# Patient Record
Sex: Female | Born: 1935 | Race: White | Hispanic: No | Smoking: Never smoker
Health system: Southern US, Community
[De-identification: ages and names within clinical notes are randomized; demographics above are authoritative.]

## PROBLEM LIST (undated history)

## (undated) DIAGNOSIS — M810 Age-related osteoporosis without current pathological fracture: Secondary | ICD-10-CM

## (undated) DIAGNOSIS — I639 Cerebral infarction, unspecified: Secondary | ICD-10-CM

## (undated) DIAGNOSIS — Z9289 Personal history of other medical treatment: Secondary | ICD-10-CM

## (undated) DIAGNOSIS — F419 Anxiety disorder, unspecified: Secondary | ICD-10-CM

## (undated) DIAGNOSIS — M1990 Unspecified osteoarthritis, unspecified site: Secondary | ICD-10-CM

## (undated) DIAGNOSIS — C801 Malignant (primary) neoplasm, unspecified: Secondary | ICD-10-CM

## (undated) DIAGNOSIS — M459 Ankylosing spondylitis of unspecified sites in spine: Secondary | ICD-10-CM

## (undated) HISTORY — PX: ABDOMINAL HYSTERECTOMY: SHX81

## (undated) HISTORY — DX: Cerebral infarction, unspecified: I63.9

---

## 2009-04-25 ENCOUNTER — Emergency Department (HOSPITAL_COMMUNITY): Admission: EM | Admit: 2009-04-25 | Discharge: 2009-04-26 | Payer: Self-pay | Admitting: Emergency Medicine

## 2013-10-05 ENCOUNTER — Emergency Department (HOSPITAL_COMMUNITY): Payer: No Typology Code available for payment source

## 2013-10-05 ENCOUNTER — Encounter (HOSPITAL_COMMUNITY): Payer: Self-pay | Admitting: Emergency Medicine

## 2013-10-05 ENCOUNTER — Inpatient Hospital Stay (HOSPITAL_COMMUNITY)
Admission: EM | Admit: 2013-10-05 | Discharge: 2013-10-12 | DRG: 459 | Disposition: A | Discharge status: Rehabilitation Facility | Payer: No Typology Code available for payment source | Attending: General Surgery | Admitting: General Surgery

## 2013-10-05 DIAGNOSIS — M545 Low back pain, unspecified: Secondary | ICD-10-CM | POA: Diagnosis not present

## 2013-10-05 DIAGNOSIS — Z79899 Other long term (current) drug therapy: Secondary | ICD-10-CM

## 2013-10-05 DIAGNOSIS — S32009A Unspecified fracture of unspecified lumbar vertebra, initial encounter for closed fracture: Secondary | ICD-10-CM

## 2013-10-05 DIAGNOSIS — S065X9A Traumatic subdural hemorrhage with loss of consciousness of unspecified duration, initial encounter: Secondary | ICD-10-CM

## 2013-10-05 DIAGNOSIS — K9189 Other postprocedural complications and disorders of digestive system: Secondary | ICD-10-CM

## 2013-10-05 DIAGNOSIS — Y92410 Unspecified street and highway as the place of occurrence of the external cause: Secondary | ICD-10-CM

## 2013-10-05 DIAGNOSIS — K567 Ileus, unspecified: Secondary | ICD-10-CM | POA: Diagnosis not present

## 2013-10-05 DIAGNOSIS — R000 Tachycardia, unspecified: Secondary | ICD-10-CM | POA: Diagnosis not present

## 2013-10-05 DIAGNOSIS — S065XAA Traumatic subdural hemorrhage with loss of consciousness status unknown, initial encounter: Secondary | ICD-10-CM

## 2013-10-05 DIAGNOSIS — M459 Ankylosing spondylitis of unspecified sites in spine: Secondary | ICD-10-CM | POA: Diagnosis present

## 2013-10-05 DIAGNOSIS — K560 Paralytic ileus: Secondary | ICD-10-CM | POA: Diagnosis not present

## 2013-10-05 DIAGNOSIS — S129XXA Fracture of neck, unspecified, initial encounter: Secondary | ICD-10-CM

## 2013-10-05 DIAGNOSIS — R339 Retention of urine, unspecified: Secondary | ICD-10-CM | POA: Diagnosis not present

## 2013-10-05 DIAGNOSIS — S065X0A Traumatic subdural hemorrhage without loss of consciousness, initial encounter: Secondary | ICD-10-CM | POA: Diagnosis present

## 2013-10-05 DIAGNOSIS — S32029A Unspecified fracture of second lumbar vertebra, initial encounter for closed fracture: Secondary | ICD-10-CM | POA: Diagnosis present

## 2013-10-05 DIAGNOSIS — D62 Acute posthemorrhagic anemia: Secondary | ICD-10-CM | POA: Diagnosis not present

## 2013-10-05 DIAGNOSIS — S32019A Unspecified fracture of first lumbar vertebra, initial encounter for closed fracture: Secondary | ICD-10-CM | POA: Diagnosis present

## 2013-10-05 DIAGNOSIS — J9600 Acute respiratory failure, unspecified whether with hypoxia or hypercapnia: Secondary | ICD-10-CM | POA: Diagnosis present

## 2013-10-05 HISTORY — DX: Ankylosing spondylitis of unspecified sites in spine: M45.9

## 2013-10-05 MED ORDER — MORPHINE SULFATE 4 MG/ML IJ SOLN
4.0000 mg | INTRAMUSCULAR | Status: AC | PRN
  Administered 2013-10-06 (×2): 4 mg via INTRAVENOUS
  Filled 2013-10-05 (×2): qty 1

## 2013-10-05 MED ORDER — IOHEXOL 300 MG/ML  SOLN: 100.0000 mL | Freq: Once | INTRAMUSCULAR | Status: AC | PRN

## 2013-10-05 NOTE — ED Notes (Signed)
Dr.Knapp at bedside  
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
## 2013-10-06 ENCOUNTER — Emergency Department (HOSPITAL_COMMUNITY): Payer: No Typology Code available for payment source

## 2013-10-06 ENCOUNTER — Inpatient Hospital Stay (HOSPITAL_COMMUNITY): Payer: No Typology Code available for payment source | Admitting: Anesthesiology

## 2013-10-06 ENCOUNTER — Inpatient Hospital Stay (HOSPITAL_COMMUNITY): Payer: No Typology Code available for payment source

## 2013-10-06 ENCOUNTER — Encounter (HOSPITAL_COMMUNITY): Payer: No Typology Code available for payment source | Admitting: Anesthesiology

## 2013-10-06 ENCOUNTER — Encounter (HOSPITAL_COMMUNITY): Payer: Self-pay

## 2013-10-06 ENCOUNTER — Encounter (HOSPITAL_COMMUNITY): Admission: EM | Disposition: A | Discharge status: Rehabilitation Facility | Payer: Self-pay | Source: Home / Self Care

## 2013-10-06 DIAGNOSIS — I10 Essential (primary) hypertension: Secondary | ICD-10-CM | POA: Diagnosis not present

## 2013-10-06 DIAGNOSIS — M459 Ankylosing spondylitis of unspecified sites in spine: Secondary | ICD-10-CM | POA: Diagnosis not present

## 2013-10-06 DIAGNOSIS — M545 Low back pain, unspecified: Secondary | ICD-10-CM | POA: Diagnosis present

## 2013-10-06 DIAGNOSIS — D62 Acute posthemorrhagic anemia: Secondary | ICD-10-CM | POA: Diagnosis not present

## 2013-10-06 DIAGNOSIS — S065X9A Traumatic subdural hemorrhage with loss of consciousness of unspecified duration, initial encounter: Secondary | ICD-10-CM

## 2013-10-06 DIAGNOSIS — S129XXA Fracture of neck, unspecified, initial encounter: Secondary | ICD-10-CM

## 2013-10-06 DIAGNOSIS — Y92410 Unspecified street and highway as the place of occurrence of the external cause: Secondary | ICD-10-CM | POA: Diagnosis not present

## 2013-10-06 DIAGNOSIS — S32009A Unspecified fracture of unspecified lumbar vertebra, initial encounter for closed fracture: Secondary | ICD-10-CM | POA: Diagnosis present

## 2013-10-06 DIAGNOSIS — Z79899 Other long term (current) drug therapy: Secondary | ICD-10-CM | POA: Diagnosis not present

## 2013-10-06 DIAGNOSIS — R000 Tachycardia, unspecified: Secondary | ICD-10-CM | POA: Diagnosis not present

## 2013-10-06 DIAGNOSIS — F411 Generalized anxiety disorder: Secondary | ICD-10-CM | POA: Diagnosis not present

## 2013-10-06 DIAGNOSIS — D72829 Elevated white blood cell count, unspecified: Secondary | ICD-10-CM | POA: Diagnosis not present

## 2013-10-06 DIAGNOSIS — S065X0A Traumatic subdural hemorrhage without loss of consciousness, initial encounter: Secondary | ICD-10-CM | POA: Diagnosis present

## 2013-10-06 DIAGNOSIS — Z5189 Encounter for other specified aftercare: Secondary | ICD-10-CM | POA: Diagnosis present

## 2013-10-06 DIAGNOSIS — K560 Paralytic ileus: Secondary | ICD-10-CM | POA: Diagnosis not present

## 2013-10-06 DIAGNOSIS — S065XAA Traumatic subdural hemorrhage with loss of consciousness status unknown, initial encounter: Secondary | ICD-10-CM | POA: Diagnosis not present

## 2013-10-06 DIAGNOSIS — R339 Retention of urine, unspecified: Secondary | ICD-10-CM | POA: Diagnosis not present

## 2013-10-06 DIAGNOSIS — M62838 Other muscle spasm: Secondary | ICD-10-CM | POA: Diagnosis not present

## 2013-10-06 DIAGNOSIS — G5790 Unspecified mononeuropathy of unspecified lower limb: Secondary | ICD-10-CM | POA: Diagnosis not present

## 2013-10-06 HISTORY — PX: POSTERIOR LUMBAR FUSION: SHX6036

## 2013-10-06 HISTORY — PX: CRANIOTOMY: SHX93

## 2013-10-06 LAB — CBC
HCT: 36.4 % (ref 36.0–46.0)
HEMOGLOBIN: 12.1 g/dL (ref 12.0–15.0)
MCH: 29.4 pg (ref 26.0–34.0)
MCHC: 33.2 g/dL (ref 30.0–36.0)
MCV: 88.3 fL (ref 78.0–100.0)
Platelets: 225 10*3/uL (ref 150–400)
RBC: 4.12 MIL/uL (ref 3.87–5.11)
RDW: 14.3 % (ref 11.5–15.5)
WBC: 14.6 10*3/uL — AB (ref 4.0–10.5)

## 2013-10-06 LAB — URINALYSIS, ROUTINE W REFLEX MICROSCOPIC
Bilirubin Urine: NEGATIVE
Glucose, UA: NEGATIVE mg/dL
Hgb urine dipstick: NEGATIVE
Ketones, ur: NEGATIVE mg/dL
LEUKOCYTES UA: NEGATIVE
NITRITE: NEGATIVE
PH: 6 (ref 5.0–8.0)
Protein, ur: NEGATIVE mg/dL
Specific Gravity, Urine: 1.024 (ref 1.005–1.030)
Urobilinogen, UA: 0.2 mg/dL (ref 0.0–1.0)

## 2013-10-06 LAB — CBC WITH DIFFERENTIAL/PLATELET
Basophils Absolute: 0.1 10*3/uL (ref 0.0–0.1)
Basophils Relative: 1 % (ref 0–1)
EOS PCT: 1 % (ref 0–5)
Eosinophils Absolute: 0.1 10*3/uL (ref 0.0–0.7)
HCT: 42.1 % (ref 36.0–46.0)
Hemoglobin: 14.1 g/dL (ref 12.0–15.0)
LYMPHS PCT: 33 % (ref 12–46)
Lymphs Abs: 4.1 10*3/uL — ABNORMAL HIGH (ref 0.7–4.0)
MCH: 29.1 pg (ref 26.0–34.0)
MCHC: 33.5 g/dL (ref 30.0–36.0)
MCV: 86.8 fL (ref 78.0–100.0)
Monocytes Absolute: 0.9 10*3/uL (ref 0.1–1.0)
Monocytes Relative: 7 % (ref 3–12)
NEUTROS PCT: 58 % (ref 43–77)
Neutro Abs: 7.2 10*3/uL (ref 1.7–7.7)
PLATELETS: 269 10*3/uL (ref 150–400)
RBC: 4.85 MIL/uL (ref 3.87–5.11)
RDW: 14.2 % (ref 11.5–15.5)
WBC: 12.3 10*3/uL — AB (ref 4.0–10.5)

## 2013-10-06 LAB — POCT I-STAT 3, ART BLOOD GAS (G3+)
ACID-BASE DEFICIT: 4 mmol/L — AB (ref 0.0–2.0)
BICARBONATE: 23 meq/L (ref 20.0–24.0)
O2 Saturation: 99 %
PH ART: 7.264 — AB (ref 7.350–7.450)
TCO2: 25 mmol/L (ref 0–100)
pCO2 arterial: 50.8 mmHg — ABNORMAL HIGH (ref 35.0–45.0)
pO2, Arterial: 153 mmHg — ABNORMAL HIGH (ref 80.0–100.0)

## 2013-10-06 LAB — I-STAT CHEM 8, ED
BUN: 26 mg/dL — ABNORMAL HIGH (ref 6–23)
Calcium, Ion: 1.08 mmol/L — ABNORMAL LOW (ref 1.13–1.30)
Chloride: 102 mEq/L (ref 96–112)
Creatinine, Ser: 0.8 mg/dL (ref 0.50–1.10)
Glucose, Bld: 202 mg/dL — ABNORMAL HIGH (ref 70–99)
HCT: 45 % (ref 36.0–46.0)
Hemoglobin: 15.3 g/dL — ABNORMAL HIGH (ref 12.0–15.0)
POTASSIUM: 3.4 meq/L — AB (ref 3.7–5.3)
SODIUM: 139 meq/L (ref 137–147)
TCO2: 25 mmol/L (ref 0–100)

## 2013-10-06 LAB — BASIC METABOLIC PANEL
ANION GAP: 12 (ref 5–15)
BUN: 19 mg/dL (ref 6–23)
CALCIUM: 7 mg/dL — AB (ref 8.4–10.5)
CHLORIDE: 108 meq/L (ref 96–112)
CO2: 22 mEq/L (ref 19–32)
Creatinine, Ser: 0.49 mg/dL — ABNORMAL LOW (ref 0.50–1.10)
GFR calc Af Amer: >90 mL/min (ref >90)
GFR calc non Af Amer: >90 mL/min (ref >90)
GLUCOSE: 198 mg/dL — AB (ref 70–99)
Potassium: 4.5 mEq/L (ref 3.7–5.3)
SODIUM: 142 meq/L (ref 137–147)

## 2013-10-06 LAB — TYPE AND SCREEN
ABO/RH(D): AB NEG
Antibody Screen: NEGATIVE

## 2013-10-06 LAB — MRSA PCR SCREENING
MRSA BY PCR: NEGATIVE
MRSA by PCR: NEGATIVE

## 2013-10-06 LAB — APTT: aPTT: 22 seconds — ABNORMAL LOW (ref 24–37)

## 2013-10-06 LAB — PROTIME-INR
INR: 0.89 (ref 0.00–1.49)
Prothrombin Time: 12.1 seconds (ref 11.6–15.2)

## 2013-10-06 SURGERY — CRANIOTOMY HEMATOMA EVACUATION SUBDURAL
Anesthesia: General | Site: Spine Lumbar | Laterality: Right

## 2013-10-06 MED ORDER — PANTOPRAZOLE SODIUM 40 MG PO TBEC
40.0000 mg | DELAYED_RELEASE_TABLET | Freq: Every day | ORAL | Status: DC
  Administered 2013-10-10 – 2013-10-11 (×2): 40 mg via ORAL
  Filled 2013-10-06 (×2): qty 1

## 2013-10-06 MED ORDER — ONDANSETRON HCL 4 MG/2ML IJ SOLN: 4.0000 mg | Freq: Four times a day (QID) | INTRAMUSCULAR | Status: DC | PRN

## 2013-10-06 MED ORDER — SUCCINYLCHOLINE CHLORIDE 20 MG/ML IJ SOLN
INTRAMUSCULAR | Status: DC | PRN
  Administered 2013-10-06: 140 mg via INTRAVENOUS

## 2013-10-06 MED ORDER — ALBUMIN HUMAN 5 % IV SOLN
INTRAVENOUS | Status: DC | PRN
  Administered 2013-10-06: 07:00:00 via INTRAVENOUS

## 2013-10-06 MED ORDER — SODIUM CHLORIDE 0.9 % IJ SOLN: 3.0000 mL | INTRAMUSCULAR | Status: DC | PRN

## 2013-10-06 MED ORDER — MORPHINE SULFATE 2 MG/ML IJ SOLN
1.0000 mg | INTRAMUSCULAR | Status: DC | PRN
  Administered 2013-10-06: 1 mg via INTRAVENOUS
  Administered 2013-10-07: 2 mg via INTRAVENOUS
  Administered 2013-10-07: 1 mg via INTRAVENOUS
  Filled 2013-10-06 (×3): qty 1

## 2013-10-06 MED ORDER — PROPOFOL 10 MG/ML IV BOLUS
INTRAVENOUS | Status: DC | PRN
  Administered 2013-10-06 (×2): 50 mg via INTRAVENOUS

## 2013-10-06 MED ORDER — 0.9 % SODIUM CHLORIDE (POUR BTL) OPTIME
TOPICAL | Status: DC | PRN
  Administered 2013-10-06 (×3): 1000 mL

## 2013-10-06 MED ORDER — NEOSTIGMINE METHYLSULFATE 10 MG/10ML IV SOLN
INTRAVENOUS | Status: DC | PRN
  Administered 2013-10-06: 4 mg via INTRAVENOUS

## 2013-10-06 MED ORDER — PHENYLEPHRINE HCL 10 MG/ML IJ SOLN
10.0000 mg | INTRAVENOUS | Status: DC | PRN
  Administered 2013-10-06: 25 ug/min via INTRAVENOUS

## 2013-10-06 MED ORDER — THROMBIN 20000 UNITS EX SOLR
CUTANEOUS | Status: DC | PRN
  Administered 2013-10-06 (×2): via TOPICAL

## 2013-10-06 MED ORDER — DEXAMETHASONE SODIUM PHOSPHATE 4 MG/ML IJ SOLN
INTRAMUSCULAR | Status: DC | PRN
  Administered 2013-10-06: 8 mg via INTRAVENOUS

## 2013-10-06 MED ORDER — ETOMIDATE 2 MG/ML IV SOLN
INTRAVENOUS | Status: DC | PRN
  Administered 2013-10-06: 20 mg via INTRAVENOUS

## 2013-10-06 MED ORDER — ONDANSETRON HCL 4 MG/2ML IJ SOLN: 4.0000 mg | INTRAMUSCULAR | Status: DC | PRN

## 2013-10-06 MED ORDER — FENTANYL CITRATE 0.05 MG/ML IJ SOLN
INTRAMUSCULAR | Status: DC | PRN
  Administered 2013-10-06 (×2): 100 ug via INTRAVENOUS
  Administered 2013-10-06: 50 ug via INTRAVENOUS
  Administered 2013-10-06: 100 ug via INTRAVENOUS
  Administered 2013-10-06: 150 ug via INTRAVENOUS

## 2013-10-06 MED ORDER — POTASSIUM CHLORIDE IN NACL 20-0.9 MEQ/L-% IV SOLN
INTRAVENOUS | Status: DC
  Administered 2013-10-06 – 2013-10-10 (×6): via INTRAVENOUS
  Filled 2013-10-06 (×9): qty 1000

## 2013-10-06 MED ORDER — IOHEXOL 300 MG/ML  SOLN
100.0000 mL | Freq: Once | INTRAMUSCULAR | Status: AC | PRN
  Administered 2013-10-06: 100 mL via INTRAVENOUS

## 2013-10-06 MED ORDER — BACITRACIN 50000 UNITS IM SOLR
INTRAMUSCULAR | Status: DC | PRN
  Administered 2013-10-06 (×2)

## 2013-10-06 MED ORDER — CEFAZOLIN SODIUM-DEXTROSE 2-3 GM-% IV SOLR
INTRAVENOUS | Status: DC | PRN
  Administered 2013-10-06: 2 g via INTRAVENOUS

## 2013-10-06 MED ORDER — MICROFIBRILLAR COLL HEMOSTAT EX PADS
MEDICATED_PAD | CUTANEOUS | Status: DC | PRN
  Administered 2013-10-06: 1 via TOPICAL

## 2013-10-06 MED ORDER — SODIUM CHLORIDE 0.9 % IV SOLN: 250.0000 mL | INTRAVENOUS | Status: DC

## 2013-10-06 MED ORDER — VECURONIUM BROMIDE 10 MG IV SOLR
INTRAVENOUS | Status: DC | PRN
  Administered 2013-10-06 (×3): 5 mg via INTRAVENOUS

## 2013-10-06 MED ORDER — GLYCOPYRROLATE 0.2 MG/ML IJ SOLN
INTRAMUSCULAR | Status: DC | PRN
  Administered 2013-10-06: .8 mg via INTRAVENOUS

## 2013-10-06 MED ORDER — LIDOCAINE HCL (CARDIAC) 20 MG/ML IV SOLN
INTRAVENOUS | Status: DC | PRN
  Administered 2013-10-06: 80 mg via INTRAVENOUS

## 2013-10-06 MED ORDER — CHLORHEXIDINE GLUCONATE 0.12 % MT SOLN
15.0000 mL | Freq: Two times a day (BID) | OROMUCOSAL | Status: DC
  Administered 2013-10-06: 15 mL via OROMUCOSAL

## 2013-10-06 MED ORDER — DEXMEDETOMIDINE HCL IN NACL 200 MCG/50ML IV SOLN
INTRAVENOUS | Status: DC | PRN
  Administered 2013-10-06: 0.5 ug/kg/h via INTRAVENOUS

## 2013-10-06 MED ORDER — DEXTROSE 5 % IV SOLN
INTRAVENOUS | Status: DC | PRN
  Administered 2013-10-06: 05:00:00 via INTRAVENOUS

## 2013-10-06 MED ORDER — ONDANSETRON HCL 4 MG PO TABS
4.0000 mg | ORAL_TABLET | Freq: Four times a day (QID) | ORAL | Status: DC | PRN
Start: 2013-10-06 — End: 2013-10-07

## 2013-10-06 MED ORDER — SODIUM CHLORIDE 0.9 % IV SOLN
INTRAVENOUS | Status: DC | PRN
  Administered 2013-10-06: 04:00:00 via INTRAVENOUS

## 2013-10-06 MED ORDER — ALUM & MAG HYDROXIDE-SIMETH 200-200-20 MG/5ML PO SUSP: 30.0000 mL | Freq: Four times a day (QID) | ORAL | Status: DC | PRN

## 2013-10-06 MED ORDER — CHLORHEXIDINE GLUCONATE 0.12 % MT SOLN
OROMUCOSAL | Status: AC
  Filled 2013-10-06: qty 15

## 2013-10-06 MED ORDER — ACETAMINOPHEN 650 MG RE SUPP
650.0000 mg | RECTAL | Status: DC | PRN
  Administered 2013-10-06: 650 mg via RECTAL
  Filled 2013-10-06: qty 1

## 2013-10-06 MED ORDER — BUPIVACAINE HCL (PF) 0.5 % IJ SOLN
INTRAMUSCULAR | Status: DC | PRN
  Administered 2013-10-06 (×2): 5 mL

## 2013-10-06 MED ORDER — LIDOCAINE-EPINEPHRINE 1 %-1:100000 IJ SOLN
INTRAMUSCULAR | Status: DC | PRN
  Administered 2013-10-06 (×2): 5 mL via INTRADERMAL

## 2013-10-06 MED ORDER — MENTHOL 3 MG MT LOZG: 1.0000 | LOZENGE | OROMUCOSAL | Status: DC | PRN

## 2013-10-06 MED ORDER — LEVETIRACETAM IN NACL 500 MG/100ML IV SOLN
500.0000 mg | Freq: Once | INTRAVENOUS | Status: AC
  Administered 2013-10-06: 500 mg via INTRAVENOUS
  Filled 2013-10-06: qty 100

## 2013-10-06 MED ORDER — PANTOPRAZOLE SODIUM 40 MG IV SOLR
40.0000 mg | Freq: Every day | INTRAVENOUS | Status: DC
  Administered 2013-10-06 – 2013-10-09 (×4): 40 mg via INTRAVENOUS
  Filled 2013-10-06 (×5): qty 40

## 2013-10-06 MED ORDER — DEXMEDETOMIDINE HCL IN NACL 200 MCG/50ML IV SOLN
0.4000 ug/kg/h | INTRAVENOUS | Status: DC
  Filled 2013-10-06: qty 50

## 2013-10-06 MED ORDER — SODIUM CHLORIDE 0.9 % IV SOLN
INTRAVENOUS | Status: DC | PRN
  Administered 2013-10-06: 05:00:00 via INTRAVENOUS

## 2013-10-06 MED ORDER — HYDROMORPHONE HCL PF 1 MG/ML IJ SOLN
0.5000 mg | INTRAMUSCULAR | Status: DC | PRN
  Administered 2013-10-06 – 2013-10-07 (×5): 0.5 mg via INTRAVENOUS
  Filled 2013-10-06 (×6): qty 1

## 2013-10-06 MED ORDER — PHENOL 1.4 % MT LIQD: 1.0000 | OROMUCOSAL | Status: DC | PRN

## 2013-10-06 MED ORDER — ACETAMINOPHEN 325 MG PO TABS: 650.0000 mg | ORAL_TABLET | ORAL | Status: DC | PRN

## 2013-10-06 MED ORDER — CETYLPYRIDINIUM CHLORIDE 0.05 % MT LIQD: 7.0000 mL | Freq: Four times a day (QID) | OROMUCOSAL | Status: DC

## 2013-10-06 MED ORDER — OXYCODONE-ACETAMINOPHEN 5-325 MG PO TABS: 1.0000 | ORAL_TABLET | ORAL | Status: DC | PRN

## 2013-10-06 MED ORDER — CEFAZOLIN SODIUM 1-5 GM-% IV SOLN
1.0000 g | Freq: Three times a day (TID) | INTRAVENOUS | Status: AC
  Administered 2013-10-06 (×2): 1 g via INTRAVENOUS
  Filled 2013-10-06 (×2): qty 50

## 2013-10-06 MED ORDER — SODIUM CHLORIDE 0.9 % IJ SOLN
3.0000 mL | Freq: Two times a day (BID) | INTRAMUSCULAR | Status: DC
  Administered 2013-10-06 – 2013-10-09 (×5): 3 mL via INTRAVENOUS

## 2013-10-06 MED ORDER — ARTIFICIAL TEARS OP OINT
TOPICAL_OINTMENT | OPHTHALMIC | Status: DC | PRN
  Administered 2013-10-06: 1 via OPHTHALMIC

## 2013-10-06 MED ORDER — ONDANSETRON HCL 4 MG/2ML IJ SOLN
INTRAMUSCULAR | Status: DC | PRN
  Administered 2013-10-06: 4 mg via INTRAVENOUS

## 2013-10-06 SURGICAL SUPPLY — 96 items
ADH SKN CLS APL DERMABOND .7 (GAUZE/BANDAGES/DRESSINGS) ×2
ADH SKN CLS LQ APL DERMABOND (GAUZE/BANDAGES/DRESSINGS) ×2
BAG DECANTER FOR FLEXI CONT (MISCELLANEOUS) ×8 IMPLANT
BIT DRILL WIRE PASS 1.3MM (BIT) IMPLANT
BNDG GAUZE ELAST 4 BULKY (GAUZE/BANDAGES/DRESSINGS) ×2 IMPLANT
BRUSH SCRUB EZ PLAIN DRY (MISCELLANEOUS) ×2 IMPLANT
BUR ACORN 6.0 (BURR) ×3 IMPLANT
BUR ACORN 6.0MM (BURR) ×1
BUR ADDG 1.1 (BURR) IMPLANT
BUR ADDG 1.1MM (BURR)
BUR ROUTER D-58 CRANI (BURR) ×2 IMPLANT
CANISTER SUCT 3000ML (MISCELLANEOUS) ×4 IMPLANT
CAP REVERE LOCKING (Cap) ×8 IMPLANT
CLIP TI MEDIUM 6 (CLIP) IMPLANT
CONT SPEC 4OZ CLIKSEAL STRL BL (MISCELLANEOUS) ×4 IMPLANT
CONT SPEC STER OR (MISCELLANEOUS) ×6 IMPLANT
CORDS BIPOLAR (ELECTRODE) ×4 IMPLANT
COVER TABLE BACK 60X90 (DRAPES) ×2 IMPLANT
DECANTER SPIKE VIAL GLASS SM (MISCELLANEOUS) ×4 IMPLANT
DERMABOND ADHESIVE PROPEN (GAUZE/BANDAGES/DRESSINGS) ×2
DERMABOND ADVANCED (GAUZE/BANDAGES/DRESSINGS) ×2
DERMABOND ADVANCED .7 DNX12 (GAUZE/BANDAGES/DRESSINGS) IMPLANT
DERMABOND ADVANCED .7 DNX6 (GAUZE/BANDAGES/DRESSINGS) IMPLANT
DRAIN CHANNEL 10M FLAT 3/4 FLT (DRAIN) IMPLANT
DRAIN PENROSE 1/2X12 LTX STRL (WOUND CARE) IMPLANT
DRAPE C-ARM 42X72 X-RAY (DRAPES) ×3 IMPLANT
DRAPE C-ARMOR (DRAPES) ×2 IMPLANT
DRAPE LAPAROTOMY 100X72X124 (DRAPES) ×2 IMPLANT
DRAPE POUCH INSTRU U-SHP 10X18 (DRAPES) ×2 IMPLANT
DRAPE SURG IRRIG POUCH 19X23 (DRAPES) IMPLANT
DRAPE WARM FLUID 44X44 (DRAPE) ×4 IMPLANT
DRILL WIRE PASS 1.3MM (BIT)
DRSG ADAPTIC 3X8 NADH LF (GAUZE/BANDAGES/DRESSINGS) IMPLANT
DRSG OPSITE POSTOP 3X4 (GAUZE/BANDAGES/DRESSINGS) ×3 IMPLANT
DRSG OPSITE POSTOP 4X10 (GAUZE/BANDAGES/DRESSINGS) ×4 IMPLANT
DRSG OPSITE POSTOP 4X6 (GAUZE/BANDAGES/DRESSINGS) ×3 IMPLANT
DURAPREP 26ML APPLICATOR (WOUND CARE) ×6 IMPLANT
DURAPREP 6ML APPLICATOR 50/CS (WOUND CARE) ×4 IMPLANT
ELECT CAUTERY BLADE 6.4 (BLADE) ×4 IMPLANT
ELECT REM PT RETURN 9FT ADLT (ELECTROSURGICAL) ×4
ELECTRODE REM PT RTRN 9FT ADLT (ELECTROSURGICAL) ×2 IMPLANT
EVACUATOR SILICONE 100CC (DRAIN) IMPLANT
GAUZE SPONGE 4X4 12PLY STRL (GAUZE/BANDAGES/DRESSINGS) IMPLANT
GAUZE SPONGE 4X4 16PLY XRAY LF (GAUZE/BANDAGES/DRESSINGS) IMPLANT
GLOVE BIO SURGEON STRL SZ 6.5 (GLOVE) ×1 IMPLANT
GLOVE BIO SURGEON STRL SZ7 (GLOVE) ×6 IMPLANT
GLOVE BIO SURGEON STRL SZ7.5 (GLOVE) IMPLANT
GLOVE BIO SURGEONS STRL SZ 6.5 (GLOVE) ×1
GLOVE BIOGEL PI IND STRL 7.5 (GLOVE) ×4 IMPLANT
GLOVE BIOGEL PI IND STRL 8.5 (GLOVE) ×2 IMPLANT
GLOVE BIOGEL PI INDICATOR 7.5 (GLOVE) ×4
GLOVE BIOGEL PI INDICATOR 8.5 (GLOVE) ×4
GLOVE ECLIPSE 8.5 STRL (GLOVE) ×6 IMPLANT
GLOVE EXAM NITRILE LRG STRL (GLOVE) IMPLANT
GLOVE EXAM NITRILE MD LF STRL (GLOVE) IMPLANT
GLOVE EXAM NITRILE XL STR (GLOVE) IMPLANT
GLOVE EXAM NITRILE XS STR PU (GLOVE) IMPLANT
GOWN STRL REUS W/ TWL LRG LVL3 (GOWN DISPOSABLE) IMPLANT
GOWN STRL REUS W/ TWL XL LVL3 (GOWN DISPOSABLE) ×2 IMPLANT
GOWN STRL REUS W/TWL 2XL LVL3 (GOWN DISPOSABLE) ×4 IMPLANT
GOWN STRL REUS W/TWL LRG LVL3 (GOWN DISPOSABLE) ×12
GOWN STRL REUS W/TWL XL LVL3 (GOWN DISPOSABLE) ×8
HEMOSTAT SURGICEL 2X14 (HEMOSTASIS) IMPLANT
HOOK DURA (MISCELLANEOUS) ×4 IMPLANT
KIT BASIN OR (CUSTOM PROCEDURE TRAY) ×6 IMPLANT
KIT ROOM TURNOVER OR (KITS) ×4 IMPLANT
NEEDLE HYPO 22GX1.5 SAFETY (NEEDLE) ×8 IMPLANT
NS IRRIG 1000ML POUR BTL (IV SOLUTION) ×4 IMPLANT
PACK CRANIOTOMY (CUSTOM PROCEDURE TRAY) ×4 IMPLANT
PACK LAMINECTOMY NEURO (CUSTOM PROCEDURE TRAY) ×3 IMPLANT
PAD ABD 8X10 STRL (GAUZE/BANDAGES/DRESSINGS) IMPLANT
PAD ARMBOARD 7.5X6 YLW CONV (MISCELLANEOUS) ×18 IMPLANT
PATTIES SURGICAL .5 X.5 (GAUZE/BANDAGES/DRESSINGS) IMPLANT
PATTIES SURGICAL .5 X3 (DISPOSABLE) IMPLANT
PATTIES SURGICAL 1X1 (DISPOSABLE) IMPLANT
PIN MAYFIELD SKULL DISP (PIN) IMPLANT
ROD STRAIGHT 6.35X40MM (Rod) ×4 IMPLANT
SCREW REVERE 6.35 6.5X40MM (Screw) ×8 IMPLANT
SPECIMEN JAR SMALL (MISCELLANEOUS) IMPLANT
SPONGE NEURO XRAY DETECT 1X3 (DISPOSABLE) IMPLANT
SPONGE SURGIFOAM ABS GEL 100 (HEMOSTASIS) IMPLANT
STAPLER SKIN PROX WIDE 3.9 (STAPLE) ×6 IMPLANT
STRIP BIOACTIVE 10CC 25X50X8 (Miscellaneous) ×2 IMPLANT
SUT ETHILON 3 0 FSL (SUTURE) IMPLANT
SUT NURALON 4 0 TR CR/8 (SUTURE) ×8 IMPLANT
SUT VIC AB 2-0 CP2 18 (SUTURE) ×10 IMPLANT
SUT VIC AB 2-0 CT1 18 (SUTURE) ×2 IMPLANT
SUT VIC AB 3-0 SH 18 (SUTURE) ×4 IMPLANT
SYR 20ML ECCENTRIC (SYRINGE) ×4 IMPLANT
SYR CONTROL 10ML LL (SYRINGE) ×6 IMPLANT
TOWEL OR 17X24 6PK STRL BLUE (TOWEL DISPOSABLE) ×6 IMPLANT
TOWEL OR 17X26 10 PK STRL BLUE (TOWEL DISPOSABLE) ×7 IMPLANT
TRAP SPECIMEN MUCOUS 40CC (MISCELLANEOUS) IMPLANT
TRAY FOLEY CATH 14FRSI W/METER (CATHETERS) IMPLANT
UNDERPAD 30X30 INCONTINENT (UNDERPADS AND DIAPERS) IMPLANT
WATER STERILE IRR 1000ML POUR (IV SOLUTION) ×4 IMPLANT

## 2013-10-06 NOTE — Progress Notes (Signed)
10/06/2013- Resp care note- Pt suctioned and extubated at 1300 as per MD.  Dr Linna Caprice at bedside for procedure along with RN.  Pt able to follow commands.  Post extubation pt vocalizing.  AN19, rr17, sats 98% on 2lpm cannula.  Minimal to no secretions prior to extubation.  Ventilator and ambu bag at bedside. Pt tolerated procedure well. Will follow pt progress.

## 2013-10-06 NOTE — Plan of Care (Signed)
Problem: Consults Goal: Diagnosis - Craniotomy Outcome: Completed/Met Date Met:  10/06/13 Subdural hematoma

## 2013-10-06 NOTE — Transfer of Care (Signed)
Immediate Anesthesia Transfer of Care Note  Patient: Erica [name]  Procedure(s) Performed: Procedure(s): CRANIECTOMY HEMATOMA EVACUATION SUBDURAL, PLACEMENT OF SKULL FLAP IN ABDOMEN (Right) POSTERIOR LUMBAR FUSION  lumbar one/two (N/A)  Patient Location: ICU  Anesthesia Type:General  Level of Consciousness: Patient remains intubated per anesthesia plan  Airway & Oxygen Therapy: Patient remains intubated per anesthesia plan and Patient placed on Ventilator (see vital sign flow sheet for setting)  Post-op Assessment: Report given to PACU RN and Post -op Vital signs reviewed and stable  Post vital signs: Reviewed and stable  Complications: No apparent anesthesia complications
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
## 2013-10-06 NOTE — Anesthesia Preprocedure Evaluation (Signed)
Anesthesia Evaluation  Patient identified by MRN, date of birth, ID band Patient awake    Reviewed: Allergy & Precautions, H&P , NPO status , Patient's Chart, lab work & pertinent test results  History of Anesthesia Complications Negative for: history of anesthetic complications  Airway Mallampati: II TM Distance: >3 FB Neck ROM: Full    Dental  (+) Teeth Intact, Dental Advisory Given   Pulmonary neg pulmonary ROS,    Pulmonary exam normal       Cardiovascular negative cardio ROS      Neuro/Psych negative psych ROS   GI/Hepatic negative GI ROS, Neg liver ROS,   Endo/Other  negative endocrine ROS  Renal/GU negative Renal ROS     Musculoskeletal   Abdominal   Peds  Hematology negative hematology ROS (+)   Anesthesia Other Findings   Reproductive/Obstetrics                           Anesthesia Physical Anesthesia Plan  ASA: III and emergent  Anesthesia Plan: General   Post-op Pain Management:    Induction: Intravenous  Airway Management Planned: Oral ETT and Video Laryngoscope Planned  Additional Equipment: Arterial line  Intra-op Plan:   Post-operative Plan: Possible Post-op intubation/ventilation  Informed Consent: I have reviewed the patients History and Physical, chart, labs and discussed the procedure including the risks, benefits and alternatives for the proposed anesthesia with the patient or authorized representative who has indicated his/her understanding and acceptance.   Dental advisory given  Plan Discussed with: CRNA, Anesthesiologist and Surgeon  Anesthesia Plan Comments:         Anesthesia Quick Evaluation

## 2013-10-06 NOTE — Anesthesia Postprocedure Evaluation (Signed)
  Anesthesia Post-op Note  Patient: Erica [name]  Procedure(s) Performed: Procedure(s): CRANIECTOMY HEMATOMA EVACUATION SUBDURAL, PLACEMENT OF SKULL FLAP IN ABDOMEN (Right) POSTERIOR LUMBAR FUSION  lumbar one/two (N/A)  Patient Location: NICU  Anesthesia Type:General  Level of Consciousness: awake, sedated and Patient remains intubated per anesthesia plan  Airway and Oxygen Therapy: Patient remains intubated per anesthesia plan and Patient placed on Ventilator (see vital sign flow sheet for setting)  Post-op Pain: none  Post-op Assessment: Post-op Vital signs reviewed, Patient's Cardiovascular Status Stable, Respiratory Function Stable, Patent Airway and Pain level controlled  Post-op Vital Signs: stable  Last Vitals:  Filed Vitals:   10/06/13 0800  BP: 142/71  Pulse: 89  Temp:   Resp: 18    Complications: No apparent anesthesia complications
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
## 2013-10-06 NOTE — Progress Notes (Signed)
Patient has just returned to the ICU after craniectomy/craniotomy, SDH evacuation, and L1-2 stabilization by Dr. Ellene Route.  She has been very stable throughout the operation and should be extubated shortly.  Will check CXR and some labs.  This patient has been seen and I agree with the findings and treatment plan.  Kathryne Eriksson. Dahlia Bailiff, MD, [place] [phone] (pager) [phone] (direct pager) Trauma Surgeon

## 2013-10-06 NOTE — ED Notes (Signed)
[facility] called for transport. 
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
## 2013-10-06 NOTE — Anesthesia Procedure Notes (Signed)
Procedure Name: Intubation Date/Time: 10/06/2013 5:20 AM Performed by: Jacquiline Doe A Pre-anesthesia Checklist: Patient identified, Timeout performed, Emergency Drugs available, Suction available and Patient being monitored Patient Re-evaluated:Patient Re-evaluated prior to inductionOxygen Delivery Method: Circle system utilized Intubation Type: IV induction, Rapid sequence and Cricoid Pressure applied Laryngoscope Size: Mac Grade View: Grade I Tube type: Subglottic suction tube Tube size: 7.5 mm Number of attempts: 1 Airway Equipment and Method: Stylet and Video-laryngoscopy Placement Confirmation: ETT inserted through vocal cords under direct vision,  breath sounds checked- equal and bilateral and positive ETCO2 Secured at: 21 cm Tube secured with: Tape Dental Injury: Teeth and Oropharynx as per pre-operative assessment  Difficulty Due To: Difficulty was anticipated

## 2013-10-06 NOTE — ED Notes (Signed)
[facility] here to transport pt to [facility]. 

## 2013-10-06 NOTE — Progress Notes (Signed)
PT Cancellation Note  Patient Details Name: Erica [name] MRN: [id number] DOB: December 09, 1933   Cancelled Treatment:    Reason Eval/Treat Not Completed: Patient not medically ready, spoke with nsg will see in am.   Duncan Dull 10/06/2013, 2:44 PM Alben Deeds, [place] DPT  [phone]

## 2013-10-06 NOTE — ED Notes (Signed)
Family here to visit--- family was made aware of pt's current condition by Dr. Dorie Rank.

## 2013-10-07 DIAGNOSIS — M459 Ankylosing spondylitis of unspecified sites in spine: Secondary | ICD-10-CM | POA: Insufficient documentation

## 2013-10-07 DIAGNOSIS — S069X9A Unspecified intracranial injury with loss of consciousness of unspecified duration, initial encounter: Secondary | ICD-10-CM

## 2013-10-07 DIAGNOSIS — S069XAA Unspecified intracranial injury with loss of consciousness status unknown, initial encounter: Secondary | ICD-10-CM

## 2013-10-07 DIAGNOSIS — K567 Ileus, unspecified: Secondary | ICD-10-CM | POA: Diagnosis not present

## 2013-10-07 DIAGNOSIS — J9600 Acute respiratory failure, unspecified whether with hypoxia or hypercapnia: Secondary | ICD-10-CM | POA: Diagnosis present

## 2013-10-07 DIAGNOSIS — S32019A Unspecified fracture of first lumbar vertebra, initial encounter for closed fracture: Secondary | ICD-10-CM | POA: Diagnosis present

## 2013-10-07 DIAGNOSIS — K9189 Other postprocedural complications and disorders of digestive system: Secondary | ICD-10-CM

## 2013-10-07 DIAGNOSIS — S32029A Unspecified fracture of second lumbar vertebra, initial encounter for closed fracture: Secondary | ICD-10-CM | POA: Diagnosis present

## 2013-10-07 LAB — URINE CULTURE
Colony Count: NO GROWTH
Culture: NO GROWTH

## 2013-10-07 LAB — ABO/RH: ABO/RH(D): AB NEG

## 2013-10-07 MED ORDER — TRAMADOL HCL 50 MG PO TABS
50.0000 mg | ORAL_TABLET | Freq: Four times a day (QID) | ORAL | Status: DC | PRN
  Administered 2013-10-07: 100 mg via ORAL
  Filled 2013-10-07: qty 2

## 2013-10-07 MED ORDER — TRAMADOL HCL 50 MG PO TABS
100.0000 mg | ORAL_TABLET | Freq: Four times a day (QID) | ORAL | Status: DC
  Administered 2013-10-07 – 2013-10-08 (×6): 100 mg via ORAL
  Administered 2013-10-09: 50 mg via ORAL
  Administered 2013-10-09 – 2013-10-12 (×12): 100 mg via ORAL
  Filled 2013-10-07 (×19): qty 2

## 2013-10-07 MED ORDER — HYDROCODONE-ACETAMINOPHEN 10-325 MG PO TABS
0.5000 | ORAL_TABLET | ORAL | Status: DC | PRN
  Administered 2013-10-07: 1 via ORAL
  Administered 2013-10-07: 2 via ORAL
  Filled 2013-10-07 (×2): qty 2

## 2013-10-07 MED ORDER — MORPHINE SULFATE 2 MG/ML IJ SOLN
2.0000 mg | INTRAMUSCULAR | Status: DC | PRN
  Administered 2013-10-08 – 2013-10-10 (×7): 2 mg via INTRAVENOUS
  Filled 2013-10-07 (×8): qty 1

## 2013-10-07 MED ORDER — CHLORHEXIDINE GLUCONATE 0.12 % MT SOLN
15.0000 mL | Freq: Two times a day (BID) | OROMUCOSAL | Status: DC
  Administered 2013-10-07 – 2013-10-12 (×8): 15 mL via OROMUCOSAL
  Filled 2013-10-07 (×11): qty 15

## 2013-10-07 MED ORDER — CETYLPYRIDINIUM CHLORIDE 0.05 % MT LIQD
7.0000 mL | Freq: Two times a day (BID) | OROMUCOSAL | Status: DC
Start: 2013-10-08 — End: 2013-10-09
  Administered 2013-10-08 (×2): 7 mL via OROMUCOSAL
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
## 2013-10-07 NOTE — Progress Notes (Signed)
Orthopedic Tech Progress Note Patient Details:  Erica [name] 1933/08/02 [id number] Brace order completed by [employer]. Patient ID: Erica [name], female   DOB: May 06, 1933, 80 y.o.   MRN: [id number]   [name] 10/07/2013, 12:18 PM

## 2013-10-07 NOTE — Progress Notes (Signed)
Patient ID: Erica [name], female   DOB: 1933/04/15, 80 y.o.   MRN: [id number] General alert and awake. Minimal complaints of headache. Moderate back soreness area moderate back spasm. Will obtain lumbar corset mobilizing well.
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
## 2013-10-07 NOTE — Progress Notes (Signed)
Lumbar Corsett ordered. Can continue to mobilize without brace. Brace mainly used for comfort during mobilization.

## 2013-10-07 NOTE — Progress Notes (Signed)
Abdomen is moderately distended.  Hypoactive bowel sounds.  Would not want this patient to aspirate.  Will go slowly with PO intake.  Start to move towards Rehab.  Can transfer to SDU.  This patient has been seen and I agree with the findings and treatment plan.  Kathryne Eriksson. Dahlia Bailiff, MD, [place] [phone] (pager) [phone] (direct pager) Trauma Surgeon
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
## 2013-10-08 ENCOUNTER — Inpatient Hospital Stay (HOSPITAL_COMMUNITY): Payer: No Typology Code available for payment source

## 2013-10-08 DIAGNOSIS — E46 Unspecified protein-calorie malnutrition: Secondary | ICD-10-CM

## 2013-10-08 DIAGNOSIS — K560 Paralytic ileus: Secondary | ICD-10-CM

## 2013-10-08 MED ORDER — METOPROLOL TARTRATE 1 MG/ML IV SOLN
5.0000 mg | Freq: Once | INTRAVENOUS | Status: AC
  Administered 2013-10-08: 5 mg via INTRAVENOUS

## 2013-10-08 MED ORDER — HYDRALAZINE HCL 20 MG/ML IJ SOLN
5.0000 mg | Freq: Once | INTRAMUSCULAR | Status: AC
  Administered 2013-10-08: 5 mg via INTRAVENOUS

## 2013-10-08 MED ORDER — HYDRALAZINE HCL 20 MG/ML IJ SOLN
5.0000 mg | INTRAMUSCULAR | Status: DC | PRN
  Administered 2013-10-08 – 2013-10-10 (×3): 5 mg via INTRAVENOUS
  Administered 2013-10-12: 07:00:00 via INTRAVENOUS
  Filled 2013-10-08 (×5): qty 1

## 2013-10-08 MED ORDER — METOPROLOL TARTRATE 1 MG/ML IV SOLN
INTRAVENOUS | Status: AC
  Filled 2013-10-08: qty 5

## 2013-10-08 MED ORDER — METOPROLOL TARTRATE 1 MG/ML IV SOLN
5.0000 mg | Freq: Four times a day (QID) | INTRAVENOUS | Status: DC | PRN
  Administered 2013-10-08: 5 mg via INTRAVENOUS
  Filled 2013-10-08 (×2): qty 5

## 2013-10-08 MED ORDER — METOPROLOL TARTRATE 25 MG/10 ML ORAL SUSPENSION
12.5000 mg | Freq: Two times a day (BID) | ORAL | Status: DC
  Administered 2013-10-08 – 2013-10-12 (×9): 12.5 mg via ORAL
  Filled 2013-10-08 (×11): qty 5

## 2013-10-08 MED ORDER — BISACODYL 10 MG RE SUPP
10.0000 mg | Freq: Once | RECTAL | Status: AC
  Administered 2013-10-08: 10 mg via RECTAL
  Filled 2013-10-08: qty 1

## 2013-10-08 NOTE — Progress Notes (Signed)
X-rays demonstrates generalized bowel gas--ileus.  Kathryne Eriksson. Dahlia Bailiff, MD, [place] [phone] Trauma Surgeon

## 2013-10-08 NOTE — Progress Notes (Signed)
10/08/13 1530  Vitals  BP ! 168/82 mmHg  MAP (mmHg) 103  Pulse Rate ! 106  ECG Heart Rate ! 106  Resp 13  Oxygen Therapy  SpO2 95 %    Notified Silvestre Gunner of BP >160. An additional 5 mg of lopressor ordered. Will continue to monitor.
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
## 2013-10-08 NOTE — Progress Notes (Signed)
Patient ID: Erica [name], female   DOB: 06/24/1933, 80 y.o.   MRN: [id number] Patient is alert and oriented. Scalp incision is clean and dry, dressing removed.  Abdominal incision is clean and dry and dressing also removed. Back incision is clean and dry.  Lower extremity function appears intact to confrontational testing in bed. Scalp flap is pulsatile.  Patient needs to be mobilized. Okay for stat down. Lumbar corset when ambulatory. No need for cervical collar.

## 2013-10-08 NOTE — Progress Notes (Signed)
10/08/13 1415  Vitals  BP ! 163/89 mmHg  MAP (mmHg) 109  Pulse Rate ! 105  ECG Heart Rate ! 105  Resp 18  Oxygen Therapy  SpO2 96 %  Note  Observations notified Silvestre Gunner   BP continues to be >160. Notified Silvestre Gunner. 5 mg of hydralazine ordered. Will continue to monitor.
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
## 2013-10-08 NOTE — Progress Notes (Signed)
10/08/13 1200  Vitals  BP ! 173/85 mmHg  MAP (mmHg) 108  BP Location Right arm  BP Method Automatic  Patient Position (if appropriate) Lying  Pulse Rate 98  Pulse Rate Source Monitor  ECG Heart Rate 99  Resp ! 21  Oxygen Therapy  SpO2 95 %  O2 Device Nasal cannula  O2 Flow Rate (L/min) 2 L/min  Pulse Oximetry Type Continuous   Pt's BP continues to be high despite multiple doses of labetalol given (see MAR). Silvestre Gunner notified. Hydralazine ordered. Will continue to monitor.

## 2013-10-08 NOTE — Progress Notes (Signed)
Trauma Service Note  Subjective: Patient ia awake and alert.  Burping.  No distress.  No flatus  Objective: Vital signs in last 24 hours: Temp:  [97.4 F (36.3 C)-98.7 F (37.1 C)] 98.7 F (37.1 C) (08/13 0800) Pulse Rate:  [109-132] 116 (08/13 0800) Resp:  [9-24] 14 (08/13 0800) BP: (120-179)/(58-89) 172/81 mmHg (08/13 0800) SpO2:  [91 %-97 %] 94 % (08/13 0800)    Intake/Output from previous day: 08/12 0701 - 08/13 0700 In: 1850 [I.V.:1850] Out: 650 [Urine:650] Intake/Output this shift: Total I/O In: 75 [I.V.:75] Out: -   General: No acute distress.  Cannot void.  Will likely get Foley back  Lungs: Clear to auscultation.  Sats are okay.  Abd: Distended, active bowel sounds.  Extremities: No changes.  Neuro: Alert, awake.  Lab Results: CBC   Recent Labs  10/05/13 2356 10/06/13 0015 10/06/13 0930  WBC 12.3*  --  14.6*  HGB 14.1 15.3* 12.1  HCT 42.1 45.0 36.4  PLT 269  --  225   BMET  Recent Labs  10/06/13 0015 10/06/13 0930  NA 139 142  K 3.4* 4.5  CL 102 108  CO2  --  22  GLUCOSE 202* 198*  BUN 26* 19  CREATININE 0.80 0.49*  CALCIUM  --  7.0*   PT/INR  Recent Labs  10/05/13 2356  LABPROT 12.1  INR 0.89   ABG  Recent Labs  10/06/13 0914  PHART 7.264*  HCO3 23.0    Studies/Results: No results found.  Anti-infectives: Anti-infectives   Start     Dose/Rate Route Frequency Ordered Stop   10/06/13 1300  ceFAZolin (ANCEF) IVPB 1 g/50 mL premix     1 g 100 mL/hr over 30 Minutes Intravenous Every 8 hours 10/06/13 0853 10/06/13 2151   10/06/13 0446  bacitracin 50,000 Units in sodium chloride irrigation 0.9 % 500 mL irrigation  Status:  Discontinued       As needed 10/06/13 0506 10/06/13 0736      Assessment/Plan: s/p Procedure(s): CRANIECTOMY HEMATOMA EVACUATION SUBDURAL, PLACEMENT OF SKULL FLAP IN ABDOMEN POSTERIOR LUMBAR FUSION  lumbar one/two Keep on ice chips only. Abdominal X-ray. Maybe transfer to SDU later this  AM. Repeat head CT per NS.   LOS: 3 days   Kathryne Eriksson. Dahlia Bailiff, MD, FACS [phone] Trauma Surgeon 10/08/2013

## 2013-10-09 DIAGNOSIS — D62 Acute posthemorrhagic anemia: Secondary | ICD-10-CM

## 2013-10-09 LAB — CBC WITH DIFFERENTIAL/PLATELET
Basophils Absolute: 0 10*3/uL (ref 0.0–0.1)
Basophils Relative: 0 % (ref 0–1)
EOS ABS: 0.1 10*3/uL (ref 0.0–0.7)
Eosinophils Relative: 0 % (ref 0–5)
HCT: 29.1 % — ABNORMAL LOW (ref 36.0–46.0)
HEMOGLOBIN: 9.8 g/dL — AB (ref 12.0–15.0)
LYMPHS ABS: 1 10*3/uL (ref 0.7–4.0)
LYMPHS PCT: 8 % — AB (ref 12–46)
MCH: 29.8 pg (ref 26.0–34.0)
MCHC: 33.7 g/dL (ref 30.0–36.0)
MCV: 88.4 fL (ref 78.0–100.0)
MONOS PCT: 10 % (ref 3–12)
Monocytes Absolute: 1.3 10*3/uL — ABNORMAL HIGH (ref 0.1–1.0)
NEUTROS PCT: 82 % — AB (ref 43–77)
Neutro Abs: 9.8 10*3/uL — ABNORMAL HIGH (ref 1.7–7.7)
Platelets: 187 10*3/uL (ref 150–400)
RBC: 3.29 MIL/uL — ABNORMAL LOW (ref 3.87–5.11)
RDW: 14.5 % (ref 11.5–15.5)
WBC: 12.1 10*3/uL — AB (ref 4.0–10.5)

## 2013-10-09 LAB — BASIC METABOLIC PANEL
Anion gap: 12 (ref 5–15)
BUN: 12 mg/dL (ref 6–23)
CHLORIDE: 106 meq/L (ref 96–112)
CO2: 24 mEq/L (ref 19–32)
Calcium: 7.6 mg/dL — ABNORMAL LOW (ref 8.4–10.5)
Creatinine, Ser: 0.49 mg/dL — ABNORMAL LOW (ref 0.50–1.10)
GFR calc Af Amer: >90 mL/min (ref >90)
GLUCOSE: 92 mg/dL (ref 70–99)
Potassium: 4.2 mEq/L (ref 3.7–5.3)
SODIUM: 142 meq/L (ref 137–147)

## 2013-10-09 MED ORDER — HYDROCODONE-ACETAMINOPHEN 5-325 MG PO TABS
0.5000 | ORAL_TABLET | ORAL | Status: DC | PRN
  Administered 2013-10-09 – 2013-10-10 (×4): 2 via ORAL
  Administered 2013-10-10: 1 via ORAL
  Administered 2013-10-10 – 2013-10-12 (×9): 2 via ORAL
  Filled 2013-10-09 (×3): qty 2
  Filled 2013-10-09: qty 1
  Filled 2013-10-09 (×10): qty 2

## 2013-10-09 MED ORDER — BETHANECHOL CHLORIDE 25 MG PO TABS
25.0000 mg | ORAL_TABLET | Freq: Four times a day (QID) | ORAL | Status: DC
  Administered 2013-10-09 – 2013-10-11 (×10): 25 mg via ORAL
  Filled 2013-10-09 (×10): qty 1

## 2013-10-09 MED ORDER — METHOCARBAMOL 500 MG PO TABS
500.0000 mg | ORAL_TABLET | Freq: Four times a day (QID) | ORAL | Status: DC | PRN
  Administered 2013-10-09 – 2013-10-12 (×9): 500 mg via ORAL
  Filled 2013-10-09 (×2): qty 1
  Filled 2013-10-09: qty 2
  Filled 2013-10-09 (×6): qty 1

## 2013-10-09 NOTE — Progress Notes (Signed)
Patient arrived on 4N via bed.  Patient alert and oriented and complaining of 8/10 back pain.  Patient arranged in bed and given pain medication.  She was oriented to 4n and our policies. Patient came with a foley from 3S with orders to keep today.  Will continue to monitor.  Kizzie Bane, Rn

## 2013-10-09 NOTE — Progress Notes (Signed)
Attempted to call report

## 2013-10-09 NOTE — Progress Notes (Signed)
Spoke with Dr Hulen Skains to verify order to D/C foley. Catheter was removed yesterday but pt was unable to void on her own, resulting in foley reinsertion. Says it's okay to leave foley in today and he may add medication to assist with urination.
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
## 2013-10-09 NOTE — Progress Notes (Signed)
Patient with discomfort and bloating in the lower abdomen. Bladder scan showed >999 mL. Physician paged for an in and out cath order. Patient was able to void after foley catheter removal around 1530 but has not gone much since then. Will continue to monitor. Erica [name], Erica [name]

## 2013-10-09 NOTE — Progress Notes (Addendum)
Report given to Sonia Baller, RN on 4N. Pt alert and oriented, VSS, c/o back pain 9/10, pain medication given before transfer and all other due meds given up to this time. Personal belongings with family at bedside. Pt transferred on monitor via bed to 4N03. Monitor removed after pt was settled in room with Vicente Males, Therapist, sports.

## 2013-10-09 NOTE — Progress Notes (Signed)
Ileus is resolving.  Started on clear liquids.  Transfer to 4N.  This patient has been seen and I agree with the findings and treatment plan.  Kathryne Eriksson. Dahlia Bailiff, MD, [place] [phone] (pager) [phone] (direct pager) Trauma Surgeon
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
## 2013-10-09 NOTE — Progress Notes (Signed)
UR completed.  Calan Doren, RN BSN MHA CCM Trauma/Neuro ICU Case Manager [phone]  

## 2013-10-09 NOTE — Progress Notes (Signed)
In and out cath revealed 1050 mL. Patient's abdomen is not as distended and patient states she feels much better. Will continue to monitor and recheck within 6 hours. Olney Monier, Rande Brunt
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
## 2013-10-09 NOTE — Progress Notes (Signed)
Patient ID: Erica [name], female   DOB: 1934-01-29, 80 y.o.   MRN: [id number] Vital signs are stable Complains of some back pain. Lower extremity function appears strong Scalp flap is soft pulsatile. Plan rehabilitation in the near future

## 2013-10-10 MED ORDER — BISACODYL 10 MG RE SUPP
10.0000 mg | Freq: Once | RECTAL | Status: AC
  Administered 2013-10-10: 10 mg via RECTAL
  Filled 2013-10-10: qty 1

## 2013-10-10 MED ORDER — DOCUSATE SODIUM 100 MG PO CAPS
100.0000 mg | ORAL_CAPSULE | Freq: Two times a day (BID) | ORAL | Status: DC | PRN
  Administered 2013-10-12 (×2): 100 mg via ORAL
  Filled 2013-10-10 (×2): qty 1
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
## 2013-10-10 NOTE — Progress Notes (Signed)
Subjective: Patient reports doing well.  Has been up to bathroom  Objective: Vital signs in last 24 hours: Temp:  [97.6 F (36.4 C)-98.3 F (36.8 C)] 97.6 F (36.4 C) (08/15 1015) Pulse Rate:  [96-98] 98 (08/15 1015) Resp:  [16-18] 18 (08/15 1015) BP: (161-193)/(76-88) 161/76 mmHg (08/15 1015) SpO2:  [93 %-96 %] 96 % (08/15 1015)  Intake/Output from previous day: 08/14 0701 - 08/15 0700 In: 181.7 [I.V.:181.7] Out: 3400 [Urine:3400] Intake/Output this shift: Total I/O In: -  Out: 575 [Urine:575]  Physical Exam: Scalp pulsatile.  Up to chair in brace.    Lab Results:  Recent Labs  10/09/13 0256  WBC 12.1*  HGB 9.8*  HCT 29.1*  PLT 187   BMET  Recent Labs  10/09/13 0256  NA 142  K 4.2  CL 106  CO2 24  GLUCOSE 92  BUN 12  CREATININE 0.49*  CALCIUM 7.6*    Studies/Results: No results found.  Assessment/Plan: Continue to mobilize.  Doing well.  Await Rehab bed Monday.    LOS: 5 days    Peggyann Shoals, MD 10/10/2013, 11:46 AM

## 2013-10-10 NOTE — Progress Notes (Signed)
Patient able to void 639mL this morning, Was able to stand and pivot to the [facility] with two assist and a brace.

## 2013-10-11 MED ORDER — SENNA 8.6 MG PO TABS
1.0000 | ORAL_TABLET | Freq: Every evening | ORAL | Status: DC | PRN
  Administered 2013-10-11: 8.6 mg via ORAL
  Filled 2013-10-11: qty 1
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
## 2013-10-11 NOTE — Progress Notes (Signed)
Patient ID: Erica [name], female   DOB: 31-Dec-1933, 80 y.o.   MRN: [id number] Vital signs are stable patient is awake and alert. She is progressing with therapy quite well. Patient to be transferred to rehabilitation soon. Staples from scalp can be removed. Okay for transfer to rehabilitation when bed available.

## 2013-10-11 NOTE — Progress Notes (Signed)
Staples removed from R head per order. No bleeding noted and pt tolerated well.

## 2013-10-12 ENCOUNTER — Inpatient Hospital Stay (HOSPITAL_COMMUNITY): Payer: No Typology Code available for payment source

## 2013-10-12 ENCOUNTER — Encounter (HOSPITAL_COMMUNITY): Payer: Self-pay | Admitting: Neurological Surgery

## 2013-10-12 ENCOUNTER — Inpatient Hospital Stay (HOSPITAL_COMMUNITY)
Admission: RE | Admit: 2013-10-12 | Discharge: 2013-10-23 | DRG: 945 | Disposition: A | Discharge status: Other Acute Inpatient Hospital | Payer: No Typology Code available for payment source | Source: Intra-hospital | Attending: Physical Medicine & Rehabilitation | Admitting: Physical Medicine & Rehabilitation

## 2013-10-12 DIAGNOSIS — K567 Ileus, unspecified: Secondary | ICD-10-CM | POA: Diagnosis present

## 2013-10-12 DIAGNOSIS — S32009A Unspecified fracture of unspecified lumbar vertebra, initial encounter for closed fracture: Secondary | ICD-10-CM

## 2013-10-12 DIAGNOSIS — S065XAA Traumatic subdural hemorrhage with loss of consciousness status unknown, initial encounter: Secondary | ICD-10-CM | POA: Diagnosis present

## 2013-10-12 DIAGNOSIS — M62838 Other muscle spasm: Secondary | ICD-10-CM | POA: Diagnosis not present

## 2013-10-12 DIAGNOSIS — F411 Generalized anxiety disorder: Secondary | ICD-10-CM

## 2013-10-12 DIAGNOSIS — G5790 Unspecified mononeuropathy of unspecified lower limb: Secondary | ICD-10-CM | POA: Diagnosis not present

## 2013-10-12 DIAGNOSIS — Z5189 Encounter for other specified aftercare: Principal | ICD-10-CM

## 2013-10-12 DIAGNOSIS — R339 Retention of urine, unspecified: Secondary | ICD-10-CM

## 2013-10-12 DIAGNOSIS — M459 Ankylosing spondylitis of unspecified sites in spine: Secondary | ICD-10-CM | POA: Diagnosis present

## 2013-10-12 DIAGNOSIS — K560 Paralytic ileus: Secondary | ICD-10-CM

## 2013-10-12 DIAGNOSIS — D72829 Elevated white blood cell count, unspecified: Secondary | ICD-10-CM

## 2013-10-12 DIAGNOSIS — S32019A Unspecified fracture of first lumbar vertebra, initial encounter for closed fracture: Secondary | ICD-10-CM | POA: Diagnosis present

## 2013-10-12 DIAGNOSIS — S32029A Unspecified fracture of second lumbar vertebra, initial encounter for closed fracture: Secondary | ICD-10-CM | POA: Diagnosis present

## 2013-10-12 DIAGNOSIS — S065X9A Traumatic subdural hemorrhage with loss of consciousness of unspecified duration, initial encounter: Secondary | ICD-10-CM | POA: Diagnosis present

## 2013-10-12 DIAGNOSIS — I10 Essential (primary) hypertension: Secondary | ICD-10-CM | POA: Diagnosis not present

## 2013-10-12 DIAGNOSIS — K9189 Other postprocedural complications and disorders of digestive system: Secondary | ICD-10-CM | POA: Diagnosis present

## 2013-10-12 DIAGNOSIS — D62 Acute posthemorrhagic anemia: Secondary | ICD-10-CM

## 2013-10-12 LAB — URINALYSIS, ROUTINE W REFLEX MICROSCOPIC
BILIRUBIN URINE: NEGATIVE
Glucose, UA: NEGATIVE mg/dL
Ketones, ur: NEGATIVE mg/dL
Nitrite: POSITIVE — AB
PROTEIN: 100 mg/dL — AB
Specific Gravity, Urine: 1.017 (ref 1.005–1.030)
UROBILINOGEN UA: 1 mg/dL (ref 0.0–1.0)
pH: 6.5 (ref 5.0–8.0)

## 2013-10-12 LAB — URINE MICROSCOPIC-ADD ON

## 2013-10-12 MED ORDER — SULFAMETHOXAZOLE-TMP DS 800-160 MG PO TABS
1.0000 | ORAL_TABLET | Freq: Two times a day (BID) | ORAL | Status: DC
  Administered 2013-10-12: 1 via ORAL
  Filled 2013-10-12: qty 1

## 2013-10-12 MED ORDER — ALUM & MAG HYDROXIDE-SIMETH 200-200-20 MG/5ML PO SUSP: 30.0000 mL | Freq: Four times a day (QID) | ORAL | Status: DC | PRN

## 2013-10-12 MED ORDER — TRAZODONE HCL 50 MG PO TABS
25.0000 mg | ORAL_TABLET | Freq: Every evening | ORAL | Status: DC | PRN
  Administered 2013-10-16 – 2013-10-21 (×6): 50 mg via ORAL
  Filled 2013-10-12 (×7): qty 1

## 2013-10-12 MED ORDER — ACETAMINOPHEN 325 MG PO TABS
325.0000 mg | ORAL_TABLET | ORAL | Status: DC | PRN
  Administered 2013-10-15 – 2013-10-20 (×3): 650 mg via ORAL
  Filled 2013-10-12 (×3): qty 2

## 2013-10-12 MED ORDER — GUAIFENESIN-DM 100-10 MG/5ML PO SYRP: 5.0000 mL | ORAL_SOLUTION | Freq: Four times a day (QID) | ORAL | Status: DC | PRN

## 2013-10-12 MED ORDER — FLEET ENEMA 7-19 GM/118ML RE ENEM: 1.0000 | ENEMA | Freq: Once | RECTAL | Status: AC | PRN

## 2013-10-12 MED ORDER — METOPROLOL TARTRATE 25 MG/10 ML ORAL SUSPENSION
12.5000 mg | Freq: Two times a day (BID) | ORAL | Status: DC
  Administered 2013-10-12 – 2013-10-22 (×21): 12.5 mg via ORAL
  Filled 2013-10-12 (×25): qty 5

## 2013-10-12 MED ORDER — POLYETHYLENE GLYCOL 3350 17 G PO PACK
17.0000 g | PACK | Freq: Every day | ORAL | Status: DC
  Administered 2013-10-13: 17 g via ORAL
  Filled 2013-10-12 (×3): qty 1

## 2013-10-12 MED ORDER — PROCHLORPERAZINE EDISYLATE 5 MG/ML IJ SOLN
5.0000 mg | Freq: Four times a day (QID) | INTRAMUSCULAR | Status: DC | PRN
  Filled 2013-10-12: qty 2

## 2013-10-12 MED ORDER — PHENOL 1.4 % MT LIQD
1.0000 | OROMUCOSAL | Status: DC | PRN
  Filled 2013-10-12: qty 177

## 2013-10-12 MED ORDER — TRAMADOL HCL 50 MG PO TABS
100.0000 mg | ORAL_TABLET | Freq: Four times a day (QID) | ORAL | Status: DC
  Administered 2013-10-13 – 2013-10-15 (×11): 100 mg via ORAL
  Filled 2013-10-12 (×12): qty 2

## 2013-10-12 MED ORDER — BISACODYL 10 MG RE SUPP
10.0000 mg | Freq: Every day | RECTAL | Status: DC | PRN
  Administered 2013-10-15: 10 mg via RECTAL
  Filled 2013-10-12: qty 1

## 2013-10-12 MED ORDER — OXYCODONE HCL 5 MG PO TABS
5.0000 mg | ORAL_TABLET | ORAL | Status: DC | PRN
  Administered 2013-10-12 – 2013-10-16 (×11): 10 mg via ORAL
  Administered 2013-10-16: 5 mg via ORAL
  Administered 2013-10-17 – 2013-10-18 (×3): 10 mg via ORAL
  Administered 2013-10-18: 5 mg via ORAL
  Administered 2013-10-18 – 2013-10-23 (×5): 10 mg via ORAL
  Filled 2013-10-12 (×19): qty 2
  Filled 2013-10-12: qty 1
  Filled 2013-10-12 (×2): qty 2

## 2013-10-12 MED ORDER — METHOCARBAMOL 500 MG PO TABS
500.0000 mg | ORAL_TABLET | Freq: Every day | ORAL | Status: DC
  Administered 2013-10-12: 500 mg via ORAL
  Filled 2013-10-12: qty 1

## 2013-10-12 MED ORDER — BETHANECHOL CHLORIDE 25 MG PO TABS
25.0000 mg | ORAL_TABLET | Freq: Four times a day (QID) | ORAL | Status: DC
  Administered 2013-10-12 – 2013-10-15 (×10): 25 mg via ORAL
  Filled 2013-10-12 (×14): qty 1

## 2013-10-12 MED ORDER — GABAPENTIN 100 MG PO CAPS
100.0000 mg | ORAL_CAPSULE | Freq: Two times a day (BID) | ORAL | Status: DC
  Administered 2013-10-12 – 2013-10-17 (×10): 100 mg via ORAL
  Filled 2013-10-12 (×12): qty 1

## 2013-10-12 MED ORDER — PROCHLORPERAZINE MALEATE 5 MG PO TABS
5.0000 mg | ORAL_TABLET | Freq: Four times a day (QID) | ORAL | Status: DC | PRN
  Filled 2013-10-12: qty 2

## 2013-10-12 MED ORDER — METHOCARBAMOL 500 MG PO TABS
500.0000 mg | ORAL_TABLET | Freq: Four times a day (QID) | ORAL | Status: DC | PRN
  Administered 2013-10-13: 500 mg via ORAL
  Filled 2013-10-12 (×2): qty 1

## 2013-10-12 MED ORDER — MENTHOL 3 MG MT LOZG
1.0000 | LOZENGE | OROMUCOSAL | Status: DC | PRN
  Filled 2013-10-12: qty 9

## 2013-10-12 MED ORDER — PROCHLORPERAZINE 25 MG RE SUPP
12.5000 mg | Freq: Four times a day (QID) | RECTAL | Status: DC | PRN
  Filled 2013-10-12: qty 1

## 2013-10-12 MED ORDER — CIPROFLOXACIN HCL 250 MG PO TABS
250.0000 mg | ORAL_TABLET | Freq: Two times a day (BID) | ORAL | Status: DC
  Administered 2013-10-12 – 2013-10-15 (×6): 250 mg via ORAL
  Filled 2013-10-12 (×8): qty 1
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
## 2013-10-12 NOTE — Progress Notes (Addendum)
Medicare IM (Important Message) delivered to patient today at noon by me in anticipation of discharge.   Sandi Mariscal, RN BSN MHA CCM  Case Manager, Trauma Service/Unit [facility] [phone]

## 2013-10-12 NOTE — Progress Notes (Signed)
Subjective: Patient reports Notes clicking and popping in back.  Objective: Vital signs in last 24 hours: Temp:  [98.1 F (36.7 C)-98.9 F (37.2 C)] 98.4 F (36.9 C) (08/17 1329) Pulse Rate:  [85-95] 87 (08/17 1329) Resp:  [18-20] 20 (08/17 1329) BP: (141-181)/(63-87) 144/68 mmHg (08/17 1329) SpO2:  [96 %-99 %] 99 % (08/17 1329)  Intake/Output from previous day: 08/16 0701 - 08/17 0700 In: -  Out: 4481 [Urine:1650] Intake/Output this shift: Total I/O In: 360 [P.O.:360] Out: -   Incision on back is clean and dry motor function is good in lower extremities  Lab Results: No results found for this basename: WBC, HGB, HCT, PLT,  in the last 72 hours BMET No results found for this basename: NA, K, CL, CO2, GLUCOSE, BUN, CREATININE, CALCIUM,  in the last 72 hours  Studies/Results: Dg Lumbar Spine 2-3 Views  10/12/2013   CLINICAL DATA:  Back pain with popping noises after surgery.  EXAM: LUMBAR SPINE - 2-3 VIEW  COMPARISON:  10/06/2013.  FINDINGS: There is straightening of the normal lumbar lordosis with osseous fusion throughout the vertebral bodies. L1-2 posterior lumbar interbody fusion with distraction and angulation of the L1-2 disc space, as before. Disc space widening at L1-2 appears less severe than on 10/06/2013. No hardware complications. Vertebral body height is otherwise maintained. Atherosclerotic calcification of the arterial vasculature.  IMPRESSION: 1. L1-2 PLIF across a widened and angulated L1-2 disc space. Disc space widening at L1-2 appears less severe than on 10/06/2013. 2. Ankylosing spondylitis.   Electronically Signed   By: Lorin Picket M.D.   On: 10/12/2013 15:59    Assessment/Plan: AP and lateral x-ray obtained today shows good stability across the fracture lumbar fracture.  LOS: 7 days  Okay for transfer to rehabilitation.   Zachry Hopfensperger J 10/12/2013, 4:05 PM
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
## 2013-10-12 NOTE — Progress Notes (Signed)
Report called to 4W for admit to Rehab; patient wants to eat dinner first then be moved to the new room; transferred to 4W12 after 1800 due to patient's dinner time and toileting; transferred via wheelchair with her family; belongings sent with patient.

## 2013-10-12 NOTE — Progress Notes (Signed)
Patient could go to Rehab today.  This patient has been seen and I agree with the findings and treatment plan.  Kathryne Eriksson. Dahlia Bailiff, MD, [place] [phone] (pager) [phone] (direct pager) Trauma Surgeon
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
## 2013-10-13 ENCOUNTER — Inpatient Hospital Stay (HOSPITAL_COMMUNITY): Payer: Medicare HMO

## 2013-10-13 ENCOUNTER — Inpatient Hospital Stay (HOSPITAL_COMMUNITY): Payer: No Typology Code available for payment source | Admitting: Speech Pathology

## 2013-10-13 ENCOUNTER — Inpatient Hospital Stay (HOSPITAL_COMMUNITY): Payer: No Typology Code available for payment source

## 2013-10-13 LAB — COMPREHENSIVE METABOLIC PANEL
ALBUMIN: 2.7 g/dL — AB (ref 3.5–5.2)
ALT: 25 U/L (ref 0–35)
AST: 22 U/L (ref 0–37)
Alkaline Phosphatase: 66 U/L (ref 39–117)
Anion gap: 13 (ref 5–15)
BUN: 13 mg/dL (ref 6–23)
CO2: 27 mEq/L (ref 19–32)
CREATININE: 0.5 mg/dL (ref 0.50–1.10)
Calcium: 8.6 mg/dL (ref 8.4–10.5)
Chloride: 98 mEq/L (ref 96–112)
GFR calc non Af Amer: >90 mL/min (ref >90)
GLUCOSE: 120 mg/dL — AB (ref 70–99)
Potassium: 3.5 mEq/L — ABNORMAL LOW (ref 3.7–5.3)
Sodium: 138 mEq/L (ref 137–147)
Total Bilirubin: 0.5 mg/dL (ref 0.3–1.2)
Total Protein: 6 g/dL (ref 6.0–8.3)

## 2013-10-13 LAB — CBC WITH DIFFERENTIAL/PLATELET
BASOS PCT: 0 % (ref 0–1)
Basophils Absolute: 0 10*3/uL (ref 0.0–0.1)
EOS PCT: 1 % (ref 0–5)
Eosinophils Absolute: 0.1 10*3/uL (ref 0.0–0.7)
HCT: 31.6 % — ABNORMAL LOW (ref 36.0–46.0)
HEMOGLOBIN: 10.8 g/dL — AB (ref 12.0–15.0)
Lymphocytes Relative: 6 % — ABNORMAL LOW (ref 12–46)
Lymphs Abs: 0.9 10*3/uL (ref 0.7–4.0)
MCH: 29.1 pg (ref 26.0–34.0)
MCHC: 34.2 g/dL (ref 30.0–36.0)
MCV: 85.2 fL (ref 78.0–100.0)
Monocytes Absolute: 1.3 10*3/uL — ABNORMAL HIGH (ref 0.1–1.0)
Monocytes Relative: 9 % (ref 3–12)
Neutro Abs: 12.1 10*3/uL — ABNORMAL HIGH (ref 1.7–7.7)
Neutrophils Relative %: 84 % — ABNORMAL HIGH (ref 43–77)
Platelets: 337 10*3/uL (ref 150–400)
RBC: 3.71 MIL/uL — ABNORMAL LOW (ref 3.87–5.11)
RDW: 14.7 % (ref 11.5–15.5)
WBC: 14.4 10*3/uL — ABNORMAL HIGH (ref 4.0–10.5)

## 2013-10-13 MED ORDER — DICLOFENAC SODIUM 1 % TD GEL
4.0000 g | Freq: Three times a day (TID) | TRANSDERMAL | Status: DC
  Administered 2013-10-13 – 2013-10-19 (×16): 4 g via TOPICAL
  Filled 2013-10-13: qty 100

## 2013-10-13 MED ORDER — METHOCARBAMOL 500 MG PO TABS
500.0000 mg | ORAL_TABLET | Freq: Four times a day (QID) | ORAL | Status: DC
  Administered 2013-10-13 – 2013-10-23 (×39): 500 mg via ORAL
  Filled 2013-10-13 (×66): qty 1

## 2013-10-13 MED ORDER — SENNOSIDES-DOCUSATE SODIUM 8.6-50 MG PO TABS
2.0000 | ORAL_TABLET | Freq: Every day | ORAL | Status: DC
  Administered 2013-10-14: 2 via ORAL
  Filled 2013-10-13: qty 2

## 2013-10-13 MED ORDER — ALPRAZOLAM 0.25 MG PO TABS
0.2500 mg | ORAL_TABLET | Freq: Two times a day (BID) | ORAL | Status: DC
  Administered 2013-10-13 – 2013-10-23 (×19): 0.25 mg via ORAL
  Filled 2013-10-13 (×18): qty 1

## 2013-10-13 MED ORDER — FLEET ENEMA 7-19 GM/118ML RE ENEM: 1.0000 | ENEMA | Freq: Once | RECTAL | Status: AC | PRN

## 2013-10-13 NOTE — Progress Notes (Signed)
Physical Therapy Session Note  Patient Details  Name: Erica [name] MRN: [id number] Date of Birth: December 10, 1933  Today's Date: 10/13/2013 PT Individual Time: 1630-1700 PT Individual Time Calculation (min): 30 min   Short Term Goals: Week 1:  PT Short Term Goal 1 (Week 1): Pt to perform bed mobility with mod A and without use of hosptial bed functions PT Short Term Goal 2 (Week 1): Pt to perform bed<>w/c transfers with min guard A PT Short Term Goal 3 (Week 1): Pt to propel w/c 100' with supervision PT Short Term Goal 4 (Week 1): Pt to ambulate 20' with RW and min guard A PT Short Term Goal 5 (Week 1): Pt to negotiate up/down 3 steps with single rail  Skilled Therapeutic Interventions/Progress Updates:  1:1. Pt received sitting on BSC, PT taking over for nurse tech. Focus this session on activity tolerance. Pt very anxious and perseverating on pain w/ all active mobility, req prolonged encouragement prior to performance of all mobility this session. Pt req mod A for t/f sit<>stand from commode, able to maintain standing w/ single UE support to assist therapist while fastening brief, min A for SPT toilet>w/c w/ RW. Pt w/ fair tolerance to ambulation x10' in room w/c>high back chair with RW and min A. Pt extremely fearful of increased pain during first two steps, but able to progress speed and fluidity of reciprocal pattern w/out report of significant increase in LBP. Pt left sitting in high back chair w/ all needs in reach, quick release belt in place and RN in room to address pain.   Therapy Documentation Precautions:  Precautions Precautions: Fall;Back Precaution Comments: Bone flap in R abdomen Required Braces or Orthoses: Spinal Brace Spinal Brace: Lumbar corset;Applied in sitting position Restrictions Weight Bearing Restrictions: No  See FIM for current functional status  Therapy/Group: Individual Therapy  [place], [place] 10/13/2013, 5:46 PM
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
## 2013-10-13 NOTE — Progress Notes (Signed)
Patient information reviewed and entered into [facility] by Becky Aurilla Coulibaly, covering PPS coordinator. Information including medical coding and functional independence measure will be reviewed and updated through discharge.  Per nursing, patient was given "Data Collection Information Summary for Patients in Inpatient Rehabilitation Facilities with attached Privacy Act Statement Health Care Records" upon admission.     
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
## 2013-10-14 ENCOUNTER — Inpatient Hospital Stay (HOSPITAL_COMMUNITY): Payer: No Typology Code available for payment source | Admitting: Speech Pathology

## 2013-10-14 ENCOUNTER — Inpatient Hospital Stay (HOSPITAL_COMMUNITY): Payer: No Typology Code available for payment source

## 2013-10-14 ENCOUNTER — Encounter (HOSPITAL_COMMUNITY): Payer: Medicare HMO

## 2013-10-14 ENCOUNTER — Inpatient Hospital Stay (HOSPITAL_COMMUNITY): Payer: Medicare HMO | Admitting: *Deleted

## 2013-10-14 ENCOUNTER — Inpatient Hospital Stay (HOSPITAL_COMMUNITY): Payer: Medicare HMO

## 2013-10-14 DIAGNOSIS — D62 Acute posthemorrhagic anemia: Secondary | ICD-10-CM

## 2013-10-14 DIAGNOSIS — S065X9A Traumatic subdural hemorrhage with loss of consciousness of unspecified duration, initial encounter: Secondary | ICD-10-CM

## 2013-10-14 DIAGNOSIS — S32009A Unspecified fracture of unspecified lumbar vertebra, initial encounter for closed fracture: Secondary | ICD-10-CM

## 2013-10-14 DIAGNOSIS — K560 Paralytic ileus: Secondary | ICD-10-CM

## 2013-10-14 DIAGNOSIS — M459 Ankylosing spondylitis of unspecified sites in spine: Secondary | ICD-10-CM

## 2013-10-14 DIAGNOSIS — S065XAA Traumatic subdural hemorrhage with loss of consciousness status unknown, initial encounter: Secondary | ICD-10-CM

## 2013-10-14 DIAGNOSIS — Z5189 Encounter for other specified aftercare: Secondary | ICD-10-CM

## 2013-10-14 MED ORDER — BISACODYL 10 MG RE SUPP
10.0000 mg | Freq: Once | RECTAL | Status: DC
  Filled 2013-10-14: qty 1

## 2013-10-14 NOTE — Progress Notes (Signed)
Speech Language Pathology Daily Session Note  Patient Details  Name: Erica [name] MRN: [id number] Date of Birth: 09/26/1933  Today's Date: 10/14/2013 SLP Individual Time: 0901-1001 SLP Individual Time Calculation (min): 60 min  Short Term Goals: Week 1: SLP Short Term Goal 1 (Week 1): Patient will demonstrate sustained attention to a functional and familiar task with supervision cues for 60 minutes.  SLP Short Term Goal 2 (Week 1): Patient will demonstrate functional problem solving for mildly complex tasks with supervision cues.  SLP Short Term Goal 3 (Week 1): Patient will recall new, daily information with use of external aids with supervision cues.  SLP Short Term Goal 4 (Week 1): Patient will self-monitor and correct verbosity during a functional conversation with supervision cues.   Skilled Therapeutic Interventions: Skilled treatment session focused on addressing diagnostic treatment of reading comprehension and written expression as well as recall of new information.  Patient overall Mod I for functional reading and writing tasks.  SLP facilitated session wtih follow up discussion regarding current medications (previously discussed with RN) and patient required Mod cues to recall medications and functions following a 30 minute delay.  SLP implemented an external aid to assist with recall of this new complex information which patient required Supervision cues to utilize accurately.     FIM:  Comprehension Comprehension Mode: Auditory Comprehension: 5-Understands complex 90% of the time/Cues < 10% of the time Expression Expression Mode: Verbal Expression: 5-Expresses complex 90% of the time/cues < 10% of the time Social Interaction Social Interaction: 4-Interacts appropriately 75 - 89% of the time - Needs redirection for appropriate language or to initiate interaction. Problem Solving Problem Solving: 4-Solves basic 75 - 89% of the time/requires cueing 10 - 24% of the  time Memory Memory: 3-Recognizes or recalls 50 - 74% of the time/requires cueing 25 - 49% of the time  Pain Pain Assessment Pain Assessment: 0-10 Pain Score: 2  Faces Pain Scale: Hurts a little bit Pain Type: Surgical pain Pain Location: Back Pain Orientation: Mid;Lower Pain Frequency: Intermittent Pain Onset: On-going Pain Intervention(s): Medication (See eMAR), RN aware and patient premedicated   Therapy/Group: Individual Therapy  Carmelia Roller., CCC-SLP [phone]  [place] 10/14/2013, 12:09 PM
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
## 2013-10-14 NOTE — Progress Notes (Signed)
Physical Therapy Session Note  Patient Details  Name: Erica [name] MRN: [id number] Date of Birth: May 07, 1933  Today's Date: 10/14/2013 PT Individual Time: Treatment Session 1: 1130-1200; Treatment Session 2: 1300-1400 PT Individual Time Calculation (min): Treatment Session 1: 30 min ; Treatment Session 2: 16min  Short Term Goals: Week 1:  PT Short Term Goal 1 (Week 1): Pt to perform bed mobility with mod A and without use of hosptial bed functions PT Short Term Goal 2 (Week 1): Pt to perform bed<>w/c transfers with min guard A PT Short Term Goal 3 (Week 1): Pt to propel w/c 100' with supervision PT Short Term Goal 4 (Week 1): Pt to ambulate 20' with RW and min guard A PT Short Term Goal 5 (Week 1): Pt to negotiate up/down 3 steps with single rail  Skilled Therapeutic Interventions/Progress Updates:  Treatment Session 1:  1:1. Pt received sitting in w/c, verbalized having "very tough and trying morning, my legs feel so weak." Focus this session on activity tolerance. Pt req consistent max encouragement to participate throughout session as well as to sustain attention to therapeutic tasks as pt frequently attempts to divert conversation away from task to prevent performance. Pt req min A for w/c propulsion 125' w/ B UE and consistent mod A for t/f sit<>stand x2. Pt attempted ambulation up to 2', however, limited by incontinence. Pt able to stand 1x in room w/ mod A for management of clothing, but unable to stand 2x due to reported pain, however, pt did not attempt to initiate stand. Pt performing pericare at seated level. Pt left sitting in w/c at end of session w/ all needs in reach, quick release belt in place and granddaughter in room.   Treatment Session 2:  1:1. Pt received sitting in w/c, anxious to participate in physical therapy. Focus this session on activity tolerance and functional transfers. Pt req consistent max encouragement to demonstrate active participation in session as well as  max cues to sustain attention to therapeutic tasks as pt frequently attempting to divert conversation in attempt to avoid mobility. Pt perseverating throughout session on pain, self-doubt and friend's stay at [facility] (for completely different dx) who was present throughout tx session. Extensive education regarding increased pain during mobility if pt lets people move her vs. pt actively engaging in mobility. Pt req 15min to complete toilet transfer w/ min-mod A w/ use of RW and max A for toileting due to need for brief change 2x due to incontinence. Pt req 20' to perform SPT w/c<>bed w/ RW and min-max A as well as sup<>sit EOB in a standard bed in attempt to practice log roll technique for increased tolerance and pain management, but pt req total A due to lack of initiation. Pt left sitting in w/c at end of session w/ all needs in reach, quick release belt in place and friend in room. RN aware of pt's increased pain during tx session.   Therapy Documentation Precautions:  Precautions Precautions: Fall;Back Precaution Booklet Issued: Yes (comment) Precaution Comments: Bone flap in R abdomen Required Braces or Orthoses: Spinal Brace Spinal Brace: Lumbar corset;Applied in sitting position Restrictions Weight Bearing Restrictions: No  Pain: Pain Assessment Pain Assessment: 0-10 Pain Score: 9  Faces Pain Scale: Hurts a little bit Pain Type: Surgical pain Pain Location: Back Pain Orientation: Mid;Lower Pain Descriptors / Indicators: Aching;Grimacing;Spasm;Sharp Pain Frequency: Intermittent Pain Onset: On-going Pain Intervention(s): RN made aware;Repositioned;Distraction  See FIM for current functional status  Therapy/Group: Individual Therapy  Jalaya, Sarver  10/14/2013, 12:10 PM
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
## 2013-10-14 NOTE — Progress Notes (Signed)
Occupational Therapy Session Note  Patient Details  Name: Erica [name] MRN: [id number] Date of Birth: 06-04-1933  Today's Date: 10/14/2013 OT Individual Time: 3568-6168 and 1405-1500 OT Individual Time Calculation (min): 74 min and 55 min    Short Term Goals: Week 1:  OT Short Term Goal 1 (Week 1): Pt will consistently complete stand pivot transfer with min assist and min cues OT Short Term Goal 2 (Week 1): Pt will complete LB dressing with mod assist using AE, if required OT Short Term Goal 3 (Week 1): Pt will complete 1 grooming task in standing for 30 seconds with min assist  OT Short Term Goal 4 (Week 1): Pt will adhere to precautions during lower body dressing with min cues  Skilled Therapeutic Interventions/Progress Updates:    Session 1: Pt seen for ADL retraining with focus on adherence to precautions, functional transfers, pain management, and activity tolerance. Pt received supine in bed requesting to toilet. Donned corset brace EOB and transferred bed>BSC with min assist using RW. Following toilet task, transferred BSC>w/c with mod assist and multiple attempts d/t fear of pain with sit>stand. Completed bathing at shower level with more then reasonable amount of time. Verbal orders from PA to allow incisions to get wet and remove brace once sitting in shower then reapply before transferring out of shower. Pt required max assist for sit>stand in shower and min assist for standing balance. Pt required multiple attempts for sit>stand d/t pain and anxiety. Completed dressing from w/c with attempt to use crossover technique however unsuccessful. Pt verbalized all precautions at beginning of session and demonstrated carryover without cues. Pt left sitting in w/c with QRB donned and family present. Pt very fatigued throughout session, requiring multiple rest breaks.   Session 2: Pt seen for 1:1 OT session with focus on functional transfers, safety awareness, and activity tolerance. Pt received  sitting in w/c with friend present. Discussed pt's personal goals in order to return home, with pt verbalizing needing to "move" better. Practiced stand pivot transfer w/c>bed with pt requiring min assist and increased amount of time. Pt required max cues for sustained attention and encouragement to complete transfer once up in standing as pt distracted from pain stating "I'm so weak" and "this is too much." Upon completion of transfer able to redirect to goals of therapy and requested to transfer to [facility] for toilet task. RN entered to inform pt and therapist of x-ray this afternoon. Pt immediately increasing anxiety and required max cues with more than reasonable amount of time to redirect to therapeutic activities. Began stand pivot transfer bed>BSC with RW with min assist for sit>stand. Pt took 1 step then began perseverating on not wanting to go to xray, sitting unsafely. Pt required max assist for sitting EOB d/t decreased safety awareness and impaired sustained attention as pt continued crying over xray. Pt required total assist for repositioning. Attempted to have therapeutic conversation in regards to safety however pt perseverating on x-ray. Pt declined toileting at this point. Required increased time and min-mod assist for side stepping to [facility]. Pt required +2 assist for sit>supine to adhere to precautions as pt with high anxiety and no awareness. Assisted pt with positioning via pillows, however pt attempting to roll in bed in multiple directs. Educated on positioning in bed with precautions and able to position pt to adhere to them. Nurse tech present.  Therapy Documentation Precautions:  Precautions Precautions: Fall;Back Precaution Booklet Issued: Yes (comment) Precaution Comments: Bone flap in R abdomen Required Braces or  Orthoses: Spinal Brace Spinal Brace: Lumbar corset;Applied in sitting position Restrictions Weight Bearing Restrictions: No General:   Vital Signs: Therapy Vitals Temp:  99 F (37.2 C) Temp src: Oral Pulse Rate: 82 Resp: 18 BP: 149/68 mmHg Patient Position (if appropriate): Lying Oxygen Therapy SpO2: 98 % O2 Device: None (Room air) Pain: Pt reports pain in back throughout session with movement and in anticipation of movement.  Other Treatments:    See FIM for current functional status  Therapy/Group: Individual Therapy  Duayne Cal 10/14/2013, 8:45 AM

## 2013-10-15 ENCOUNTER — Inpatient Hospital Stay (HOSPITAL_COMMUNITY): Payer: Medicare HMO | Admitting: *Deleted

## 2013-10-15 ENCOUNTER — Inpatient Hospital Stay (HOSPITAL_COMMUNITY): Payer: Medicare HMO | Admitting: Occupational Therapy

## 2013-10-15 ENCOUNTER — Inpatient Hospital Stay (HOSPITAL_COMMUNITY): Payer: Medicare HMO

## 2013-10-15 ENCOUNTER — Inpatient Hospital Stay (HOSPITAL_COMMUNITY): Payer: No Typology Code available for payment source | Admitting: *Deleted

## 2013-10-15 ENCOUNTER — Encounter (HOSPITAL_COMMUNITY): Payer: Medicare HMO

## 2013-10-15 ENCOUNTER — Inpatient Hospital Stay (HOSPITAL_COMMUNITY): Payer: No Typology Code available for payment source | Admitting: Speech Pathology

## 2013-10-15 DIAGNOSIS — I6200 Nontraumatic subdural hemorrhage, unspecified: Secondary | ICD-10-CM

## 2013-10-15 LAB — URINE CULTURE: Colony Count: >=100000

## 2013-10-15 MED ORDER — BETHANECHOL CHLORIDE 25 MG PO TABS
25.0000 mg | ORAL_TABLET | Freq: Three times a day (TID) | ORAL | Status: DC
  Administered 2013-10-15 – 2013-10-19 (×12): 25 mg via ORAL
  Filled 2013-10-15 (×15): qty 1

## 2013-10-15 MED ORDER — POLYETHYLENE GLYCOL 3350 17 G PO PACK
17.0000 g | PACK | Freq: Once | ORAL | Status: DC
  Filled 2013-10-15: qty 1

## 2013-10-15 MED ORDER — POLYETHYLENE GLYCOL 3350 17 G PO PACK: 17.0000 g | PACK | Freq: Once | ORAL | Status: DC

## 2013-10-15 MED ORDER — SENNOSIDES-DOCUSATE SODIUM 8.6-50 MG PO TABS
3.0000 | ORAL_TABLET | Freq: Every day | ORAL | Status: DC
  Administered 2013-10-16: 3 via ORAL
  Filled 2013-10-15: qty 3

## 2013-10-15 MED ORDER — CEFIXIME 400 MG PO TABS
400.0000 mg | ORAL_TABLET | Freq: Every day | ORAL | Status: AC
  Administered 2013-10-15 – 2013-10-19 (×5): 400 mg via ORAL
  Filled 2013-10-15 (×6): qty 1

## 2013-10-15 MED ORDER — TRAMADOL HCL 50 MG PO TABS
50.0000 mg | ORAL_TABLET | Freq: Four times a day (QID) | ORAL | Status: DC
  Administered 2013-10-15 – 2013-10-20 (×19): 50 mg via ORAL
  Filled 2013-10-15 (×19): qty 1

## 2013-10-15 NOTE — Progress Notes (Signed)
Occupational Therapy Note  Patient Details  Name: [name] MRN: [id number] Date of Birth: Jul 29, 1933  Today's Date: 10/15/2013 OT missed time: 30 minutes  Upon entering the room, RN present in room. RN reporting she just gave pt and enema. Pt seated on bed pan supine in bed and reporting, "I hurt so bad I just can't do anything else honey." Pt reporting 10/10 pain in lower back. Lumbar brace on pt.      [name] 10/15/2013, 11:41 AM
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
## 2013-10-15 NOTE — Progress Notes (Signed)
Physical Therapy Session Note  Patient Details  Name: Erica [name] MRN: [id number] Date of Birth: 07-Oct-1933  Today's Date: 10/15/2013 PT Individual Time: 1300-1400 PT Individual Time Calculation (min): 60 min   Short Term Goals: Week 1:  PT Short Term Goal 1 (Week 1): Pt to perform bed mobility with mod A and without use of hosptial bed functions PT Short Term Goal 2 (Week 1): Pt to perform bed<>w/c transfers with min guard A PT Short Term Goal 3 (Week 1): Pt to propel w/c 100' with supervision PT Short Term Goal 4 (Week 1): Pt to ambulate 20' with RW and min guard A PT Short Term Goal 5 (Week 1): Pt to negotiate up/down 3 steps with single rail  Skilled Therapeutic Interventions/Progress Updates:    Patient received sitting in recliner. Session focused on initiation, active participation, sit<>stand and functional transfers, and functional ambulation. Patient transported to therapy gym seated in recliner. Repeated sit<>stand in // bars x5 with 30"-60" hold in standing with supervision. Patient with reports of needing to use bathroom. Recliner strategically positioned outside of bathroom to motivate patient to ambulate to toilet.   Patient ambulates with x13' with RW and minA, one instance of modA secondary to L knee buckling. Patient requires total A for doffing of saturated brief, but able to perform hygiene without assistance. Sit>stand from toilet with grab bars and RW and minA, functional ambulation x13' with RW, minA with x3 instances of totalA secondary to B knee buckling, patient sitting on therapist's thigh to prevent fall. Patient eventually able to ambulate to recliner and sit. Throughout ambulation from toilet>recliner, patient calling out for help/yelling/praying and requires max cues to redirect attention to complete ambulation and sit in recliner safely. Patient left sitting in recliner with all needs within reach and visitor present.  Patient requires max cues for encouragement  and redirection to task throughout entire session. Additionally, patient appearing confused, yelling out for daughter throughout.  Therapy Documentation Precautions:  Precautions Precautions: Fall;Back Precaution Booklet Issued: Yes (comment) Precaution Comments: Bone flap in R abdomen Required Braces or Orthoses: Spinal Brace Spinal Brace: Lumbar corset;Applied in sitting position Restrictions Weight Bearing Restrictions: No Pain: Pain Assessment Pain Assessment: 0-10 Pain Score: 4  Pain Type: Surgical pain Pain Location: Back Pain Orientation: Mid;Lower Pain Descriptors / Indicators: Aching;Shooting Pain Onset: On-going Pain Intervention(s): RN made aware;Repositioned;Ambulation/increased activity Multiple Pain Sites: No Locomotion : Ambulation Ambulation/Gait Assistance: 1: +1 Total assist;4: Min assist   See FIM for current functional status  Therapy/Group: Individual Therapy  Lillia Abed. Bethanie Bloxom, PT, DPT 10/15/2013, 3:16 PM
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
## 2013-10-15 NOTE — Progress Notes (Signed)
Speech Language Pathology Daily Session Note  Patient Details  Name: Erica [name] MRN: [id number] Date of Birth: 05-23-1933  Today's Date: 10/15/2013 SLP Individual Time: 0905-1005 SLP Individual Time Calculation (min): 60 min  Short Term Goals: Week 1: SLP Short Term Goal 1 (Week 1): Patient will demonstrate sustained attention to a functional and familiar task with supervision cues for 60 minutes.  SLP Short Term Goal 2 (Week 1): Patient will demonstrate functional problem solving for mildly complex tasks with supervision cues.  SLP Short Term Goal 3 (Week 1): Patient will recall new, daily information with use of external aids with supervision cues.  SLP Short Term Goal 4 (Week 1): Patient will self-monitor and correct verbosity during a functional conversation with supervision cues.   Skilled Therapeutic Interventions: Skilled treatment session focused on cognitive goals. SLP facilitated the session by providing Min A verbal and visual cues for appropriate organization of information from a field of 3 during a mildly complex money management task. Pt was Mod I for problem solving throughout the task.  SLP also facilitated the session by providing Mod A verbal, question and visual cues for appropriate problem solving and organization with a new learning task of organizing a pill box with her current medications.  The patient utilized her external memory aid but required Mod verbal cues for organization and mental flexibility throughout the task.  Suspect patient's overall cognitive function was impacted by the patient's fatigue and "not feeling well" throughout the session. Patient left in room with quick release belt in place and all needs within reach.  Continue with current plan of care.    FIM:  Comprehension Comprehension Mode: Auditory Comprehension: 5-Understands basic 90% of the time/requires cueing < 10% of the time Expression Expression Mode: Verbal Expression: 5-Expresses basic  90% of the time/requires cueing < 10% of the time. Social Interaction Social Interaction: 3-Interacts appropriately 50 - 74% of the time - May be physically or verbally inappropriate. Problem Solving Problem Solving: 4-Solves basic 75 - 89% of the time/requires cueing 10 - 24% of the time Memory Memory: 3-Recognizes or recalls 50 - 74% of the time/requires cueing 25 - 49% of the time  Pain Does not report pain but reports, "not feeling well." RN made aware.   Therapy/Group: Individual Therapy  Micheala Morissette 10/15/2013, 10:26 AM

## 2013-10-15 NOTE — Progress Notes (Signed)
Occupational Therapy Session Note  Patient Details  Name: Erica [name] MRN: [id number] Date of Birth: 1933/08/13  Today's Date: 10/15/2013 OT Individual Time: 0727-0827 OT Individual Time Calculation (min): 60 min   Short Term Goals: Week 1:  OT Short Term Goal 1 (Week 1): Pt will consistently complete stand pivot transfer with min assist and min cues OT Short Term Goal 2 (Week 1): Pt will complete LB dressing with mod assist using AE, if required OT Short Term Goal 3 (Week 1): Pt will complete 1 grooming task in standing for 30 seconds with min assist  OT Short Term Goal 4 (Week 1): Pt will adhere to precautions during lower body dressing with min cues  Skilled Therapeutic Interventions/Progress Updates:    Pt seen for ADL retraining with focus on activity tolerance, functional transfers, and standing balance. Pt received supine in bed requesting to transfer to [facility]. As therapist setting up transfer, pt with high anxiety and very fearful of movement d/t pain. Required max cues for sustained attention and encouragement to complete supine>sit and SPT bed>BSC as pt distracted by pain and stating "I'm too weak." Pt required max assist supine>sit and min assist sit>stand with increased time to initiate movement. Engaged in therapeutic conversation while pt toileting with emphasis on pt's goals to return home, relaxation techniques, remaining optimistic. Pt required max cues for sustained attention throughout therapeutic conversation as she continued to perseverate on pain and fear of movement. Pt completed sit<>stand from [facility] 4x with min assist and increased time. Completed dressing using crossover technique for donning R sock. Pt left sitting on BSC with NT present.   Therapy Documentation Precautions:  Precautions Precautions: Fall;Back Precaution Booklet Issued: Yes (comment) Precaution Comments: Bone flap in R abdomen Required Braces or Orthoses: Spinal Brace Spinal Brace: Lumbar  corset;Applied in sitting position Restrictions Weight Bearing Restrictions: No General:   Vital Signs: Therapy Vitals Temp: 98.3 F (36.8 C) Temp src: Oral Pulse Rate: 89 Resp: 18 BP: 156/68 mmHg Patient Position (if appropriate): Lying Oxygen Therapy SpO2: 98 % O2 Device: None (Room air) Pain: Pt reports pain with movement and anticipation of movement.   See FIM for current functional status  Therapy/Group: Individual Therapy  Duayne Cal 10/15/2013, 8:46 AM

## 2013-10-15 NOTE — Progress Notes (Signed)
Recreational Therapy Session Note  Patient Details  Name: Erica [name] MRN: [id number] Date of Birth: 08/11/1933 Today's Date: 10/15/2013   Order received & chart reviewed.  Attempted eval completion, pt falling asleep throughout session needing max verbal cues & tactile cues to attend.  RN/NT made aware & to assess.  Per discussion with team, pt with limited participation in therapies needing max cues to participate.  Pt placed on on HOLD for TR services at this time.  Will continue to monitor through team.  Adon Gehlhausen 10/15/2013, 3:23 PM

## 2013-10-15 NOTE — Progress Notes (Signed)
Occupational Therapy Note  Patient Details  Name: Erica [name] MRN: [id number] Date of Birth: September 16, 1933  Today's Date: 10/15/2013 OT Individual Time: 1400-1500 OT Individual Time Calculation (min): 60 min  Pt resting in recliner upon arrival with visitor present. Pt tearful throughout session.  Visitor left room after about 15 mins to allow patient to focus on therapy.  Pt engaged in sit<>stand from recliner requiring min A.  Pt became anxious during session and continued to refer to earlier session with PT when pt's knees buckled.  Encouragement and emotional support provided throughout session.  Pt c/o pain across back and abdomen (7/10); RN aware and heat applied to back.  Pt required max encouragement to participate.  Focus on activity tolerance, participation, sit<>stand, and standing balance.    Leotis Shames [facility] 10/15/2013, 3:09 PM

## 2013-10-16 ENCOUNTER — Inpatient Hospital Stay (HOSPITAL_COMMUNITY): Payer: Medicare HMO

## 2013-10-16 ENCOUNTER — Encounter (HOSPITAL_COMMUNITY): Payer: Medicare HMO

## 2013-10-16 ENCOUNTER — Inpatient Hospital Stay (HOSPITAL_COMMUNITY): Payer: No Typology Code available for payment source | Admitting: Speech Pathology

## 2013-10-16 MED ORDER — SENNOSIDES-DOCUSATE SODIUM 8.6-50 MG PO TABS
3.0000 | ORAL_TABLET | Freq: Once | ORAL | Status: AC
  Administered 2013-10-16: 3 via ORAL
  Filled 2013-10-16: qty 3

## 2013-10-16 MED ORDER — OXYCODONE HCL 5 MG PO TABS
10.0000 mg | ORAL_TABLET | Freq: Once | ORAL | Status: AC
  Administered 2013-10-16: 10 mg via ORAL
  Filled 2013-10-16: qty 2

## 2013-10-16 MED ORDER — CALCIUM CARBONATE-VITAMIN D 500-200 MG-UNIT PO TABS
1.0000 | ORAL_TABLET | Freq: Two times a day (BID) | ORAL | Status: DC
  Administered 2013-10-16 – 2013-10-22 (×14): 1 via ORAL
  Filled 2013-10-16 (×17): qty 1

## 2013-10-16 MED ORDER — SENNOSIDES-DOCUSATE SODIUM 8.6-50 MG PO TABS: 1.0000 | ORAL_TABLET | Freq: Once | ORAL | Status: DC

## 2013-10-16 MED ORDER — MAGNESIUM OXIDE 400 (241.3 MG) MG PO TABS
200.0000 mg | ORAL_TABLET | Freq: Every day | ORAL | Status: DC
  Administered 2013-10-16 – 2013-10-22 (×8): 200 mg via ORAL
  Filled 2013-10-16 (×9): qty 0.5

## 2013-10-16 MED ORDER — SORBITOL 70 % SOLN
60.0000 mL | Freq: Once | Status: AC
  Administered 2013-10-16: 60 mL via ORAL
  Filled 2013-10-16: qty 60

## 2013-10-16 MED ORDER — OXYCODONE HCL 5 MG PO TABS
10.0000 mg | ORAL_TABLET | Freq: Two times a day (BID) | ORAL | Status: DC
  Administered 2013-10-16 – 2013-10-22 (×11): 10 mg via ORAL
  Filled 2013-10-16 (×12): qty 2

## 2013-10-16 MED ORDER — SENNOSIDES-DOCUSATE SODIUM 8.6-50 MG PO TABS
2.0000 | ORAL_TABLET | Freq: Three times a day (TID) | ORAL | Status: DC
  Administered 2013-10-17 – 2013-10-22 (×16): 2 via ORAL
  Filled 2013-10-16 (×4): qty 2
  Filled 2013-10-16: qty 1
  Filled 2013-10-16 (×13): qty 2

## 2013-10-16 MED ORDER — OMEGA-3-ACID ETHYL ESTERS 1 G PO CAPS
1.0000 g | ORAL_CAPSULE | Freq: Two times a day (BID) | ORAL | Status: DC
  Administered 2013-10-16 – 2013-10-22 (×14): 1 g via ORAL
  Filled 2013-10-16 (×16): qty 1

## 2013-10-16 MED ORDER — MAGNESIUM GLUCONATE 500 MG PO TABS: 250.0000 mg | ORAL_TABLET | Freq: Every day | ORAL | Status: DC

## 2013-10-16 NOTE — Progress Notes (Signed)
Discussed with patient and patient's daughter that a soap suds enema is scheduled for this evening. Patient stated that she didn't want to do enema today, stating her she didn't feel that she can tolerate being disimpacted today. Explained the importance of emptying bowels, and the need for enema. Daughter requested to have enema tomorrow. Notified Algis Liming, PA of patient's daughter request. Will continue to monitor patient. [place]

## 2013-10-16 NOTE — Progress Notes (Signed)
Social Work Patient ID: Iona Beard, female   DOB: November 14, 1933, 80 y.o.   MRN: [id number]  Late note:  Have reviewed team conference with pt and daughter, Elsie Amis, with both aware of targeted d/c date of 8/29.  Both very concerned that pt will "not be ready" by this date.  Daughter states, "If she is no better than this, then this is not going home level to me."  Pt moaning throughout discussion and c/o ongoing bowel issues which she and family feel are primary barrier to pt's ability to make any progress.  Have informed RN that they have questions about our plans for bowels.   Have also alerted Algis Liming, PA as well and that daughter has brought in home meds of pt for her to see.  Daughter did followed be out of the room and reported that, unless significant gains are made with pt's function, she feels they would probably need to consider SNF.  Will continue to follow to assess d/c planning needs and to provide support.  Pt was consulted on by Dr. Beverly Gust yesterday and I have placed her on schedule for Dr. Vikki Ports to follow up with on Monday.    Rosamaria Donn, LCSW

## 2013-10-16 NOTE — Progress Notes (Signed)
Speech Language Pathology Daily Session Note  Patient Details  Name: Erica [name] MRN: [id number] Date of Birth: Aug 18, 1933  Today's Date: 10/16/2013 SLP Individual Time: 1400-1500 SLP Individual Time Calculation (min): 60 min  Short Term Goals: Week 1: SLP Short Term Goal 1 (Week 1): Patient will demonstrate sustained attention to a functional and familiar task with supervision cues for 60 minutes.  SLP Short Term Goal 2 (Week 1): Patient will demonstrate functional problem solving for mildly complex tasks with supervision cues.  SLP Short Term Goal 3 (Week 1): Patient will recall new, daily information with use of external aids with supervision cues.  SLP Short Term Goal 4 (Week 1): Patient will self-monitor and correct verbosity during a functional conversation with supervision cues.   Skilled Therapeutic Interventions: Skilled treatment session focused on cognitive goals. SLP facilitated the session by initially providing Mod A verbal and question cues which faded to supervision by end of session for recall of her current medications and their functions. The patient demonstrated verbal perseveration in regards to her vitamins/supplements she was taking at home and required Mod verbal cues for redirection to task and for recall of discussion with Dr. Naaman Plummer in regards to this subject matter. Pt was also perseverative on pain and required Mod verbal cues for initiation of tasks she could perform independently. RN made aware and medication was administered. Patient left in room with friends and family present.  Continue with current plan of care.   FIM:  Comprehension Comprehension Mode: Auditory Comprehension: 5-Understands basic 90% of the time/requires cueing < 10% of the time Expression Expression Mode: Verbal Expression: 5-Expresses basic 90% of the time/requires cueing < 10% of the time. Social Interaction Social Interaction: 3-Interacts appropriately 50 - 74% of the time - May be  physically or verbally inappropriate. Problem Solving Problem Solving: 3-Solves basic 50 - 74% of the time/requires cueing 25 - 49% of the time Memory Memory: 4-Recognizes or recalls 75 - 89% of the time/requires cueing 10 - 24% of the time  Pain Pain Assessment Pain Assessment: 0-10 Pain Score: 8  Pain Type: Surgical pain Pain Location: Back Pain Orientation: Mid;Lower Pain Descriptors / Indicators: Aching Pain Onset: On-going Patients Stated Pain Goal: 1 Pain Intervention(s): Medication (See eMAR);RN made aware;Repositioned Multiple Pain Sites: No  Therapy/Group: Individual Therapy  Milbern Doescher 10/16/2013, 3:23 PM

## 2013-10-16 NOTE — Progress Notes (Signed)
Discussed importance of bowel program with patient. She has ileus post op with abdominal distension and X rays show prominent stool in rectum. She needs to be disimpacted again per nursing but has refused due to complaints of rectal pain. Dicussed that her symptoms are in part due to stool burden in rectum. Also discussed her need for "natural laxatives" and that senna is the main ingredient. Will increase senna but advised patient to allow enema/suppository tomorrow to help make progress with bowel program.
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
## 2013-10-16 NOTE — Progress Notes (Signed)
Occupational Therapy Session Note  Patient Details  Name: Erica [name] MRN: [id number] Date of Birth: 11-10-1933  Today's Date: 10/16/2013 OT Individual Time: 0728-0828 OT Individual Time Calculation (min): 60 min   Short Term Goals: Week 1:  OT Short Term Goal 1 (Week 1): Pt will consistently complete stand pivot transfer with min assist and min cues OT Short Term Goal 2 (Week 1): Pt will complete LB dressing with mod assist using AE, if required OT Short Term Goal 3 (Week 1): Pt will complete 1 grooming task in standing for 30 seconds with min assist  OT Short Term Goal 4 (Week 1): Pt will adhere to precautions during lower body dressing with min cues  Skilled Therapeutic Interventions/Progress Updates:    Pt seen for ADL retraining with focus on activity tolerance, functional transfers, and safety awareness. Pt received supine in bed with daughter [name]) present. Pt requesting to go to bathroom. Pt completed supine>sit with mod assist and mod cues of encouragement. Pt sitting EOB and required 40 min to completed sit>stand with bed elevated to transfer to w/c Pt required max cues from therapist and daughter for encouragement as she was perseverating on pain and knee buckling yesterday. Daughter assisting with providing appropriate cues as well. Completed stand pivot transfer w/c<>toilet with min assist and max cues of encouragement. Pt improved independence in toilet task as she managed clothing down and completed hygiene. Pt left sitting in w/c. Discussed with RN about beginning therapy later in morning and having nursing staff get pt out of bed so pt can accomplish more during therapy sessions.   Therapy Documentation Precautions:  Precautions Precautions: Fall;Back Precaution Booklet Issued: Yes (comment) Precaution Comments: Bone flap in R abdomen Required Braces or Orthoses: Spinal Brace Spinal Brace: Lumbar corset;Applied in sitting position Restrictions Weight Bearing  Restrictions: No General:   Vital Signs: Therapy Vitals Temp: 98.4 F (36.9 C) Temp src: Oral Pulse Rate: 83 Resp: 18 BP: 143/62 mmHg Patient Position (if appropriate): Lying Oxygen Therapy SpO2: 98 % O2 Device: None (Room air) Pain: Pt report pain with movement and in anticipation of movement.   Other Treatments:    See FIM for current functional status  Therapy/Group: Individual Therapy  Duayne Cal 10/16/2013, 8:55 AM

## 2013-10-16 NOTE — Progress Notes (Signed)
Physical Therapy Session Note  Patient Details  Name: Erica [name] MRN: [id number] Date of Birth: [birth year]  Today's Date: 10/16/2013 PT Individual Time: Treatment Session 1: 0900-1000; Treatment Session 2: 1100-1200; Treatment Session 3: 1505-1540  PT Individual Time Calculation (min): Treatment Session 1: 60 min ; Treatment Session 2: 50min; Treatment Session 3: 54min  Short Term Goals: Week 1:  PT Short Term Goal 1 (Week 1): Pt to perform bed mobility with mod A and without use of hosptial bed functions PT Short Term Goal 2 (Week 1): Pt to perform bed<>w/c transfers with min guard A PT Short Term Goal 3 (Week 1): Pt to propel w/c 100' with supervision PT Short Term Goal 4 (Week 1): Pt to ambulate 20' with RW and min guard A PT Short Term Goal 5 (Week 1): Pt to negotiate up/down 3 steps with single rail  Skilled Therapeutic Interventions/Progress Updates:  Treatment Session 1:  1:1. Pt received sitting in w/c, ready for therapy. Focus this session on activity tolerance, sustained attention and initiation. Pt propelled w/c 165' w/ B UE, req close(S)-min A w/ verbal cues and intermittent HOH assist for technique. Pt req max encouragement to participate in standing activity throughout session, w/ max cues to sustain attention to basic functional tasks. Pt req max A for initial t/f sit<>stand w/ RW, however, upon standing pt experienced episode of incontinence that surpassed pt's brief. Pt transported back to room, req max Ax1person for t/f sit<>stand x2 bouts w/ total Ax2 persons for clean up. Pt req significantly increased time to initiate all t/f sit<>stand w/ max multimodal cues due to perseveration on pain and fatigue. Pt req min A for completion of SPT w/c>recliner at end of session. Pt left in recliner w/ all needs in reach and daughter in room.   Treatment Session 2:  1:1. Pt received semi-reclined in recliner, lightly sleeping but easy to wake. Focus this session on activity tolerance,  sustained attention and initiation. Pt req max encouragement to participate in all standing activity during session w/ max cues to sustain attention to basic tasks. Upon standing from recliner w/ RW and mod A, pt again experiencing episode of incontinence but contained by brief. Pt req mod A for ambulation x10' recliner>toilet w/ very slow but consistent pace. Pt req min A for toilet transfer, but total A for clothing management and pericare. Pt req max cues to initiate transfer off of toilet as well as ambulate x10' back to recliner, req initial mod A-total A x1person for completion. Pt complaining of pain, crying out for daughter, requesting for ice chips eventually crying out during bout of ambulation back to room. Daughter sensed that she was distracting to pt and quietly excused herself. Pt demonstrating increased verbal cries when she realized her daughter had left the room, letting B LE give out req total Ax1person to prevent fall. Pt able to stand self back up with min A following max cues for initiation from therapist once she realized the only place for her to rest was the w/c she was amb to. Pt engaged in seated therex to target B LE strength, exercises included 2x10 reps of: ankle pumps, marching, LAQ, glute sets and hip abd squeezes. Pt req mod A to t/f to recliner at end of session w/ all needs in reach, daughter present. Brief discussion with daughter outside of room, emphasizing pt's distractibility and seeking comfort from family during session leading to decreased active participation when present. Pt's daughter verbalized understanding and stating that she  would pass that along to family.  Treatment Session 3: 1:1. Pt received sitting in recliner stating, "Oh honey, I don't know that I'm ready for you again. I've done so much today, I don't think I can do much more." Pt req max encouragement as well as max cues for redirection and initiation of task 14min to sit at edge of chair, perseverating  on pain as well as wanting ice chips. Ice chips placed on tray in front of pt to encourage pt to sit at edge of chair and facilitate anterior weight shift, pt req min A for completion. Attempted additional 52min to engage pt in t/f sit<>stand 1x, pt stating "Oh honey, I just don't think I can do that. I've done so much today and its really too much. I'm just afraid of the pain and not sure that my legs can handle it." Pt educated on importance of active participation in therapy for building strength/endurance, reviewed personal goals and reviewed role of therapies. Despite multiple attempts, pt would not initiate t/f sit<>stand. Pt left in recliner at end of session w/ all needs in reach, quick release belt in place.   Therapy Documentation Precautions:  Precautions Precautions: Fall;Back Precaution Booklet Issued: Yes (comment) Precaution Comments: Bone flap in R abdomen Required Braces or Orthoses: Spinal Brace Spinal Brace: Lumbar corset;Applied in sitting position Restrictions Weight Bearing Restrictions: No  See FIM for current functional status  Therapy/Group: Individual Therapy  Timothy, [place] 10/16/2013, 12:22 PM

## 2013-10-17 ENCOUNTER — Inpatient Hospital Stay (HOSPITAL_COMMUNITY): Payer: Medicare HMO | Admitting: Speech Pathology

## 2013-10-17 ENCOUNTER — Inpatient Hospital Stay (HOSPITAL_COMMUNITY): Payer: Medicare HMO

## 2013-10-17 ENCOUNTER — Encounter (HOSPITAL_COMMUNITY): Payer: Medicare HMO

## 2013-10-17 DIAGNOSIS — S065X9A Traumatic subdural hemorrhage with loss of consciousness of unspecified duration, initial encounter: Secondary | ICD-10-CM

## 2013-10-17 DIAGNOSIS — Z5189 Encounter for other specified aftercare: Secondary | ICD-10-CM

## 2013-10-17 DIAGNOSIS — M461 Sacroiliitis, not elsewhere classified: Secondary | ICD-10-CM

## 2013-10-17 DIAGNOSIS — S065XAA Traumatic subdural hemorrhage with loss of consciousness status unknown, initial encounter: Secondary | ICD-10-CM

## 2013-10-17 DIAGNOSIS — K560 Paralytic ileus: Secondary | ICD-10-CM

## 2013-10-17 DIAGNOSIS — D62 Acute posthemorrhagic anemia: Secondary | ICD-10-CM

## 2013-10-17 DIAGNOSIS — S32009A Unspecified fracture of unspecified lumbar vertebra, initial encounter for closed fracture: Secondary | ICD-10-CM

## 2013-10-17 MED ORDER — GABAPENTIN 100 MG PO CAPS
100.0000 mg | ORAL_CAPSULE | Freq: Three times a day (TID) | ORAL | Status: DC
  Administered 2013-10-17 – 2013-10-22 (×17): 100 mg via ORAL
  Filled 2013-10-17 (×22): qty 1

## 2013-10-17 NOTE — Progress Notes (Signed)
Nursing Note: Pt in chair and wanted to go to bed but states that she feels stool in her rectum that needs to come out.Pt in pain and uncertain if she wants to get in bed or get up to the bsc.Pt assisted to bedside commode w/ maxx assist and maxx cues.Pt had small brown ball of stool.Pt assisted back to bed w/ lift and disimpacted for moderate to large amount of brown hard balls.Pt tolerated well.Pt medicated for pain,sleep and repositioned and is resting quietly in bed.wbb
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
## 2013-10-17 NOTE — Progress Notes (Signed)
Occupational Therapy Session Note  Patient Details  Name: Erica [name] MRN: [id number] Date of Birth: 1933/12/21  Today's Date: 10/17/2013 OT Individual Time: 1000-1016 OT Individual Time Calculation (min): 16 min   Short Term Goals: Week 1:  OT Short Term Goal 1 (Week 1): Pt will consistently complete stand pivot transfer with min assist and min cues OT Short Term Goal 2 (Week 1): Pt will complete LB dressing with mod assist using AE, if required OT Short Term Goal 3 (Week 1): Pt will complete 1 grooming task in standing for 30 seconds with min assist  OT Short Term Goal 4 (Week 1): Pt will adhere to precautions during lower body dressing with min cues  Skilled Therapeutic Interventions/Progress Updates:    Pt seen for 1:1 OT session with focus on bed mobility and activity tolerance. Pt received supine in bed requiring mod cues for participation d/t pain. Completed sit>supine with increased time and HOB elevated. Pt required max cues as she perseverated on pain and max physical assist. Pt required max assist for sitting EOB d/t pain and pt unable to follow simple commands d/t perseveration on pain. Pt returned to supine with total assist. Positioned pillows for comfort. Pt declined further participation stating "I just can't" and "it's too much." Provided education on importance of participation to increase functional independence and pt responded with "maybe another day." Pt left supine in bed with HOB elevated. Pt missing 44 min skilled OT d/t pain.   Therapy Documentation Precautions:  Precautions Precautions: Fall;Back Precaution Booklet Issued: Yes (comment) Precaution Comments: Bone flap in R abdomen Required Braces or Orthoses: Spinal Brace Spinal Brace: Lumbar corset;Applied in sitting position Restrictions Weight Bearing Restrictions: No General: General OT Amount of Missed Time: 44 Minutes PT Missed Treatment Reason: Pain Vital Signs: Therapy Vitals Temp: 98.1 F (36.7  C) Temp src: Oral Pulse Rate: 88 Resp: 17 BP: 174/75 mmHg Patient Position (if appropriate): Lying Oxygen Therapy SpO2: 96 % O2 Device: None (Room air) Pain: Pt reporting 10/10 pain. RN aware.   See FIM for current functional status  Therapy/Group: Individual Therapy  Duayne Cal 10/17/2013, 10:24 AM

## 2013-10-17 NOTE — Progress Notes (Signed)
Physical Therapy Session Note  Patient Details  Name: Erica [name] MRN: [id number] Date of Birth: 08/25/1933  Today's Date: 10/17/2013 PT Individual Time: 1300-1330 PT Individual Time Calculation (min): 30 min   Short Term Goals: Week 1:  PT Short Term Goal 1 (Week 1): Pt to perform bed mobility with mod A and without use of hosptial bed functions PT Short Term Goal 2 (Week 1): Pt to perform bed<>w/c transfers with min guard A PT Short Term Goal 3 (Week 1): Pt to propel w/c 100' with supervision PT Short Term Goal 4 (Week 1): Pt to ambulate 20' with RW and min guard A PT Short Term Goal 5 (Week 1): Pt to negotiate up/down 3 steps with single rail  Skilled Therapeutic Interventions/Progress Updates:  1:1. Pt received semi-reclined in bed, awake and attempting to eat lunch but stated not feeling well. Attempted focus this session on transferring pt to recliner for improved positioning and ability to eat, however, pt incontinent of bowel req extensive clean up. Focus this session on activity tolerance and B rolling w/ use of bed rails for clean up. Pt req max A for rolling due to high level of pain and decreased tolerance to mobility, pt crying out "No no, don't push me please, it hurts so much!" Therapist or daughter stabilizing pt's B LE during clean up, but not pushing her. Use of chux for rolling for increased comfort. Pt req total A for clean up, then incontinent of bowel again, total A for additional clean up. Pt left in care of nurse tech for t/f bed>recliner at end of session to allow clean up of bed. Therapist recommended for nursing with Ax2 persons to t/f pt to recliner w/ drop down arm via lateral scoot for increased safety and pt tolerance.   Therapy Documentation Precautions:  Precautions Precautions: Fall;Back Precaution Booklet Issued: Yes (comment) Precaution Comments: Bone flap in R abdomen Required Braces or Orthoses: Spinal Brace Spinal Brace: Lumbar corset;Applied in  sitting position Restrictions Weight Bearing Restrictions: No  See FIM for current functional status  Therapy/Group: Individual Therapy  Markayla, Reichart 10/17/2013, 2:27 PM

## 2013-10-17 NOTE — Plan of Care (Signed)
Problem: RH BOWEL ELIMINATION Goal: RH STG MANAGE BOWEL WITH ASSISTANCE STG Manage Bowel with moderate Assistance.  Outcome: Not Progressing Pt on meds and has required disimpacting x2.L BM 10/16/13.Manually removed mod-large amount of balls from rectum. Pt tolerated well.wbb

## 2013-10-17 NOTE — Plan of Care (Signed)
Problem: RH SAFETY Goal: RH STG DECREASED RISK OF FALL WITH ASSISTANCE STG Decreased Risk of Fall With minimal Assistance.  Outcome: Progressing Requires constant cues for steps and attention to tasks.
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
## 2013-10-17 NOTE — Progress Notes (Signed)
Nursing Note: pt c/o feeling she needed to have a BM.Pt checked and rectum visibly distended with stool that pt was unable to evacuate.Pt had a HUGE,HUGE soft formed stool after gentle dig. Stim. Pt cleaned and turned onto her side. Abd, is still large ,disteneded.wbb

## 2013-10-17 NOTE — Plan of Care (Signed)
Problem: RH PAIN MANAGEMENT Goal: RH STG PAIN MANAGED AT OR BELOW PT'S PAIN GOAL Pain level 3 or less on a scale of 0-10.  Outcome: Progressing Still has a lot of pain but is having some relief and able to participate w/ own self care.

## 2013-10-17 NOTE — Plan of Care (Signed)
Problem: RH BLADDER ELIMINATION Goal: RH STG MANAGE BLADDER WITH ASSISTANCE STG Manage Bladder With minimal Assistance  Outcome: Not Progressing Voids incont. Staff changing pad at night

## 2013-10-17 NOTE — Progress Notes (Signed)
Physical Therapy Session Note  Patient Details  Name: Erica [name] MRN: [id number] Date of Birth: 1933-12-26  Today's Date: 10/17/2013 PT Individual Time: 0800-0845 PT Individual Time Calculation (min): 45 min   Short Term Goals: Week 1:  PT Short Term Goal 1 (Week 1): Pt to perform bed mobility with mod A and without use of hosptial bed functions PT Short Term Goal 2 (Week 1): Pt to perform bed<>w/c transfers with min guard A PT Short Term Goal 3 (Week 1): Pt to propel w/c 100' with supervision PT Short Term Goal 4 (Week 1): Pt to ambulate 20' with RW and min guard A PT Short Term Goal 5 (Week 1): Pt to negotiate up/down 3 steps with single rail  Skilled Therapeutic Interventions/Progress Updates:  1:1. Pt received in L sidelying sleeping, easy to wake. Granddaughter in room, but departing at start of session. Focus this session on activity tolerance, bed mobility, t/f sit<>stand, sustained attention and initiation. Pt req (S) for rolling to R side and mod A for t/f R sidelying>sit w/ significantly increased time due to pain and max cues for seq. Pt experiencing incontinence of bladder and bowel during t/f, contained by brief. Attempted SPT bed>BSC w/ RW for clean up. Pt req consistent max cues for encouragement and initiation of t/f sit<>stand 4x req mod A, however, unable to maintain standing >3seconds each time with pt crying out in pain. Pt w/ controlled sitting 3/4 attempts, but B knees buckling 1x req total A to safely sit on bed. Noted mechanical jerking in pt's spine during attempts, RN made aware. Pt sitting EOB continued crying out in pain, "I don't know what we are doing! I'm not sure I can do this! Do I need to go over there [BSC]?" Pt appearing unaware of failed attempts for t/f sit<>stand in prep for SPT to [facility], re-oriented to situation multiple times that current goal was for clean up due to incontinence and need for cleanup, "Oh I do?" Pt req total Ax1person for t/f sit>sup due to  pain. Pt with very poor tolerance to B rolling w/ supervision, total A for clean up. Upon repositioning in bed, pt stating "Oh honey, I don't know that I can do any more the pain is just too much!" Pt reporting 10/10 pain in back, RN made aware and present to provide pain meds at end of session. Pt left semi-reclined in bed w/ all needs in reach, eating breakfast following encouragement. Pt missed 43min this session due to pain.   Therapy Documentation Precautions:  Precautions Precautions: Fall;Back Precaution Booklet Issued: Yes (comment) Precaution Comments: Bone flap in R abdomen Required Braces or Orthoses: Spinal Brace Spinal Brace: Lumbar corset;Applied in sitting position Restrictions Weight Bearing Restrictions: No General: PT Amount of Missed Time (min): 15 Minutes PT Missed Treatment Reason: Pain Pain: Pain Assessment Pain Assessment: 0-10 Pain Score: 10-Worst pain ever Faces Pain Scale: Hurts worst Pain Type: Surgical pain Pain Location: Back Pain Orientation: Mid;Lower Pain Descriptors / Indicators: Aching Pain Onset: With Activity Pain Intervention(s): Medication (See eMAR) PAINAD (Pain Assessment in Advanced Dementia) Breathing: normal  See FIM for current functional status  Therapy/Group: Individual Therapy  Lurlene, Ronda 10/17/2013, 8:46 AM

## 2013-10-18 ENCOUNTER — Inpatient Hospital Stay (HOSPITAL_COMMUNITY): Payer: No Typology Code available for payment source | Admitting: *Deleted
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
## 2013-10-18 NOTE — Care Management Note (Signed)
Inpatient [facility] Individual Statement of Services  Patient Name:  Erica [name]  Date:  10/15/2013  Welcome to the [facility].  Our goal is to provide you with an individualized program based on your diagnosis and situation, designed to meet your specific needs.  With this comprehensive rehabilitation program, you will be expected to participate in at least 3 hours of rehabilitation therapies Monday-Friday, with modified therapy programming on the weekends.  Your rehabilitation program will include the following services:  Physical Therapy (PT), Occupational Therapy (OT), Speech Therapy (ST), 24 hour per day rehabilitation nursing, Therapeutic Recreaction (TR), Neuropsychology, Case Management (Social Worker), Rehabilitation Medicine, Nutrition Services and Pharmacy Services  Weekly team conferences will be held on Tuesdays to discuss your progress.  Your Social Worker will talk with you frequently to get your input and to update you on team discussions.  Team conferences with you and your family in attendance may also be held.  Expected length of stay: 14-20 days  Overall anticipated outcome: supervision  Depending on your progress and recovery, your program may change. Your Social Worker will coordinate services and will keep you informed of any changes. Your Social Worker's name and contact numbers are listed  below.  The following services may also be recommended but are not provided by the [facility] will be made to provide these services after discharge if needed.  Arrangements include referral to agencies that provide these services.  Your insurance has been verified to be:  [employer] Your primary doctor is:  Dr. Kathryne Eriksson  Pertinent information will be shared with your doctor and your insurance  company.  Social Worker:  [place], [place] or ([phone]   Information discussed with and copy given to patient by: Lennart Pall, 10/15/2013, 12:32 PM

## 2013-10-18 NOTE — Progress Notes (Signed)
8/22 1830 Patient continues to have severe anxiety related to all transfers with therapy, nursing staff and family at bedside; despite medication changes (increased pain and anxiety medication--see MAR). Patient states, "I'm so afraid of the pain" when prompted to stand.  Needs MAX cueing and minimizing distractions may be helpful.
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
## 2013-10-19 ENCOUNTER — Inpatient Hospital Stay (HOSPITAL_COMMUNITY): Payer: Medicare HMO

## 2013-10-19 ENCOUNTER — Encounter (HOSPITAL_COMMUNITY): Payer: Medicare HMO

## 2013-10-19 ENCOUNTER — Inpatient Hospital Stay (HOSPITAL_COMMUNITY): Payer: Medicare HMO | Admitting: Speech Pathology

## 2013-10-19 DIAGNOSIS — K560 Paralytic ileus: Secondary | ICD-10-CM

## 2013-10-19 DIAGNOSIS — S065X9A Traumatic subdural hemorrhage with loss of consciousness of unspecified duration, initial encounter: Secondary | ICD-10-CM

## 2013-10-19 DIAGNOSIS — S065XAA Traumatic subdural hemorrhage with loss of consciousness status unknown, initial encounter: Secondary | ICD-10-CM

## 2013-10-19 DIAGNOSIS — Z5189 Encounter for other specified aftercare: Secondary | ICD-10-CM

## 2013-10-19 DIAGNOSIS — M461 Sacroiliitis, not elsewhere classified: Secondary | ICD-10-CM

## 2013-10-19 DIAGNOSIS — S32009A Unspecified fracture of unspecified lumbar vertebra, initial encounter for closed fracture: Secondary | ICD-10-CM

## 2013-10-19 DIAGNOSIS — D62 Acute posthemorrhagic anemia: Secondary | ICD-10-CM

## 2013-10-19 LAB — URINALYSIS, ROUTINE W REFLEX MICROSCOPIC
BILIRUBIN URINE: NEGATIVE
GLUCOSE, UA: NEGATIVE mg/dL
HGB URINE DIPSTICK: NEGATIVE
Ketones, ur: NEGATIVE mg/dL
Leukocytes, UA: NEGATIVE
Nitrite: NEGATIVE
PROTEIN: NEGATIVE mg/dL
Specific Gravity, Urine: 1.008 (ref 1.005–1.030)
UROBILINOGEN UA: 0.2 mg/dL (ref 0.0–1.0)
pH: 7 (ref 5.0–8.0)

## 2013-10-19 MED ORDER — BETHANECHOL CHLORIDE 25 MG PO TABS
25.0000 mg | ORAL_TABLET | Freq: Four times a day (QID) | ORAL | Status: DC
  Administered 2013-10-19 – 2013-10-22 (×13): 25 mg via ORAL
  Filled 2013-10-19 (×21): qty 1

## 2013-10-19 NOTE — Plan of Care (Signed)
Problem: RH BLADDER ELIMINATION Goal: RH STG MANAGE BLADDER WITH ASSISTANCE STG Manage Bladder With minimal Assistance  Outcome: Not Progressing Requires I/O cath for urinary retention
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
## 2013-10-19 NOTE — Progress Notes (Addendum)
Physical Therapy Session Note  Patient Details  Name: Erica [name] MRN: [id number] Date of Birth: [birth year]  Today's Date: 10/19/2013 PT Individual Time: Treatment Session 1: [id number]; Treatment Session 2: 1500-1600 PT Individual Time Calculation (min): Treatment Session 1: 45 min; Treatment Session 2: 53min  Short Term Goals: Week 1:  PT Short Term Goal 1 (Week 1): Pt to perform bed mobility with mod A and without use of hosptial bed functions PT Short Term Goal 2 (Week 1): Pt to perform bed<>w/c transfers with min guard A PT Short Term Goal 3 (Week 1): Pt to propel w/c 100' with supervision PT Short Term Goal 4 (Week 1): Pt to ambulate 20' with RW and min guard A PT Short Term Goal 5 (Week 1): Pt to negotiate up/down 3 steps with single rail  Skilled Therapeutic Interventions/Progress Updates:  1:1. Pt received sitting in recliner, ready for therapy. Focus this session on activity tolerance, t/f sit<>stand, standing tolerance and therex. Pt req consistent max encouragement to participate throughout session due to perseveration on pain, fear of pain, needing her back scratched, wanting ice water, wondering if the female OT who she saw earlier was coming to help. Pt also stating, "I've just done so much today already, my legs are so weak, this is just all so traumatic." Pt educated on importance of therapy, reviewed pt's personal goals- wanting to go home, re-emphasized active participation in therapy to return home vs. SNF. Pt repeatedly saying she wanted to do better and participate in therapy, but continuously coming up with excuses as to why she could not try. Pt eventually standing after 53min with therapist cueing from 5 second count down. Pt req max A to scoot to edge of chair, mod A to t/f sit<>stand and able to maintain static standing w/ min A x3'. Therapist attempting to distract pt by talking about cards, flower and artwork on her wall. Pt req max A to stand 2x following prolonged bout  of encouragement to try. Pt able to maintain standing to complete 2x10 heel raises, toe raises and mini squats. Pt performed seated exercises to target B LE strength, exercises included 2x10 reps of: heel raises, toe raises, marching, LAQ, hip add squeezes and resisted hip abd. Attempted encouragement for another trial of standing, but pt stating "Oh, I just can't do any more. I am so tired." Pt directed to therapy schedule for afternoon, stating "Oh my I don't know how I'm going to be able to do all that." Pt left sitting in recliner w/ all needs in reach. RN made aware of pt's high reports of pain throughout session, but unrated.   Treatment Session 2:  1:1. Pt received sitting in recliner, stating "Oh my, we have another session again?" Pt educated on goals for therapy. Focus this session on sustained attention, activity tolerance, initiation and functional transfers. Pt req max multimodal cues throughout session for sustained attention to task, initiation and active participation. Pt consistently distracted from internal thoughts regarding pain,  need for back rub, fear of muscle fatigue. Pt stating, "Oh I'm just so tired, I don't want to overwork these muscles surely you understand that." Pt req max A to t/f sit>stand 4x due to lack of initiation, but min A for all t/f stand>sit. Pt able to maintain standing for min-mod A ranging from 39minutes to 26minutes while checking laundry, performing SPT recliner<>w/c, standing at sink, then brushing teeth at sink. Pt demonstrating good tolerance to stepping 4 steps forward/back as well as 4 sidesteps.  Pt left sitting in recliner at end of session w/ all needs in reach.     Therapy Documentation Precautions:  Precautions Precautions: Fall;Back Precaution Booklet Issued: Yes (comment) Precaution Comments: Bone flap in R abdomen Required Braces or Orthoses: Spinal Brace Spinal Brace: Lumbar corset;Applied in sitting position Other Brace/Splint: Aspecn crosett  don in sitting and for comfort Restrictions Weight Bearing Restrictions: No General: PT Amount of Missed Time (min): 15 Minutes PT Missed Treatment Reason: Pain;Patient fatigue   Pain: Pain Assessment Pain Score: 8  Pain Type: Acute pain Pain Location: Back Pain Orientation: Lower Pain Descriptors / Indicators: Aching Pain Onset: With Activity Pain Intervention(s): RN made aware;Repositioned  See FIM for current functional status  Therapy/Group: Individual Therapy  Cerissa, Zeiger 10/19/2013, 12:15 PM

## 2013-10-19 NOTE — Progress Notes (Signed)
Occupational Therapy Session Note  Patient Details  Name: Erica [name] MRN: [id number] Date of Birth: 03-Feb-1934  Today's Date: 10/19/2013 OT Individual Time: 3086-5784 OT Individual Time Calculation (min): 45 min   Short Term Goals: Week 1:  OT Short Term Goal 1 (Week 1): Pt will consistently complete stand pivot transfer with min assist and min cues OT Short Term Goal 2 (Week 1): Pt will complete LB dressing with mod assist using AE, if required OT Short Term Goal 3 (Week 1): Pt will complete 1 grooming task in standing for 30 seconds with min assist  OT Short Term Goal 4 (Week 1): Pt will adhere to precautions during lower body dressing with min cues  Skilled Therapeutic Interventions/Progress Updates:    Pt resting in bed upon arrival but agreeable (after min encouragement) to participating in bathing and dressing with sit<>stand from recliner.  Pt required max A for supine->sit EOB and max A for stand pivot transfer to recliner.  Pt initiated all bathing tasks when presented bathing supplies but required constant encouragement throughout session to continue with tasks.  Pt declined bathing buttocks and perineal area stating that nursing had just completed tasks prior to therapy session.  Pt required tot A + 2 to pull up pants.  Pt continues to require max encouragement to participate in therapy sessions. Focus on active participation, activity tolerance, bed mobility, sit<>stand, standing tolerance/balance, task initiation and safety awareness.  Therapy Documentation Precautions:  Precautions Precautions: Fall;Back Precaution Booklet Issued: Yes (comment) Precaution Comments: Bone flap in R abdomen Required Braces or Orthoses: Spinal Brace Spinal Brace: Lumbar corset;Applied in sitting position Other Brace/Splint: Aspecn crosett don in sitting and for comfort Restrictions Weight Bearing Restrictions: No Pain: Pain Assessment Pain Assessment: 0-10 Pain Score: 8  Pain Type: Acute  pain Pain Location: Back Pain Orientation: Lower Pain Descriptors / Indicators: Aching Pain Onset: With Activity Pain Intervention(s): RN made aware;Repositioned  See FIM for current functional status  Therapy/Group: Individual Therapy  Leroy Libman 10/19/2013, 9:29 AM

## 2013-10-19 NOTE — Progress Notes (Signed)
Speech Language Pathology Daily Session Note  Patient Details  Name: Erica [name] MRN: [id number] Date of Birth: 06-14-1933  Today's Date: 10/19/2013 SLP Individual Time: 1005-1102 SLP Individual Time Calculation (min): 57 min  Short Term Goals: Week 1: SLP Short Term Goal 1 (Week 1): Patient will demonstrate sustained attention to a functional and familiar task with supervision cues for 60 minutes.  SLP Short Term Goal 2 (Week 1): Patient will demonstrate functional problem solving for mildly complex tasks with supervision cues.  SLP Short Term Goal 3 (Week 1): Patient will recall new, daily information with use of external aids with supervision cues.  SLP Short Term Goal 4 (Week 1): Patient will self-monitor and correct verbosity during a functional conversation with supervision cues.   Skilled Therapeutic Interventions:  Pt was seen for skilled speech therapy targeting cognitive goals.  Upon arrival, pt was upright in recliner, awake, alert, and agreeable to participate in [place].  Overall, pt benefited from min cuing for redirection to functional conversations related to current goals and progress in therapy as she was noted to be verbose with decreased topic maintenance.  Pt was able to recall at least two details from previous therapy session including name of therapist and 1 therapy activity with min question cues.  With the use of a written aid which was established in previous ST therapy sessions, pt was able to recall function of medications when named with ~75% accuracy with mod assist instructional cues, although she required min cuing for redirection as she was noted to perseverate on expressing her desire to return to the use of holistic medications versus traditional medications prescribed by MD.  Handed off to PT for next therapy session.  Continue per current plan of care.    FIM:  Comprehension Comprehension Mode: Auditory Comprehension: 4-Understands basic 75 - 89% of the  time/requires cueing 10 - 24% of the time Expression Expression Mode: Verbal Expression: 5-Expresses basic 90% of the time/requires cueing < 10% of the time. Social Interaction Social Interaction: 3-Interacts appropriately 50 - 74% of the time - May be physically or verbally inappropriate. Problem Solving Problem Solving: 3-Solves basic 50 - 74% of the time/requires cueing 25 - 49% of the time Memory Memory: 3-Recognizes or recalls 50 - 74% of the time/requires cueing 25 - 49% of the time  Pain Pain Assessment Pain Assessment: No/denies pain   Therapy/Group: Individual Therapy  Windell Moulding, M.A. CCC-SLP  Schwanda Zima, Selinda Orion 10/19/2013, 3:25 PM

## 2013-10-19 NOTE — Progress Notes (Signed)
Occupational Therapy Session Note  Patient Details  Name: Erica [name] MRN: [id number] Date of Birth: [birth year]  Today's Date: 10/19/2013 OT Individual Time: 1300-1315 OT Individual Time Calculation (min): 15 min   Short Term Goals: Week 1:  OT Short Term Goal 1 (Week 1): Pt will consistently complete stand pivot transfer with min assist and min cues OT Short Term Goal 2 (Week 1): Pt will complete LB dressing with mod assist using AE, if required OT Short Term Goal 3 (Week 1): Pt will complete 1 grooming task in standing for 30 seconds with min assist  OT Short Term Goal 4 (Week 1): Pt will adhere to precautions during lower body dressing with min cues  Skilled Therapeutic Interventions/Progress Updates:    Pt sitting in recliner with a cup of tea in her hands.  Pt's eyes were half closed but she attended to OT when spoken to.  Pt initially engaged in sit<> with tot A to initiate and max A to perform task.  Pt required tot A + 2 to pull up pants.  Pt stated that she couldn't do anymore because it hurt too much.  Encouragement and emotional support offered but patient continued to decline further therapy at this time.  Pt also stated she was "worn out" from morning therapies.  Focus on activity tolerance, sit<>stand, and active participation.   Therapy Documentation Precautions:  Precautions Precautions: Fall;Back Precaution Booklet Issued: Yes (comment) Precaution Comments: Bone flap in R abdomen Required Braces or Orthoses: Spinal Brace Spinal Brace: Lumbar corset;Applied in sitting position Other Brace/Splint: Aspecn crosett don in sitting and for comfort Restrictions Weight Bearing Restrictions: No General: General OT Amount of Missed Time: 30 Minutes OT Missed Treatment Reason: Pain;Patient fatigue   Pain: Pain Assessment Pain Assessment: 0-10 Pain Score: 8  Pain Type: Acute pain Pain Location: Back Pain Orientation: Lower Pain Descriptors / Indicators:  Aching;Shooting;Sharp Pain Onset: With Activity Pain Intervention(s): RN made aware;Repositioned  See FIM for current functional status  Therapy/Group: Individual Therapy  Leroy Libman 10/19/2013, 1:32 PM

## 2013-10-20 ENCOUNTER — Inpatient Hospital Stay (HOSPITAL_COMMUNITY): Payer: Medicare HMO | Admitting: Physical Therapy

## 2013-10-20 ENCOUNTER — Inpatient Hospital Stay (HOSPITAL_COMMUNITY): Payer: Medicare HMO

## 2013-10-20 ENCOUNTER — Encounter (HOSPITAL_COMMUNITY): Payer: Medicare HMO

## 2013-10-20 ENCOUNTER — Inpatient Hospital Stay (HOSPITAL_COMMUNITY): Payer: No Typology Code available for payment source | Admitting: Speech Pathology

## 2013-10-20 ENCOUNTER — Inpatient Hospital Stay (HOSPITAL_COMMUNITY): Payer: No Typology Code available for payment source

## 2013-10-20 DIAGNOSIS — S065X9A Traumatic subdural hemorrhage with loss of consciousness of unspecified duration, initial encounter: Secondary | ICD-10-CM

## 2013-10-20 DIAGNOSIS — S065XAA Traumatic subdural hemorrhage with loss of consciousness status unknown, initial encounter: Secondary | ICD-10-CM

## 2013-10-20 DIAGNOSIS — K560 Paralytic ileus: Secondary | ICD-10-CM

## 2013-10-20 DIAGNOSIS — D62 Acute posthemorrhagic anemia: Secondary | ICD-10-CM

## 2013-10-20 DIAGNOSIS — Z5189 Encounter for other specified aftercare: Secondary | ICD-10-CM

## 2013-10-20 DIAGNOSIS — M461 Sacroiliitis, not elsewhere classified: Secondary | ICD-10-CM

## 2013-10-20 DIAGNOSIS — S32009A Unspecified fracture of unspecified lumbar vertebra, initial encounter for closed fracture: Secondary | ICD-10-CM

## 2013-10-20 MED ORDER — DIAZEPAM 5 MG PO TABS
5.0000 mg | ORAL_TABLET | Freq: Once | ORAL | Status: AC
  Administered 2013-10-20: 5 mg via ORAL
  Filled 2013-10-20: qty 1

## 2013-10-20 MED ORDER — TAMSULOSIN HCL 0.4 MG PO CAPS
0.4000 mg | ORAL_CAPSULE | Freq: Every day | ORAL | Status: DC
  Administered 2013-10-20 – 2013-10-22 (×3): 0.4 mg via ORAL
  Filled 2013-10-20 (×4): qty 1

## 2013-10-20 MED ORDER — LIDOCAINE 5 % EX PTCH
2.0000 | MEDICATED_PATCH | Freq: Once | CUTANEOUS | Status: AC
  Administered 2013-10-20: 2 via TRANSDERMAL
  Filled 2013-10-20: qty 2

## 2013-10-20 MED ORDER — LIDOCAINE 5 % EX PTCH
2.0000 | MEDICATED_PATCH | CUTANEOUS | Status: DC
  Administered 2013-10-21 – 2013-10-23 (×3): 2 via TRANSDERMAL
  Filled 2013-10-20 (×4): qty 2

## 2013-10-20 MED ORDER — TRAMADOL HCL 50 MG PO TABS
50.0000 mg | ORAL_TABLET | Freq: Four times a day (QID) | ORAL | Status: DC
  Administered 2013-10-20 – 2013-10-22 (×8): 50 mg via ORAL
  Filled 2013-10-20 (×9): qty 1
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
## 2013-10-20 NOTE — Progress Notes (Addendum)
Physical Therapy Session Note  Patient Details  Name: Erica [name] MRN: [id number] Date of Birth: [birth year]  Today's Date: 10/20/2013 PT Individual Time:  1441-1511  Time Calculation (min): 30 min  Short Term Goals: Week 1:  PT Short Term Goal 1 (Week 1): Pt to perform bed mobility with mod A and without use of hosptial bed functions PT Short Term Goal 2 (Week 1): Pt to perform bed<>w/c transfers with min guard A PT Short Term Goal 3 (Week 1): Pt to propel w/c 100' with supervision PT Short Term Goal 4 (Week 1): Pt to ambulate 20' with RW and min guard A PT Short Term Goal 5 (Week 1): Pt to negotiate up/down 3 steps with single rail  Skilled Therapeutic Interventions/Progress Updates:    Pt received seated in recliner wearing lumbar corset. Pt accompanied by family members, who departed soon after arrival of this PT. Pt attempting to decline therapy session, stating, "This has really been a traumatic day. You should just massage my legs to improve my circulation." Educated pt that physical activity will increase circulation. Also reiterated expectation that pt consistently participate in therapies to maximize safety/independence with functional mobility, self care. Pt amenable to LE therapeutic exercise but adamantly refusing active movement of spine due to fear of pain. Pt performed the following bilat LE therex x20 reps per side with no discomfort: adductor pillow squeezes, LAQ, LAQ with concurrent adductor pillow squeeze, active hip flexion, active ankle plantar/dorsiflexion. Attempted to educate pt on importance of performing pressure relief (while adhering to spinal precautions) to preserve skin integrity. Pt replied, "The doctor didn't say anything about pressure relief. He said I need to rest so I can heal." With max coaxing and education, pt did perform partial anterior weight shift while maintaining erect spine. Pt demonstrated no overt signs of discomfort. Therapist departed with pt  seated in recliner in no apparent distress with quick release belt on and all needs within reach.  Therapy Documentation Precautions:  Precautions Precautions: Fall;Back Precaution Booklet Issued: Yes (comment) Precaution Comments: Bone flap in R abdomen Required Braces or Orthoses: Spinal Brace Spinal Brace: Lumbar corset;Applied in sitting position Other Brace/Splint: Aspecn crosett don in sitting and for comfort Restrictions Weight Bearing Restrictions: No Vital Signs: Therapy Vitals Temp: 98.9 F (37.2 C) Temp src: Oral Pulse Rate: 100 Resp: 18 BP: 179/62 mmHg Oxygen Therapy SpO2: 98 % O2 Device: None (Room air) Pain:   Pain Assessment  Pain Assessment: 0-10  Pain Score: 8  Pain Type: Surgical pain  Pain Location: Back  Pain Orientation: Mid;Lower  Pain Descriptors / Indicators: Aching  Pain Onset: On-going  Pain Intervention(s): Emotional Support; Repositioned   See FIM for current functional status  Therapy/Group: Individual Therapy  Erica [name], Erica [name] 10/20/2013, 9:56 PM

## 2013-10-20 NOTE — Plan of Care (Signed)
Problem: RH Balance Goal: LTG Patient will maintain dynamic standing with ADLs (OT) LTG: Patient will maintain dynamic standing balance with assist during activities of daily living (OT)  Downgraded 8/25 due to decreased activity tolerance, initiation, sustained attention, balance, and strength     Problem: RH Bathing Goal: LTG Patient will bathe with assist, cues/equipment (OT) LTG: Patient will bathe specified number of body parts with assist with/without cues using equipment (position) (OT)  Downgraded 8/25 due to decreased activity tolerance, initiation, sustained attention, balance, and strength      Problem: RH Dressing Goal: LTG Patient will perform upper body dressing (OT) LTG Patient will perform upper body dressing with assist, with/without cues (OT).  Downgraded 8/25 due to decreased activity tolerance, initiation, sustained attention, balance, and strength     Goal: LTG Patient will perform lower body dressing w/assist (OT) LTG: Patient will perform lower body dressing with assist, with/without cues in positioning using equipment (OT)  Downgraded 8/25 due to decreased activity tolerance, initiation, sustained attention, balance, and strength      Problem: RH Toileting Goal: LTG Patient will perform toileting w/assist, cues/equip (OT) LTG: Patient will perform toiletiing (clothes management/hygiene) with assist, with/without cues using equipment (OT)  Downgraded 8/25 due to decreased activity tolerance, initiation, sustained attention, balance, and strength      Problem: RH Toilet Transfers Goal: LTG Patient will perform toilet transfers w/assist (OT) LTG: Patient will perform toilet transfers with assist, with/without cues using equipment (OT)  Downgraded 8/25 due to decreased activity tolerance, initiation, sustained attention, balance, and strength      Problem: RH Tub/Shower Transfers Goal: LTG Patient will perform tub/shower transfers w/assist (OT) LTG:  Patient will perform tub/shower transfers with assist, with/without cues using equipment (OT)  Downgraded 8/25 due to decreased activity tolerance, initiation, sustained attention, balance, and strength
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
## 2013-10-20 NOTE — Plan of Care (Signed)
Problem: RH Balance Goal: LTG Patient will maintain dynamic sitting balance (PT) LTG: Patient will maintain dynamic sitting balance with assistance during mobility activities (PT)  Downgraded due to decreased activity tolerance, initiation, sustained attention, balance Goal: LTG Patient will maintain dynamic standing balance (PT) LTG: Patient will maintain dynamic standing balance with assistance during mobility activities (PT)  Downgraded due to decreased activity tolerance, initiation, sustained attention, balance  Problem: RH Bed Mobility Goal: LTG Patient will perform bed mobility with assist (PT) LTG: Patient will perform bed mobility with assistance, with/without cues (PT).  Downgraded due to decreased activity tolerance, initiation, sustained attention, balance  Problem: RH Car Transfers Goal: LTG Patient will perform car transfers with assist (PT) LTG: Patient will perform car transfers with assistance (PT).  Downgraded due to decreased activity tolerance, initiation, sustained attention, balance  Problem: RH Furniture Transfers Goal: LTG Patient will perform furniture transfers w/assist (OT/PT LTG: Patient will perform furniture transfers with assistance (OT/PT).  Downgraded due to decreased activity tolerance, initiation, sustained attention, balance  Problem: RH Ambulation Goal: LTG Patient will ambulate in controlled environment (PT) LTG: Patient will ambulate in a controlled environment, # of feet with assistance (PT).  Downgraded due to decreased activity tolerance, initiation, sustained attention, balance Goal: LTG Patient will ambulate in home environment (PT) LTG: Patient will ambulate in home environment, # of feet with assistance (PT).  Downgraded due to decreased activity tolerance, initiation, sustained attention, balance  Problem: RH Stairs Goal: LTG Patient will ambulate up and down stairs w/assist (PT) LTG: Patient will ambulate up and down # of stairs with  assistance (PT)  Downgraded due to decreased activity tolerance, initiation, sustained attention, balance

## 2013-10-20 NOTE — Progress Notes (Signed)
Occupational Therapy Weekly Progress Note  Patient Details  Name: Erica [name] MRN: [id number] Date of Birth: 1933/08/29  Beginning of progress report period: October 13, 2013 End of progress report period: October 20, 2013   Patient has met 0 of 4 short term goals.  Pt has made minimal and inconsistent progress with BADLs since admission.  Pt requires max/tot A for BADLs. Pt requires max verbal cues/encouragement to actively participate in therapy session.  Pt often does not actively participate in transfers and requires tot A for squat pivot transfers and sit<>stand to facilitate pulling up pants.  Pt requires max verbal cues and extra time to initiate transitional movements.    Patient continues to demonstrate the following deficits: decreased activity tolerance, decreased strength, decreased initiation, decreased safety awareness, decreased balance, decreased insight to deficits, decreased coordination, impaired cognition and therefore will continue to benefit from skilled OT intervention to enhance overall performance with BADLs, reduce care partner burden, increase strength, increase activity tolerance, increase ability to compensate for deficits, improve balance.  Patient not progressing toward long term goals.  See goal revision..  Plan of care revisions: Goals downgraded to mod assist overall due to decreased strength, activity tolerance, balance, sustained attention, and initiation.  OT Short Term Goals Week 1:  OT Short Term Goal 1 (Week 1): Pt will consistently complete stand pivot transfer with min assist and min cues OT Short Term Goal 1 - Progress (Week 1): Revised due to lack of progress OT Short Term Goal 2 (Week 1): Pt will complete LB dressing with mod assist using AE, if required OT Short Term Goal 2 - Progress (Week 1): Progressing toward goal OT Short Term Goal 3 (Week 1): Pt will complete 1 grooming task in standing for 30 seconds with min assist  OT Short Term Goal 3 -  Progress (Week 1): Revised due to lack of progress OT Short Term Goal 4 (Week 1): Pt will adhere to precautions during lower body dressing with min cues OT Short Term Goal 4 - Progress (Week 1): Progressing toward goal Week 2:  OT Short Term Goal 1 (Week 2): Pt will perform functiuonal transfers with mod A OT Short Term Goal 2 (Week 2): Pt will perform LB dressing with max A using AE PRN OT Short Term Goal 3 (Week 2): Pt will perform toileting tasks with max A OT Short Term Goal 4 (Week 2): Pt will adhere to precautions during LB dressing with min verbal cues.       Therapy Documentation Precautions:  Precautions Precautions: Fall;Back Precaution Booklet Issued: Yes (comment) Precaution Comments: Bone flap in R abdomen Required Braces or Orthoses: Spinal Brace Spinal Brace: Lumbar corset;Applied in sitting position Other Brace/Splint: Aspecn crosett don in sitting and for comfort Restrictions Weight Bearing Restrictions: No  See FIM for current functional status  Leroy Libman 10/20/2013, 2:36 PM

## 2013-10-20 NOTE — Plan of Care (Signed)
Problem: RH Memory Goal: LTG Patient demonstrate ability for day to day recall (PT) LTG: Patient will demonstrate ability for day to day recall/carryover during mobility activities with assist (PT)  Downgraded due to decreased activity tolerance, initiation, sustained attention, balance  Problem: RH Awareness Goal: LTG: Patient will demonstrate intellectual/emergent (PT) LTG: Patient will demonstrate intellectual/emergent/anticipatory awareness with assist during a mobility activity (PT)  Downgraded due to decreased activity tolerance, initiation, sustained attention, balance

## 2013-10-20 NOTE — Progress Notes (Signed)
Pt's care team in agreement to decreased pt's therapy schedule from 4.5hr to 3hr/day due to decreased activity tolerance overall.  Gilmore Laroche, PT, DPT  10/20/2013, 3:28 PM

## 2013-10-20 NOTE — Progress Notes (Signed)
Occupational Therapy Session Note  Patient Details  Name: Erica [name] MRN: [id number] Date of Birth: 26-Oct-1933  Today's Date: 10/20/2013 OT Individual Time: 0700-0800 OT Individual Time Calculation (min): 60 min   Short Term Goals: Week 1:  OT Short Term Goal 1 (Week 1): Pt will consistently complete stand pivot transfer with min assist and min cues OT Short Term Goal 2 (Week 1): Pt will complete LB dressing with mod assist using AE, if required OT Short Term Goal 3 (Week 1): Pt will complete 1 grooming task in standing for 30 seconds with min assist  OT Short Term Goal 4 (Week 1): Pt will adhere to precautions during lower body dressing with min cues  Skilled Therapeutic Interventions/Progress Updates:    Pt seated in recliner with son present.  Pt agreeable to bathing at shower level and dressing with sit<>stand to pull up pants.  Pt required max A for all squat pivot transfers.  Pt noted with increased anxiety with all transitional movements and unwillingness to actively participate in transfers without max multimodal cues. Pt required extra time to complete all tasks and required max verbal cues to transition from task to task.  Pt initially responds to all requests with "I can't do this" or "I don't think I can." Focus on activity tolerance, transfers, sit<>stand, standing balance, task initiation, and active participation.  Therapy Documentation Precautions:  Precautions Precautions: Fall;Back Precaution Booklet Issued: Yes (comment) Precaution Comments: Bone flap in R abdomen Required Braces or Orthoses: Spinal Brace Spinal Brace: Lumbar corset;Applied in sitting position Other Brace/Splint: Aspecn crosett don in sitting and for comfort Restrictions Weight Bearing Restrictions: No Pain: Pain Assessment Pain Assessment: 0-10 Pain Score: 9  Pain Type: Surgical pain Pain Location: Back Pain Orientation: Lower Pain Descriptors / Indicators: Aching Pain Frequency:  Constant Pain Onset: With Activity Pain Intervention(s):  (medicated by previous RN Jeannene Patella Love PA to give new order)  See FIM for current functional status  Therapy/Group: Individual Therapy  Leroy Libman 10/20/2013, 9:24 AM

## 2013-10-20 NOTE — Progress Notes (Signed)
Physical Therapy Session Note  Patient Details  Name: Erica [name] MRN: [id number] Date of Birth: [birth year]  Today's Date: 10/20/2013 PT Individual Time: 0900-0935 PT Individual Time Calculation (min): 35 min   Short Term Goals: Week 1:  PT Short Term Goal 1 (Week 1): Pt to perform bed mobility with mod A and without use of hosptial bed functions PT Short Term Goal 2 (Week 1): Pt to perform bed<>w/c transfers with min guard A PT Short Term Goal 3 (Week 1): Pt to propel w/c 100' with supervision PT Short Term Goal 4 (Week 1): Pt to ambulate 20' with RW and min guard A PT Short Term Goal 5 (Week 1): Pt to negotiate up/down 3 steps with single rail  Skilled Therapeutic Interventions/Progress Updates:  1:1. Pt received sitting in w/c, stating "Oh honey, I just don't think I can do anything more. I have already had so much therapy. It was just all so traumatic, I really need a rest break." Pt educated on importance of participation in therapies for overall recovery. RN in room, just provided pain medicine to address pt's 9/10 pain. Pt agreeable to seated therex for LE strengthening, exercises included 2x10 reps of: ankle pumps/circles, marces, LAQ, resisted hip abd and hip add squeezes. Pt w/ excellent tolerance to LE therex and no reports of pain. Pt noted to be sitting with very poor posture in w/c and w/c cushion in place backwards. Attempted to use this as encouragement to help pt transfer for recliner for increased pain relief and sitting tolerance. Despite max multimodal cues, pt would not initiate t/f due to excuses of: pain, need for ice, need for heat on back, fear of falling, poor strength. Pt then resisting attempt of therapist to perform total squat pivot t/f w/c>recliner. Pt stating, "well maybe the nurse would have a better idea on how to do this." Pt educated on professional role of physical therapist regarding safe advancement of pt's overall mobility and that nursing staff would look to  therapist's recommendation. Pt ultimately req, total Ax2persons for completion of squat pivot t/f to recliner so pt in more safe sitting position with improved postural support. Pt with no verbalizations of pain during t/f, but stating "That was just horrible!" Pt then confirming that she felt increased comfort and less pain sitting in recliner. Pt left w/ all needs in reach. Pt missed 15min of scheduled skilled physical therapy this session due to pain, fatigue and unwillingness to participate.   Therapy Documentation Precautions:  Precautions Precautions: Fall;Back Precaution Booklet Issued: Yes (comment) Precaution Comments: Bone flap in R abdomen Required Braces or Orthoses: Spinal Brace Spinal Brace: Lumbar corset;Applied in sitting position Other Brace/Splint: Aspecn crosett don in sitting and for comfort Restrictions Weight Bearing Restrictions: No Pain: Pain Assessment Pain Assessment: 0-10 Pain Score: 9  Pain Type: Surgical pain Pain Location: Back Pain Orientation: Mid;Lower Pain Descriptors / Indicators: Aching Pain Frequency: Constant Pain Onset: On-going Pain Intervention(s): Repositioned  See FIM for current functional status  Therapy/Group: Individual Therapy  Shawntae, Lowy 10/20/2013, 9:48 AM

## 2013-10-20 NOTE — Progress Notes (Signed)
Speech Language Pathology Weekly Progress and Session Note  Patient Details  Name: Erica [name] MRN: [id number] Date of Birth: [birth year]  Beginning of progress report period: October 13, 2013 End of progress report period: October 20, 2013  Today's Date: 10/20/2013 SLP Individual Time: 0800-0900 SLP Individual Time Calculation (min): 60 min  Short Term Goals: Week 1: SLP Short Term Goal 1 (Week 1): Patient will demonstrate sustained attention to a functional and familiar task with supervision cues for 60 minutes.  SLP Short Term Goal 1 - Progress (Week 1): Not met SLP Short Term Goal 2 (Week 1): Patient will demonstrate functional problem solving for mildly complex tasks with supervision cues.  SLP Short Term Goal 2 - Progress (Week 1): Not met SLP Short Term Goal 3 (Week 1): Patient will recall new, daily information with use of external aids with supervision cues.  SLP Short Term Goal 3 - Progress (Week 1): Not met SLP Short Term Goal 4 (Week 1): Patient will self-monitor and correct verbosity during a functional conversation with supervision cues.  SLP Short Term Goal 4 - Progress (Week 1): Not met    New Short Term Goals: Week 2: SLP Short Term Goal 1 (Week 2): Patient will demonstrate selective attention to a functional and familiar task with Min cues for 60 minutes.  SLP Short Term Goal 2 (Week 2): Patient will demonstrate functional problem solving for basic and familiar tasks with Min cues.  SLP Short Term Goal 3 (Week 2): Patient will recall new, daily information with use of external aids with Min cues.  SLP Short Term Goal 4 (Week 2): Patient will self-monitor and correct verbosity during a functional conversation with Min cues.   Weekly Progress Updates: Pt has made minimal gains and has met 0 of 4 STG's this reporting period. Pt's overall progress is impacted by decreased motivation/initiation which is exacerbated by pain and anxiety.  Currently, pt requires overall Mod-Max  A for sustained attention, recall of new and daily information, functional problem solving and awareness. Patient/family education ongoing and pt would benefit from continued skilled SLP intervention to maximize her cognitive function in order to maximize her overall functional independence. Pt's LTG's will be downgraded due to lack of progress.    Intensity: Minumum of 1-2 x/day, 30 to 90 minutes Frequency: 5 out of 7 days Duration/Length of Stay: 10/24/13 Treatment/Interventions: Cognitive remediation/compensation;Cueing hierarchy;Functional tasks;Environmental controls;Internal/external aids;Patient/family education;Therapeutic Activities   Daily Session Skilled Therapeutic Interventions: Skilled treatment session focused on cognitive-lingusitic goals. Upon arrival, pt was sitting upright in the wheelchair and implied that she was unable to participate in treatment session due to pain and fatigue. SLP facilitated session by providing verbal encourgament and utilizing a functional task of self-feeding to increase participation. Pt required Mod A verbal cues for functional problem solving with tray set-up and Mod verbal cues to initiate functional tasks instead of asking for assistance. Pt was perseverative on pain throughout the session and required Mod verbal cues for selective attention to self-feeding in a mildly distracting environment. Pt also required Max A multimodal cues for recall of functional information such as time she received her medications and daily schedule. Pt left in room with nurse present. Continue with current plan of care.    FIM:  Comprehension Comprehension Mode: Auditory Comprehension: 4-Understands basic 75 - 89% of the time/requires cueing 10 - 24% of the time Expression Expression Mode: Verbal Expression: 5-Expresses basic 90% of the time/requires cueing < 10% of the time. Social Interaction Social Interaction:  2-Interacts appropriately 25 - 49% of time - Needs  frequent redirection. Problem Solving Problem Solving: 2-Solves basic 25 - 49% of the time - needs direction more than half the time to initiate, plan or complete simple activities Memory Memory: 2-Recognizes or recalls 25 - 49% of the time/requires cueing 51 - 75% of the time FIM - Eating Eating Activity: 5: Supervision/cues Pain Pain Assessment Pain Assessment: 0-10 Pain Score: 8  Pain Type: Surgical pain Pain Location: Back Pain Orientation: Mid;Lower Pain Descriptors / Indicators: Aching Pain Frequency: Constant Pain Onset: On-going Pain Intervention(s): Medication (See eMAR)  Therapy/Group: Individual Therapy  Aunesty Tyson 10/20/2013, 3:42 PM

## 2013-10-20 NOTE — Progress Notes (Signed)
Patient ID: Erica [name], female   DOB: 15-Jul-1933, 80 y.o.   MRN: [id number] Patient has been having increasing back pain over the past weeks time. Turning and bending leads to a loud crack and considerable discomfort. I advised that I like to order a CT scan of the fracture site to make sure that things are healing well.
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
## 2013-10-21 ENCOUNTER — Inpatient Hospital Stay (HOSPITAL_COMMUNITY): Payer: No Typology Code available for payment source | Admitting: Speech Pathology

## 2013-10-21 ENCOUNTER — Inpatient Hospital Stay (HOSPITAL_COMMUNITY): Payer: Medicare HMO

## 2013-10-21 ENCOUNTER — Encounter (HOSPITAL_COMMUNITY): Payer: Medicare HMO

## 2013-10-21 DIAGNOSIS — S066XAA Traumatic subarachnoid hemorrhage with loss of consciousness status unknown, initial encounter: Secondary | ICD-10-CM

## 2013-10-21 DIAGNOSIS — D62 Acute posthemorrhagic anemia: Secondary | ICD-10-CM

## 2013-10-21 DIAGNOSIS — S32009A Unspecified fracture of unspecified lumbar vertebra, initial encounter for closed fracture: Secondary | ICD-10-CM

## 2013-10-21 DIAGNOSIS — K560 Paralytic ileus: Secondary | ICD-10-CM

## 2013-10-21 DIAGNOSIS — M461 Sacroiliitis, not elsewhere classified: Secondary | ICD-10-CM

## 2013-10-21 DIAGNOSIS — S066X9A Traumatic subarachnoid hemorrhage with loss of consciousness of unspecified duration, initial encounter: Secondary | ICD-10-CM

## 2013-10-21 DIAGNOSIS — Z5189 Encounter for other specified aftercare: Secondary | ICD-10-CM

## 2013-10-21 LAB — POCT I-STAT 7, (LYTES, BLD GAS, ICA,H+H)
Acid-base deficit: 2 mmol/L (ref 0.0–2.0)
Bicarbonate: 22.6 meq/L (ref 20.0–24.0)
Calcium, Ion: 1 mmol/L — ABNORMAL LOW (ref 1.13–1.30)
HCT: 37 % (ref 36.0–46.0)
HEMOGLOBIN: 12.6 g/dL (ref 12.0–15.0)
O2 SAT: 100 %
PH ART: 7.393 (ref 7.350–7.450)
POTASSIUM: 3.3 meq/L — AB (ref 3.7–5.3)
Patient temperature: 35.9
Sodium: 139 mEq/L (ref 137–147)
TCO2: 24 mmol/L (ref 0–100)
pCO2 arterial: 36.8 mmHg (ref 35.0–45.0)
pO2, Arterial: 381 mmHg — ABNORMAL HIGH (ref 80.0–100.0)

## 2013-10-21 LAB — URINE CULTURE: Colony Count: >=100000

## 2013-10-21 MED ORDER — MORPHINE SULFATE 2 MG/ML IJ SOLN
2.0000 mg | INTRAMUSCULAR | Status: DC | PRN
  Administered 2013-10-22 – 2013-10-23 (×4): 2 mg via INTRAVENOUS
  Administered 2013-10-23: 4 mg via INTRAVENOUS
  Filled 2013-10-21: qty 2
  Filled 2013-10-21 (×4): qty 1

## 2013-10-21 MED ORDER — AMOXICILLIN 500 MG PO CAPS
500.0000 mg | ORAL_CAPSULE | Freq: Three times a day (TID) | ORAL | Status: DC
  Administered 2013-10-21 – 2013-10-22 (×6): 500 mg via ORAL
  Filled 2013-10-21 (×10): qty 1

## 2013-10-21 NOTE — Progress Notes (Signed)
The skilled treatment note has been reviewed and SLP is in agreement.  Illianna Paschal, M.A., CCC-SLP  [phone]   

## 2013-10-21 NOTE — Progress Notes (Signed)
Occupational Therapy Note  Patient Details  Name: Erica [name] MRN: [id number] Date of Birth: 08/24/1933    Pt missed 60 mins skilled OT services per MD order for bedrest.  Leroy Libman 10/21/2013, 7:12 AM

## 2013-10-21 NOTE — Progress Notes (Signed)
Social Work Patient ID: Erica [name], female   DOB: 09-18-1933, 80 y.o.   MRN: [id number]  Met yesterday afternoon with pt's daughter, Elsie Amis, to review team conference.  Explained to daughter that team feels they are not making much progress with her mother and that we should begin pursuing SNF if family unable to meet care needs at home.  Daughter and I had spoken about option of SNF last week and daughter had been open to this option "..If she's just not making any progress here...".   Daughter understands likely need to move on SNF, however, still with concerns about pt's bowels and pain and states family wants to hear from Dr. Ellene Route before pt were to transfer.  Daughter aware that our MD/PA had already contacted Dr. Clarice Pole office and encouraged her to do so as well if she chooses.  Agreed that I would follow up with family today and begin looking into bed availability at family's preferred SNFs.  Noting progress notes from this morning.  Pt now on bedrest.  Will hold on any bed search.  Await instruction from MD on whether pt to transfer to acute?  Continue to follow.  Johanna Stafford, LCSW
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
## 2013-10-21 NOTE — Progress Notes (Signed)
Physical Therapy Weekly Progress Note  Patient Details  Name: Erica [name] MRN: [id number] Date of Birth: 1933-09-15  Beginning of progress report period: October 13, 2013 End of progress report period: August 26,2015   Pt has met 0/4 STGs since admission due to very minimal and inconsistent progress. Pt req max-total Ax1-2 persons overall for bed, mobility, t/f sit<>stand, SPT and supervision for w/c propulsion up to 75'. Pt req max multimodal cues for active participation in tx sessions due to internal and external distracters, specifically pain and anxiety. Overall, pt demonstrates very poor functional endurance and limited tolerance to mobility. Per discussion by pt's care team, therapy schedule adjusted from 4.5hr to 3hr/day for increased tolerance.   Patient continues to demonstrate the following deficits: decreased activity tolerance, decreased strength, decreased initiation, decreased safety awareness, decreased balance, decreased insight to deficits, decreased coordination, impaired cognition and therefore will continue to benefit from skilled PT intervention to enhance overall performance with activity tolerance, balance, postural control, ability to compensate for deficits, functional use of  right lower extremity and left lower extremity, attention, awareness, coordination and knowledge of precautions.  Patient not progressing toward long term goals. See goal revision.  Plan of care revisions: LTGs downgraded.  PT Short Term Goals Week 1:  PT Short Term Goal 1 (Week 1): Pt to perform bed mobility with mod A and without use of hosptial bed functions PT Short Term Goal 2 (Week 1): Pt to perform bed<>w/c transfers with min guard A PT Short Term Goal 2 - Progress (Week 1): Not met PT Short Term Goal 3 (Week 1): Pt to propel w/c 100' with supervision PT Short Term Goal 3 - Progress (Week 1): Not met PT Short Term Goal 4 (Week 1): Pt to ambulate 20' with RW and min guard A PT Short Term  Goal 4 - Progress (Week 1): Not met PT Short Term Goal 5 (Week 1): Pt to negotiate up/down 3 steps with single rail PT Short Term Goal 5 - Progress (Week 1): Not met Week 2:  PT Short Term Goal 1 (Week 2): Pt to perform bed mobility with mod A x1 person, 50% of time PT Short Term Goal 2 (Week 2): Pt to perform transfer sit<>stand with mod Ax1person, 50% of time PT Short Term Goal 3 (Week 2): Pt to propel w/c 100' with supervision PT Short Term Goal 4 (Week 2): Pt to ambulate 5' with RW and mod Ax1 person, 50% of time PT Short Term Goal 5 (Week 2): Pt to negotiate up/down 1 step w/ B rail and mod A x1person  Therapy Documentation Precautions:  Precautions Precautions: Fall;Back Precaution Booklet Issued: Yes (comment) Precaution Comments: Bone flap in R abdomen Required Braces or Orthoses: Spinal Brace Spinal Brace: Lumbar corset;Applied in sitting position Other Brace/Splint: Aspecn crosett don in sitting and for comfort Restrictions Weight Bearing Restrictions: No  See FIM for current functional status  Therapy/Group: Individual Therapy  [name], [name] 10/21/2013, 7:24 AM

## 2013-10-21 NOTE — Progress Notes (Signed)
SLP Cancellation Note  Patient Details Name: Erica [name] MRN: [id number] DOB: 02-03-1934   Cancelled treatment:  Patient is currently on bedrest. Upon entering room, patient was asleep and daughter reported lethargy due to not sleeping last night due to severe pain and asked that this clinician not attempt therapy session today. Continue current plan of care.    Krishika Bugge 10/21/2013, 9:12 AM

## 2013-10-22 ENCOUNTER — Encounter (HOSPITAL_COMMUNITY): Payer: Medicare HMO

## 2013-10-22 ENCOUNTER — Inpatient Hospital Stay (HOSPITAL_COMMUNITY): Payer: Medicare HMO | Admitting: *Deleted

## 2013-10-22 ENCOUNTER — Inpatient Hospital Stay (HOSPITAL_COMMUNITY): Payer: Medicare HMO | Admitting: Speech Pathology

## 2013-10-22 ENCOUNTER — Inpatient Hospital Stay (HOSPITAL_COMMUNITY): Payer: No Typology Code available for payment source | Admitting: *Deleted

## 2013-10-22 NOTE — Progress Notes (Signed)
Physical Therapy Note  Patient Details  Name: Erica [name] MRN: [id number] Date of Birth: [birth year] Today's Date: 10/22/2013    Patient missed 60 minutes of skilled physical therapy secondary to MD hold and orders for bed rest. Will follow up as able.   Lyons [name], PT, DPT 10/22/2013, 12:09 PM

## 2013-10-22 NOTE — Progress Notes (Addendum)
Patient's family wants a second opinion from orthopedist and Dr.Elsner requested checking in with couple of offices in town. Called [facility] call center. Dr. Wynelle Link on call tonight and the office is closed. Dr. Tonita Cong does back surgery and is on call tomorrow. Left message with call center for Dr. Dola Factor orthopedist on call. Dr. Ninfa Linden recommends calling office in am to talk with Dr. Louanne Skye regarding case. Left message with Dr. Laurena Bering nurse regarding need for second opinion.
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
## 2013-10-22 NOTE — Progress Notes (Signed)
Occupational Therapy Session Note  Patient Details  Name: Erica [name] MRN: [id number] Date of Birth: Oct 21, 1933     Short Term Goals: Week 2:  OT Short Term Goal 1 (Week 2): Pt will perform functiuonal transfers with mod A OT Short Term Goal 2 (Week 2): Pt will perform LB dressing with max A using AE PRN OT Short Term Goal 3 (Week 2): Pt will perform toileting tasks with max A OT Short Term Goal 4 (Week 2): Pt will adhere to precautions during LB dressing with min verbal cues.  Skilled Therapeutic Interventions/Progress Updates:    Pt missed 60 mins skilled OT services.  Per MD pt on bedrest.  Therapy Documentation Precautions:  Precautions Precautions: Fall;Back Precaution Booklet Issued: Yes (comment) Precaution Comments: Bone flap in R abdomen Required Braces or Orthoses: Spinal Brace Spinal Brace: Lumbar corset;Applied in sitting position Other Brace/Splint: Aspecn crosett don in sitting and for comfort Restrictions Weight Bearing Restrictions: No General: General OT Amount of Missed Time: 60 Minutes  See FIM for current functional status  Therapy/Group: Individual Therapy  Leroy Libman 10/22/2013, 7:51 AM

## 2013-10-22 NOTE — Progress Notes (Signed)
Patient ID: Erica [name], female   DOB: 1933-05-17, 80 y.o.   MRN: [id number] Reviewed the patient's films of her fracture dislocation at L1-L2. She has loss of fixation which evidently is contributing substantially to pain. Left-sided screw appears to have pulled away from the transverse process and the lateral aspect of the vertebral body. This will need to be revised.  I discussed the situation with the patient's daughter and her son by phone the patient herself is quite taken aback she states she would like to get another surgical opinion. Should also like to be treated holistically. A indicated that this appears to be and is an unstable fracture and the fact that it is moving despite the hardware suggest that it may not heal at all if movement continues. I believe that revision would require fixation 2 levels above and 2 levels below and are 2 adequately stabilize this lesion. We'll plan on adding the surgery for tomorrow afternoon. In the meantime I did discuss with the PA from rehabilitation obtaining an orthopedic consult regarding this fracture with either Dr. Rolena Infante Dr. Louanne Skye or Dr. Lynann Bologna

## 2013-10-22 NOTE — Progress Notes (Signed)
SLP Cancellation Note  Patient Details Name: Erica [name] MRN: [id number] DOB: Sep 22, 1933   Cancelled treatment:       Patient missed 60 minutes of skilled SLP intervention due to patient being on bedrest with inability to be raised past 30 degrees per MD order. Upon entering the room, the pt was awake while supine in bed and reported her pain "was on the verge of being tolerable" but has "excruciating" pain anytime she is moved or repositioned, therefore, this SLP was unable to facilitate any functional tasks at this time. Continue with current plan of care.                                                                                                Brylen Wagar 10/22/2013, 11:47 AM

## 2013-10-23 ENCOUNTER — Inpatient Hospital Stay (HOSPITAL_COMMUNITY): Payer: No Typology Code available for payment source

## 2013-10-23 ENCOUNTER — Encounter (HOSPITAL_COMMUNITY)
Admission: RE | Disposition: A | Discharge status: Other Acute Inpatient Hospital | Payer: Self-pay | Source: Intra-hospital | Attending: Physical Medicine & Rehabilitation

## 2013-10-23 ENCOUNTER — Inpatient Hospital Stay (HOSPITAL_COMMUNITY): Payer: No Typology Code available for payment source | Admitting: Certified Registered Nurse Anesthetist

## 2013-10-23 ENCOUNTER — Inpatient Hospital Stay (HOSPITAL_COMMUNITY)
Admission: AD | Admit: 2013-10-23 | Discharge: 2013-10-31 | DRG: 459 | Disposition: A | Discharge status: Skilled Nursing Facility | Payer: No Typology Code available for payment source | Source: Ambulatory Visit | Attending: Neurological Surgery | Admitting: Neurological Surgery

## 2013-10-23 ENCOUNTER — Encounter (HOSPITAL_COMMUNITY)
Admission: AD | Disposition: A | Discharge status: Skilled Nursing Facility | Payer: Self-pay | Source: Ambulatory Visit | Attending: Neurological Surgery

## 2013-10-23 ENCOUNTER — Encounter (HOSPITAL_COMMUNITY): Payer: No Typology Code available for payment source | Admitting: Certified Registered Nurse Anesthetist

## 2013-10-23 DIAGNOSIS — S32009A Unspecified fracture of unspecified lumbar vertebra, initial encounter for closed fracture: Secondary | ICD-10-CM | POA: Diagnosis present

## 2013-10-23 DIAGNOSIS — T84498A Other mechanical complication of other internal orthopedic devices, implants and grafts, initial encounter: Secondary | ICD-10-CM | POA: Diagnosis present

## 2013-10-23 DIAGNOSIS — K567 Ileus, unspecified: Secondary | ICD-10-CM

## 2013-10-23 DIAGNOSIS — S065X0A Traumatic subdural hemorrhage without loss of consciousness, initial encounter: Secondary | ICD-10-CM | POA: Diagnosis present

## 2013-10-23 DIAGNOSIS — Y838 Other surgical procedures as the cause of abnormal reaction of the patient, or of later complication, without mention of misadventure at the time of the procedure: Secondary | ICD-10-CM | POA: Diagnosis present

## 2013-10-23 DIAGNOSIS — E861 Hypovolemia: Secondary | ICD-10-CM | POA: Diagnosis present

## 2013-10-23 DIAGNOSIS — IMO0002 Reserved for concepts with insufficient information to code with codable children: Secondary | ICD-10-CM | POA: Diagnosis present

## 2013-10-23 DIAGNOSIS — M459 Ankylosing spondylitis of unspecified sites in spine: Secondary | ICD-10-CM

## 2013-10-23 DIAGNOSIS — S34129A Incomplete lesion of unspecified level of lumbar spinal cord, initial encounter: Secondary | ICD-10-CM

## 2013-10-23 DIAGNOSIS — I959 Hypotension, unspecified: Secondary | ICD-10-CM

## 2013-10-23 DIAGNOSIS — S34129D Incomplete lesion of unspecified level of lumbar spinal cord, subsequent encounter: Secondary | ICD-10-CM

## 2013-10-23 DIAGNOSIS — K9189 Other postprocedural complications and disorders of digestive system: Secondary | ICD-10-CM

## 2013-10-23 HISTORY — PX: HARDWARE REMOVAL: SHX979

## 2013-10-23 LAB — BASIC METABOLIC PANEL
Anion gap: 10 (ref 5–15)
BUN: 11 mg/dL (ref 6–23)
CALCIUM: 8.9 mg/dL (ref 8.4–10.5)
CO2: 32 mEq/L (ref 19–32)
CREATININE: 0.54 mg/dL (ref 0.50–1.10)
Chloride: 94 mEq/L — ABNORMAL LOW (ref 96–112)
GFR calc non Af Amer: 89 mL/min — ABNORMAL LOW (ref >90)
Glucose, Bld: 104 mg/dL — ABNORMAL HIGH (ref 70–99)
POTASSIUM: 4.4 meq/L (ref 3.7–5.3)
Sodium: 136 mEq/L — ABNORMAL LOW (ref 137–147)

## 2013-10-23 LAB — CBC
HCT: 36.7 % (ref 36.0–46.0)
Hemoglobin: 11.9 g/dL — ABNORMAL LOW (ref 12.0–15.0)
MCH: 29 pg (ref 26.0–34.0)
MCHC: 32.4 g/dL (ref 30.0–36.0)
MCV: 89.3 fL (ref 78.0–100.0)
PLATELETS: 466 10*3/uL — AB (ref 150–400)
RBC: 4.11 MIL/uL (ref 3.87–5.11)
RDW: 15.1 % (ref 11.5–15.5)
WBC: 9.7 10*3/uL (ref 4.0–10.5)

## 2013-10-23 LAB — PROTIME-INR
INR: 1.1 (ref 0.00–1.49)
PROTHROMBIN TIME: 14.2 s (ref 11.6–15.2)

## 2013-10-23 LAB — PREPARE RBC (CROSSMATCH)

## 2013-10-23 LAB — APTT: aPTT: 29 seconds (ref 24–37)

## 2013-10-23 SURGERY — REMOVAL, HARDWARE
Anesthesia: General

## 2013-10-23 MED ORDER — ROCURONIUM BROMIDE 100 MG/10ML IV SOLN
INTRAVENOUS | Status: DC | PRN
  Administered 2013-10-23: 30 mg via INTRAVENOUS
  Administered 2013-10-23: 20 mg via INTRAVENOUS

## 2013-10-23 MED ORDER — DEXAMETHASONE SODIUM PHOSPHATE 4 MG/ML IJ SOLN
INTRAMUSCULAR | Status: AC
  Filled 2013-10-23: qty 1

## 2013-10-23 MED ORDER — LIDOCAINE 5 % EX PTCH: 2.0000 | MEDICATED_PATCH | CUTANEOUS | Status: DC

## 2013-10-23 MED ORDER — ONDANSETRON HCL 4 MG/2ML IJ SOLN
INTRAMUSCULAR | Status: DC | PRN
  Administered 2013-10-23: 4 mg via INTRAVENOUS

## 2013-10-23 MED ORDER — OXYCODONE HCL 5 MG PO TABS: 10.0000 mg | ORAL_TABLET | ORAL | Status: DC | PRN

## 2013-10-23 MED ORDER — SODIUM CHLORIDE 0.9 % IJ SOLN
INTRAMUSCULAR | Status: AC
  Filled 2013-10-23: qty 10

## 2013-10-23 MED ORDER — TAMSULOSIN HCL 0.4 MG PO CAPS: 0.4000 mg | ORAL_CAPSULE | Freq: Every day | ORAL | Status: DC

## 2013-10-23 MED ORDER — SODIUM CHLORIDE 0.9 % IV SOLN
INTRAVENOUS | Status: DC | PRN
  Administered 2013-10-23: 22:00:00 via INTRAVENOUS

## 2013-10-23 MED ORDER — BISACODYL 10 MG RE SUPP: 10.0000 mg | Freq: Every day | RECTAL | Status: DC | PRN

## 2013-10-23 MED ORDER — SENNOSIDES-DOCUSATE SODIUM 8.6-50 MG PO TABS: 2.0000 | ORAL_TABLET | Freq: Three times a day (TID) | ORAL | Status: DC

## 2013-10-23 MED ORDER — METHOCARBAMOL 500 MG PO TABS: 500.0000 mg | ORAL_TABLET | Freq: Four times a day (QID) | ORAL | Status: DC

## 2013-10-23 MED ORDER — LIDOCAINE HCL (CARDIAC) 20 MG/ML IV SOLN
INTRAVENOUS | Status: AC
  Filled 2013-10-23: qty 5

## 2013-10-23 MED ORDER — PROMETHAZINE HCL 25 MG/ML IJ SOLN: 6.2500 mg | INTRAMUSCULAR | Status: DC | PRN

## 2013-10-23 MED ORDER — PROPOFOL 10 MG/ML IV BOLUS
INTRAVENOUS | Status: DC | PRN
  Administered 2013-10-23: 130 mg via INTRAVENOUS

## 2013-10-23 MED ORDER — FENTANYL CITRATE 0.05 MG/ML IJ SOLN
INTRAMUSCULAR | Status: DC | PRN
  Administered 2013-10-23: 50 ug via INTRAVENOUS
  Administered 2013-10-23 (×2): 25 ug via INTRAVENOUS
  Administered 2013-10-23: 50 ug via INTRAVENOUS
  Administered 2013-10-23: 100 ug via INTRAVENOUS

## 2013-10-23 MED ORDER — POTASSIUM CHLORIDE IN NACL 20-0.45 MEQ/L-% IV SOLN
INTRAVENOUS | Status: DC
Start: 2013-10-23 — End: 2013-10-23
  Administered 2013-10-23: 09:00:00 via INTRAVENOUS
  Filled 2013-10-23 (×2): qty 1000

## 2013-10-23 MED ORDER — GLYCOPYRROLATE 0.2 MG/ML IJ SOLN
INTRAMUSCULAR | Status: AC
  Filled 2013-10-23: qty 2

## 2013-10-23 MED ORDER — GABAPENTIN 100 MG PO CAPS: 100.0000 mg | ORAL_CAPSULE | Freq: Three times a day (TID) | ORAL | Status: DC

## 2013-10-23 MED ORDER — SUFENTANIL CITRATE 50 MCG/ML IV SOLN
INTRAVENOUS | Status: AC
  Filled 2013-10-23: qty 1

## 2013-10-23 MED ORDER — BUPIVACAINE HCL (PF) 0.5 % IJ SOLN
INTRAMUSCULAR | Status: DC | PRN
  Administered 2013-10-23: 5 mL

## 2013-10-23 MED ORDER — FENTANYL CITRATE 0.05 MG/ML IJ SOLN
INTRAMUSCULAR | Status: AC
Start: 2013-10-23 — End: 2013-10-23
  Filled 2013-10-23: qty 5

## 2013-10-23 MED ORDER — ROCURONIUM BROMIDE 50 MG/5ML IV SOLN
INTRAVENOUS | Status: AC
  Filled 2013-10-23: qty 1

## 2013-10-23 MED ORDER — TRAMADOL HCL 50 MG PO TABS: 50.0000 mg | ORAL_TABLET | Freq: Four times a day (QID) | ORAL | Status: DC

## 2013-10-23 MED ORDER — TRAZODONE 25 MG HALF TABLET: 25.0000 mg | ORAL_TABLET | Freq: Every evening | ORAL | Status: DC | PRN

## 2013-10-23 MED ORDER — ALPRAZOLAM 0.25 MG PO TABS: 0.2500 mg | ORAL_TABLET | Freq: Two times a day (BID) | ORAL | Status: DC

## 2013-10-23 MED ORDER — SUCCINYLCHOLINE CHLORIDE 20 MG/ML IJ SOLN
INTRAMUSCULAR | Status: DC | PRN
  Administered 2013-10-23: 120 mg via INTRAVENOUS

## 2013-10-23 MED ORDER — CEFAZOLIN SODIUM-DEXTROSE 2-3 GM-% IV SOLR
INTRAVENOUS | Status: DC | PRN
  Administered 2013-10-23: 2 g via INTRAVENOUS

## 2013-10-23 MED ORDER — ONDANSETRON HCL 4 MG/2ML IJ SOLN
INTRAMUSCULAR | Status: AC
  Filled 2013-10-23: qty 2

## 2013-10-23 MED ORDER — GELATIN ABSORBABLE MT POWD
OROMUCOSAL | Status: DC | PRN
  Administered 2013-10-23 (×2): via TOPICAL

## 2013-10-23 MED ORDER — METOPROLOL TARTRATE 25 MG/10 ML ORAL SUSPENSION: 12.5000 mg | Freq: Two times a day (BID) | ORAL | Status: DC

## 2013-10-23 MED ORDER — 0.9 % SODIUM CHLORIDE (POUR BTL) OPTIME
TOPICAL | Status: DC | PRN
  Administered 2013-10-23: 1000 mL

## 2013-10-23 MED ORDER — PROPOFOL 10 MG/ML IV BOLUS
INTRAVENOUS | Status: AC
  Filled 2013-10-23: qty 20

## 2013-10-23 MED ORDER — MAGNESIUM OXIDE 400 (241.3 MG) MG PO TABS: 200.0000 mg | ORAL_TABLET | Freq: Every day | ORAL | Status: DC

## 2013-10-23 MED ORDER — NEOSTIGMINE METHYLSULFATE 10 MG/10ML IV SOLN
INTRAVENOUS | Status: AC
  Filled 2013-10-23: qty 1

## 2013-10-23 MED ORDER — LACTATED RINGERS IV SOLN
INTRAVENOUS | Status: DC | PRN
  Administered 2013-10-23 (×3): via INTRAVENOUS

## 2013-10-23 MED ORDER — GLYCOPYRROLATE 0.2 MG/ML IJ SOLN
INTRAMUSCULAR | Status: DC | PRN
  Administered 2013-10-23: 0.4 mg via INTRAVENOUS

## 2013-10-23 MED ORDER — BETHANECHOL CHLORIDE 25 MG PO TABS: 25.0000 mg | ORAL_TABLET | Freq: Four times a day (QID) | ORAL | Status: DC

## 2013-10-23 MED ORDER — PHENYLEPHRINE HCL 10 MG/ML IJ SOLN
INTRAMUSCULAR | Status: DC | PRN
  Administered 2013-10-23: 80 ug via INTRAVENOUS

## 2013-10-23 MED ORDER — MEPERIDINE HCL 25 MG/ML IJ SOLN: 6.2500 mg | INTRAMUSCULAR | Status: DC | PRN

## 2013-10-23 MED ORDER — NEOSTIGMINE METHYLSULFATE 10 MG/10ML IV SOLN
INTRAVENOUS | Status: DC | PRN
  Administered 2013-10-23: 3 mg via INTRAVENOUS

## 2013-10-23 MED ORDER — FENTANYL CITRATE 0.05 MG/ML IJ SOLN: 25.0000 ug | INTRAMUSCULAR | Status: DC | PRN

## 2013-10-23 MED ORDER — LIDOCAINE HCL (CARDIAC) 20 MG/ML IV SOLN
INTRAVENOUS | Status: DC | PRN
  Administered 2013-10-23: 75 mg via INTRAVENOUS

## 2013-10-23 MED ORDER — SUFENTANIL CITRATE 50 MCG/ML IV SOLN
INTRAVENOUS | Status: DC | PRN
  Administered 2013-10-23 (×3): 10 ug via INTRAVENOUS

## 2013-10-23 MED ORDER — VANCOMYCIN HCL 500 MG IV SOLR
INTRAVENOUS | Status: DC | PRN
  Administered 2013-10-23: 1000 mg via TOPICAL

## 2013-10-23 MED ORDER — PHENYLEPHRINE 40 MCG/ML (10ML) SYRINGE FOR IV PUSH (FOR BLOOD PRESSURE SUPPORT)
PREFILLED_SYRINGE | INTRAVENOUS | Status: AC
  Filled 2013-10-23: qty 10

## 2013-10-23 MED ORDER — VANCOMYCIN HCL 1000 MG IV SOLR
INTRAVENOUS | Status: AC
  Filled 2013-10-23: qty 1000

## 2013-10-23 MED ORDER — DEXAMETHASONE SODIUM PHOSPHATE 4 MG/ML IJ SOLN
INTRAMUSCULAR | Status: DC | PRN
  Administered 2013-10-23: 4 mg via INTRAVENOUS

## 2013-10-23 MED ORDER — THROMBIN 20000 UNITS EX SOLR
CUTANEOUS | Status: DC | PRN
  Administered 2013-10-23: 21:00:00 via TOPICAL

## 2013-10-23 MED ORDER — SUCCINYLCHOLINE CHLORIDE 20 MG/ML IJ SOLN
INTRAMUSCULAR | Status: AC
  Filled 2013-10-23: qty 1

## 2013-10-23 MED ORDER — ALBUMIN HUMAN 5 % IV SOLN
INTRAVENOUS | Status: DC | PRN
  Administered 2013-10-23: 21:00:00 via INTRAVENOUS

## 2013-10-23 MED ORDER — AMOXICILLIN 500 MG PO CAPS: 500.0000 mg | ORAL_CAPSULE | Freq: Three times a day (TID) | ORAL | Status: DC

## 2013-10-23 SURGICAL SUPPLY — 75 items
ADH SKN CLS APL DERMABOND .7 (GAUZE/BANDAGES/DRESSINGS) ×1
APL SKNCLS STERI-STRIP NONHPOA (GAUZE/BANDAGES/DRESSINGS)
BAG DECANTER FOR FLEXI CONT (MISCELLANEOUS) ×3 IMPLANT
BENZOIN TINCTURE PRP APPL 2/3 (GAUZE/BANDAGES/DRESSINGS) IMPLANT
BLADE SURG ROTATE 9660 (MISCELLANEOUS) IMPLANT
CANISTER SUCT 3000ML (MISCELLANEOUS) ×3 IMPLANT
CAP REVERE LOCKING (Cap) ×16 IMPLANT
CLOSURE WOUND 1/2 X4 (GAUZE/BANDAGES/DRESSINGS)
CONN CROSSLINK REV 38-50MM (Connector) ×3 IMPLANT
CONNECTOR CRSLINK REV 38-50MM (Connector) IMPLANT
CONT SPEC 4OZ CLIKSEAL STRL BL (MISCELLANEOUS) ×5 IMPLANT
CORDS BIPOLAR (ELECTRODE) ×3 IMPLANT
DECANTER SPIKE VIAL GLASS SM (MISCELLANEOUS) ×1 IMPLANT
DERMABOND ADVANCED (GAUZE/BANDAGES/DRESSINGS) ×2
DERMABOND ADVANCED .7 DNX12 (GAUZE/BANDAGES/DRESSINGS) IMPLANT
DRAPE LAPAROTOMY 100X72 PEDS (DRAPES) IMPLANT
DRAPE LAPAROTOMY 100X72X124 (DRAPES) IMPLANT
DRAPE POUCH INSTRU U-SHP 10X18 (DRAPES) ×3 IMPLANT
DRSG OPSITE POSTOP 4X10 (GAUZE/BANDAGES/DRESSINGS) ×2 IMPLANT
DRSG OPSITE POSTOP 4X8 (GAUZE/BANDAGES/DRESSINGS) ×2 IMPLANT
DRSG TELFA 3X8 NADH (GAUZE/BANDAGES/DRESSINGS) ×3 IMPLANT
DURAPREP 26ML APPLICATOR (WOUND CARE) ×3 IMPLANT
ELECT REM PT RETURN 9FT ADLT (ELECTROSURGICAL) ×3
ELECTRODE REM PT RTRN 9FT ADLT (ELECTROSURGICAL) ×1 IMPLANT
EVACUATOR 3/16  PVC DRAIN (DRAIN) ×2
EVACUATOR 3/16 PVC DRAIN (DRAIN) IMPLANT
GAUZE SPONGE 4X4 12PLY STRL (GAUZE/BANDAGES/DRESSINGS) ×3 IMPLANT
GAUZE SPONGE 4X4 16PLY XRAY LF (GAUZE/BANDAGES/DRESSINGS) ×2 IMPLANT
GLOVE BIOGEL PI IND STRL 7.5 (GLOVE) IMPLANT
GLOVE BIOGEL PI IND STRL 8 (GLOVE) IMPLANT
GLOVE BIOGEL PI IND STRL 8.5 (GLOVE) ×1 IMPLANT
GLOVE BIOGEL PI INDICATOR 7.5 (GLOVE) ×2
GLOVE BIOGEL PI INDICATOR 8 (GLOVE) ×2
GLOVE BIOGEL PI INDICATOR 8.5 (GLOVE) ×2
GLOVE ECLIPSE 7.5 STRL STRAW (GLOVE) ×4 IMPLANT
GLOVE ECLIPSE 8.5 STRL (GLOVE) ×3 IMPLANT
GLOVE EXAM NITRILE LRG STRL (GLOVE) IMPLANT
GLOVE EXAM NITRILE MD LF STRL (GLOVE) IMPLANT
GLOVE EXAM NITRILE XL STR (GLOVE) IMPLANT
GLOVE EXAM NITRILE XS STR PU (GLOVE) IMPLANT
GLOVE SURG SS PI 7.0 STRL IVOR (GLOVE) ×4 IMPLANT
GOWN STRL REUS W/ TWL LRG LVL3 (GOWN DISPOSABLE) ×1 IMPLANT
GOWN STRL REUS W/ TWL XL LVL3 (GOWN DISPOSABLE) ×1 IMPLANT
GOWN STRL REUS W/TWL 2XL LVL3 (GOWN DISPOSABLE) ×5 IMPLANT
GOWN STRL REUS W/TWL LRG LVL3 (GOWN DISPOSABLE) ×3
GOWN STRL REUS W/TWL XL LVL3 (GOWN DISPOSABLE) ×3
HEMOSTAT POWDER SURGIFOAM 1G (HEMOSTASIS) ×4 IMPLANT
KIT BASIN OR (CUSTOM PROCEDURE TRAY) ×3 IMPLANT
KIT ROOM TURNOVER OR (KITS) ×3 IMPLANT
MARKER SKIN DUAL TIP RULER LAB (MISCELLANEOUS) ×3 IMPLANT
NEEDLE HYPO 22GX1.5 SAFETY (NEEDLE) ×3 IMPLANT
NS IRRIG 1000ML POUR BTL (IV SOLUTION) ×3 IMPLANT
PACK LAMINECTOMY NEURO (CUSTOM PROCEDURE TRAY) ×3 IMPLANT
PAD ARMBOARD 7.5X6 YLW CONV (MISCELLANEOUS) ×3 IMPLANT
PAD DRESSING TELFA 3X8 NADH (GAUZE/BANDAGES/DRESSINGS) ×1 IMPLANT
ROD SPINE STR HEX 6.35X150 (Rod) ×4 IMPLANT
SCREW REVERE 6.35 6.5X40MM (Screw) ×12 IMPLANT
SEALER BIPOLAR AQUA 6.0 (INSTRUMENTS) ×2 IMPLANT
SPONGE LAP 4X18 X RAY DECT (DISPOSABLE) IMPLANT
SPONGE SURGIFOAM ABS GEL 100 (HEMOSTASIS) ×2 IMPLANT
SPONGE SURGIFOAM ABS GEL SZ50 (HEMOSTASIS) ×1 IMPLANT
STAPLER SKIN PROX WIDE 3.9 (STAPLE) ×2 IMPLANT
STRIP CLOSURE SKIN 1/2X4 (GAUZE/BANDAGES/DRESSINGS) IMPLANT
SUT ETHILON 3 0 FSL (SUTURE) ×4 IMPLANT
SUT VIC AB 0 CT1 18XCR BRD8 (SUTURE) ×1 IMPLANT
SUT VIC AB 0 CT1 8-18 (SUTURE) ×6
SUT VIC AB 2-0 CP2 18 (SUTURE) ×5 IMPLANT
SUT VIC AB 3-0 SH 8-18 (SUTURE) ×3 IMPLANT
SWAB CULTURE LIQ STUART DBL (MISCELLANEOUS) IMPLANT
SYR 20ML ECCENTRIC (SYRINGE) ×3 IMPLANT
SYR CONTROL 10ML LL (SYRINGE) ×3 IMPLANT
TOWEL OR 17X24 6PK STRL BLUE (TOWEL DISPOSABLE) ×3 IMPLANT
TOWEL OR 17X26 10 PK STRL BLUE (TOWEL DISPOSABLE) ×3 IMPLANT
TUBE ANAEROBIC SPECIMEN COL (MISCELLANEOUS) IMPLANT
WATER STERILE IRR 1000ML POUR (IV SOLUTION) ×3 IMPLANT

## 2013-10-23 SURGICAL SUPPLY — 53 items
ADH SKN CLS APL DERMABOND .7 (GAUZE/BANDAGES/DRESSINGS) ×1
BAG DECANTER FOR FLEXI CONT (MISCELLANEOUS) ×4 IMPLANT
BLADE SURG ROTATE 9660 (MISCELLANEOUS) IMPLANT
BUR MATCHSTICK NEURO 3.0 LAGG (BURR) ×4 IMPLANT
CANISTER SUCT 3000ML (MISCELLANEOUS) ×4 IMPLANT
CONT SPEC 4OZ CLIKSEAL STRL BL (MISCELLANEOUS) ×8 IMPLANT
COVER BACK TABLE 24X17X13 BIG (DRAPES) IMPLANT
COVER TABLE BACK 60X90 (DRAPES) ×4 IMPLANT
DECANTER SPIKE VIAL GLASS SM (MISCELLANEOUS) ×4 IMPLANT
DERMABOND ADVANCED (GAUZE/BANDAGES/DRESSINGS) ×2
DERMABOND ADVANCED .7 DNX12 (GAUZE/BANDAGES/DRESSINGS) ×2 IMPLANT
DRAPE C-ARM 42X72 X-RAY (DRAPES) ×6 IMPLANT
DRAPE LAPAROTOMY 100X72X124 (DRAPES) ×4 IMPLANT
DRAPE POUCH INSTRU U-SHP 10X18 (DRAPES) ×3 IMPLANT
DRAPE PROXIMA HALF (DRAPES) IMPLANT
DURAPREP 26ML APPLICATOR (WOUND CARE) ×4 IMPLANT
ELECT REM PT RETURN 9FT ADLT (ELECTROSURGICAL) ×3
ELECTRODE REM PT RTRN 9FT ADLT (ELECTROSURGICAL) ×2 IMPLANT
GAUZE SPONGE 4X4 12PLY STRL (GAUZE/BANDAGES/DRESSINGS) ×4 IMPLANT
GAUZE SPONGE 4X4 16PLY XRAY LF (GAUZE/BANDAGES/DRESSINGS) IMPLANT
GLOVE BIOGEL PI IND STRL 8.5 (GLOVE) ×4 IMPLANT
GLOVE BIOGEL PI INDICATOR 8.5 (GLOVE) ×4
GLOVE ECLIPSE 8.5 STRL (GLOVE) ×6 IMPLANT
GLOVE EXAM NITRILE LRG STRL (GLOVE) IMPLANT
GLOVE EXAM NITRILE MD LF STRL (GLOVE) IMPLANT
GLOVE EXAM NITRILE XL STR (GLOVE) IMPLANT
GLOVE EXAM NITRILE XS STR PU (GLOVE) IMPLANT
GOWN STRL REUS W/ TWL LRG LVL3 (GOWN DISPOSABLE) IMPLANT
GOWN STRL REUS W/ TWL XL LVL3 (GOWN DISPOSABLE) IMPLANT
GOWN STRL REUS W/TWL 2XL LVL3 (GOWN DISPOSABLE) ×8 IMPLANT
GOWN STRL REUS W/TWL LRG LVL3 (GOWN DISPOSABLE)
GOWN STRL REUS W/TWL XL LVL3 (GOWN DISPOSABLE)
HEMOSTAT POWDER KIT SURGIFOAM (HEMOSTASIS) IMPLANT
KIT BASIN OR (CUSTOM PROCEDURE TRAY) ×4 IMPLANT
KIT ROOM TURNOVER OR (KITS) ×3 IMPLANT
NEEDLE HYPO 22GX1.5 SAFETY (NEEDLE) ×4 IMPLANT
NS IRRIG 1000ML POUR BTL (IV SOLUTION) ×4 IMPLANT
PACK LAMINECTOMY NEURO (CUSTOM PROCEDURE TRAY) ×4 IMPLANT
PAD ARMBOARD 7.5X6 YLW CONV (MISCELLANEOUS) ×12 IMPLANT
PATTIES SURGICAL .5 X1 (DISPOSABLE) ×4 IMPLANT
SPONGE LAP 4X18 X RAY DECT (DISPOSABLE) IMPLANT
SPONGE SURGIFOAM ABS GEL 100 (HEMOSTASIS) ×4 IMPLANT
SUT VIC AB 1 CT1 18XBRD ANBCTR (SUTURE) ×2 IMPLANT
SUT VIC AB 1 CT1 8-18 (SUTURE) ×3
SUT VIC AB 2-0 CP2 18 (SUTURE) ×4 IMPLANT
SUT VIC AB 3-0 SH 8-18 (SUTURE) ×4 IMPLANT
SYR 20ML ECCENTRIC (SYRINGE) ×4 IMPLANT
SYR 3ML LL SCALE MARK (SYRINGE) ×16 IMPLANT
TOWEL OR 17X24 6PK STRL BLUE (TOWEL DISPOSABLE) ×4 IMPLANT
TOWEL OR 17X26 10 PK STRL BLUE (TOWEL DISPOSABLE) ×4 IMPLANT
TRAP SPECIMEN MUCOUS 40CC (MISCELLANEOUS) ×4 IMPLANT
TRAY FOLEY CATH 14FRSI W/METER (CATHETERS) ×4 IMPLANT
WATER STERILE IRR 1000ML POUR (IV SOLUTION) ×4 IMPLANT
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
## 2013-10-23 NOTE — Anesthesia Preprocedure Evaluation (Addendum)
Anesthesia Evaluation  Patient identified by MRN, date of birth, ID band Patient awake    Reviewed: Allergy & Precautions, H&P , NPO status , Patient's Chart, lab work & pertinent test results  History of Anesthesia Complications Negative for: history of anesthetic complications  Airway Mallampati: II TM Distance: >3 FB Neck ROM: Full   Comment: HX ankylosing spondylitis, was intubated on 10/06/2013 7.5 tube. Dental  (+) Teeth Intact, Dental Advisory Given   Pulmonary neg pulmonary ROS,  breath sounds clear to auscultation  Pulmonary exam normal       Cardiovascular negative cardio ROS  Rhythm:Regular Rate:Normal     Neuro/Psych negative psych ROS   GI/Hepatic negative GI ROS, Neg liver ROS,   Endo/Other  negative endocrine ROS  Renal/GU      Musculoskeletal   Abdominal   Peds  Hematology negative hematology ROS (+) H/H 11/36   Anesthesia Other Findings Was intubated 10/06/2013 with glide scope and 7.5 ET.  Has HX of ankylosing spondylitis. Flap from skull is implanted in abdomen.  Reproductive/Obstetrics                         Anesthesia Physical Anesthesia Plan  ASA: III  Anesthesia Plan: General   Post-op Pain Management:    Induction:   Airway Management Planned: Oral ETT  Additional Equipment:   Intra-op Plan:   Post-operative Plan: Extubation in OR  Informed Consent: I have reviewed the patients History and Physical, chart, labs and discussed the procedure including the risks, benefits and alternatives for the proposed anesthesia with the patient or authorized representative who has indicated his/her understanding and acceptance.   Dental advisory given  Plan Discussed with: CRNA, Anesthesiologist and Surgeon  Anesthesia Plan Comments:        Anesthesia Quick Evaluation

## 2013-10-23 NOTE — Progress Notes (Signed)
Pt is discharged to OR. Family is with the patient.

## 2013-10-23 NOTE — Progress Notes (Signed)
Patient ID: Erica [name], female   DOB: [birth year], 80 y.o.   MRN: [id number] For surgery this pm. Neuro without changes.

## 2013-10-23 NOTE — Op Note (Signed)
Date of surgery: 10/23/2013 Preoperative diagnosis: Loss of fixation of L1 and L2 fracture dislocation Postoperative diagnosis: Loss of fixation of L1 and L2 fracture dislocation Procedure: Revision of fixation from T11-L4 with segmental fixation using pedicle screws posterior instrumentation. Surgeon: Kristeen Miss Anesthesia: Gen. endotracheal Indications: Erica [name] is a 80 year old individual who had a subdural hematoma and a fracture dislocation at L1-L2. She has underlying ankylosing spondylitis. She is having increasing pain in followup CT scan demonstrated the fact that she lost fixation. She is at eyes regarding the need for further surgery to stabilize her spine as she is having markedly increased pain in great deal of instability.  Procedure: Patient was brought to the operating room supine on a stretcher. After the smooth induction of general endotracheal anesthesia, she was turned prone. The back was prepped with alcohol and DuraPrep. He was draped sterilely. The previous incision was reopened. Dissection was taken down to the lumbar dorsal fascia to expose the old hardware. A self-retaining retractor was used. The hardware was then loosened and removed and was noted that the screws on both the right side in the left side are markedly loose within the bone. Is decided that neither of these vertebral fixation points could be used again. His then decided to explore 2 levels above to obtain additional fixation points and 2 levels below. The vertebrae at T11 and T12 are fused together already from the ankylosing spondylitis as were the vertebrae at L3-L4 in fact the vertebrae at T12-L1 are fused together as was L2-L3 and L4. Then using fluoroscopic guidance after adequately preparing the entry sites I placed probes into the pedicles with a specific lateral to medial trajectory so as to get good purchase in the vertebral body. 6.5 x 40 mm screws were placed in T11 and T12 bilaterally each individually  using a probe to probe into the vertebral body making sure that there was no cut out and then tapping each individual hole. All screws had good purchase into the vertebral body. Then a straight 150 mm rod was used to connect the screw heads together. This provided a minimal amount of reduction of what appeared to be a widely angulated lordotic curve across the fracture segment. Movement of the fracture fragments however was not very much possible with the patient's direct way to on the operating table. Nonetheless were able to establish good fixation with a good lateral to medial trajectory. 2 add additional points of fixation a transverse connector was included. Final radiographs were obtained. Blood loss for this portion of procedure was estimated at thousand cc. She received 2 units of packed cells and 2 units of fresh frozen plasma during this procedure. She recently notable during the procedure. The patient had the wound closed with #1 Vicryl in the lumbar dorsal fascia 2-0 Vicryl subcutaneous layers 3-0 Vicryl subcuticularly and surgical staples in the skin. The wound was closed over 2 large Hemovac drains that were connected to a singular suction.
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
## 2013-10-23 NOTE — Anesthesia Procedure Notes (Signed)
Procedure Name: Intubation Date/Time: 10/23/2013 8:34 PM Performed by: Claris Che Pre-anesthesia Checklist: Patient identified, Emergency Drugs available, Suction available and Patient being monitored Patient Re-evaluated:Patient Re-evaluated prior to inductionOxygen Delivery Method: Circle system utilized Preoxygenation: Pre-oxygenation with 100% oxygen Intubation Type: IV induction, Rapid sequence and Cricoid Pressure applied Ventilation: Mask ventilation without difficulty Laryngoscope size: Glidescope #[id number]. Grade View: Grade II Tube type: Oral Tube size: 7.5 mm Number of attempts: 1 Airway Equipment and Method: Stylet and Video-laryngoscopy Placement Confirmation: ETT inserted through vocal cords under direct vision,  positive ETCO2 and breath sounds checked- equal and bilateral Secured at: 22 cm Tube secured with: Tape Dental Injury: Teeth and Oropharynx as per pre-operative assessment  Difficulty Due To: Difficulty was anticipated, Difficult Airway- due to reduced neck mobility and Difficult Airway-  due to neck instability

## 2013-10-23 NOTE — Transfer of Care (Signed)
Immediate Anesthesia Transfer of Care Note  Patient: Erica [name]  Procedure(s) Performed: Procedure(s) with comments: HARDWARE REMOVAL (N/A) - HARDWARE REMOVAL  Patient Location: ICU  Anesthesia Type:General  Level of Consciousness: sedated, patient cooperative and responds to stimulation  Airway & Oxygen Therapy: Patient Spontanous Breathing and Patient connected to nasal cannula oxygen  Post-op Assessment: Report given to PACU RN, Post -op Vital signs reviewed and stable and Patient moving all extremities X 4  Post vital signs: Reviewed and stable  Complications: No apparent anesthesia complications

## 2013-10-24 ENCOUNTER — Encounter (HOSPITAL_COMMUNITY): Payer: Self-pay | Admitting: *Deleted

## 2013-10-24 DIAGNOSIS — S32009A Unspecified fracture of unspecified lumbar vertebra, initial encounter for closed fracture: Secondary | ICD-10-CM | POA: Diagnosis present

## 2013-10-24 DIAGNOSIS — S34129A Incomplete lesion of unspecified level of lumbar spinal cord, initial encounter: Secondary | ICD-10-CM

## 2013-10-24 DIAGNOSIS — I959 Hypotension, unspecified: Secondary | ICD-10-CM

## 2013-10-24 LAB — TYPE AND SCREEN
ABO/RH(D): AB NEG
ANTIBODY SCREEN: NEGATIVE
UNIT DIVISION: 0
UNIT DIVISION: 0

## 2013-10-24 LAB — PREPARE FRESH FROZEN PLASMA
Unit division: 0
Unit division: 0

## 2013-10-24 LAB — CBC
HEMATOCRIT: 34.8 % — AB (ref 36.0–46.0)
HEMOGLOBIN: 11.7 g/dL — AB (ref 12.0–15.0)
MCH: 29.3 pg (ref 26.0–34.0)
MCHC: 33.6 g/dL (ref 30.0–36.0)
MCV: 87.2 fL (ref 78.0–100.0)
Platelets: 352 10*3/uL (ref 150–400)
RBC: 3.99 MIL/uL (ref 3.87–5.11)
RDW: 14.6 % (ref 11.5–15.5)
WBC: 12.4 10*3/uL — ABNORMAL HIGH (ref 4.0–10.5)

## 2013-10-24 LAB — BASIC METABOLIC PANEL
Anion gap: 12 (ref 5–15)
BUN: 11 mg/dL (ref 6–23)
CHLORIDE: 98 meq/L (ref 96–112)
CO2: 26 mEq/L (ref 19–32)
Calcium: 8.1 mg/dL — ABNORMAL LOW (ref 8.4–10.5)
Creatinine, Ser: 0.48 mg/dL — ABNORMAL LOW (ref 0.50–1.10)
GFR calc Af Amer: >90 mL/min (ref >90)
GLUCOSE: 182 mg/dL — AB (ref 70–99)
POTASSIUM: 4 meq/L (ref 3.7–5.3)
Sodium: 136 mEq/L — ABNORMAL LOW (ref 137–147)

## 2013-10-24 LAB — PROTIME-INR
INR: 1.14 (ref 0.00–1.49)
Prothrombin Time: 14.6 seconds (ref 11.6–15.2)

## 2013-10-24 LAB — MRSA PCR SCREENING: MRSA by PCR: NEGATIVE

## 2013-10-24 MED ORDER — HYDROMORPHONE HCL PF 1 MG/ML IJ SOLN
0.5000 mg | INTRAMUSCULAR | Status: DC | PRN
  Administered 2013-10-24: 0.5 mg via INTRAVENOUS
  Administered 2013-10-24 – 2013-10-27 (×10): 1 mg via INTRAVENOUS
  Filled 2013-10-24 (×12): qty 1

## 2013-10-24 MED ORDER — PHENOL 1.4 % MT LIQD: 1.0000 | OROMUCOSAL | Status: DC | PRN

## 2013-10-24 MED ORDER — SODIUM CHLORIDE 0.9 % IJ SOLN
3.0000 mL | INTRAMUSCULAR | Status: DC | PRN
  Administered 2013-10-26: 3 mL via INTRAVENOUS

## 2013-10-24 MED ORDER — SENNA 8.6 MG PO TABS: 1.0000 | ORAL_TABLET | Freq: Two times a day (BID) | ORAL | Status: DC

## 2013-10-24 MED ORDER — ONDANSETRON HCL 4 MG/2ML IJ SOLN
4.0000 mg | INTRAMUSCULAR | Status: DC | PRN
  Filled 2013-10-24: qty 2

## 2013-10-24 MED ORDER — SENNOSIDES-DOCUSATE SODIUM 8.6-50 MG PO TABS
2.0000 | ORAL_TABLET | Freq: Three times a day (TID) | ORAL | Status: DC
  Administered 2013-10-24 – 2013-10-31 (×24): 2 via ORAL
  Filled 2013-10-24: qty 1
  Filled 2013-10-24 (×16): qty 2
  Filled 2013-10-24: qty 1
  Filled 2013-10-24 (×7): qty 2
  Filled 2013-10-24: qty 1
  Filled 2013-10-24: qty 2

## 2013-10-24 MED ORDER — MAGNESIUM OXIDE 400 (241.3 MG) MG PO TABS
200.0000 mg | ORAL_TABLET | Freq: Every day | ORAL | Status: DC
  Administered 2013-10-24 – 2013-10-31 (×6): 200 mg via ORAL
  Filled 2013-10-24 (×7): qty 0.5

## 2013-10-24 MED ORDER — BETHANECHOL CHLORIDE 25 MG PO TABS
25.0000 mg | ORAL_TABLET | Freq: Four times a day (QID) | ORAL | Status: DC
  Administered 2013-10-24 – 2013-10-31 (×30): 25 mg via ORAL
  Filled 2013-10-24 (×33): qty 1

## 2013-10-24 MED ORDER — DOCUSATE SODIUM 100 MG PO CAPS
100.0000 mg | ORAL_CAPSULE | Freq: Two times a day (BID) | ORAL | Status: DC
  Administered 2013-10-24 – 2013-10-31 (×15): 100 mg via ORAL
  Filled 2013-10-24 (×16): qty 1

## 2013-10-24 MED ORDER — BISACODYL 10 MG RE SUPP: 10.0000 mg | Freq: Every day | RECTAL | Status: DC | PRN

## 2013-10-24 MED ORDER — MEPERIDINE HCL 25 MG/ML IJ SOLN: 6.2500 mg | INTRAMUSCULAR | Status: DC | PRN

## 2013-10-24 MED ORDER — MENTHOL 3 MG MT LOZG: 1.0000 | LOZENGE | OROMUCOSAL | Status: DC | PRN

## 2013-10-24 MED ORDER — ACETAMINOPHEN 650 MG RE SUPP: 650.0000 mg | RECTAL | Status: DC | PRN

## 2013-10-24 MED ORDER — OXYCODONE HCL 5 MG PO TABS
10.0000 mg | ORAL_TABLET | ORAL | Status: DC | PRN
  Administered 2013-10-25 – 2013-10-31 (×15): 10 mg via ORAL
  Filled 2013-10-24 (×16): qty 2

## 2013-10-24 MED ORDER — VITAMIN C 500 MG PO TABS
500.0000 mg | ORAL_TABLET | Freq: Every day | ORAL | Status: DC
  Administered 2013-10-24 – 2013-10-31 (×8): 500 mg via ORAL
  Filled 2013-10-24 (×8): qty 1

## 2013-10-24 MED ORDER — CEFAZOLIN SODIUM 1-5 GM-% IV SOLN
1.0000 g | Freq: Three times a day (TID) | INTRAVENOUS | Status: AC
  Administered 2013-10-24 (×2): 1 g via INTRAVENOUS
  Filled 2013-10-24 (×2): qty 50

## 2013-10-24 MED ORDER — ALUM & MAG HYDROXIDE-SIMETH 200-200-20 MG/5ML PO SUSP: 30.0000 mL | Freq: Four times a day (QID) | ORAL | Status: DC | PRN

## 2013-10-24 MED ORDER — TRAMADOL HCL 50 MG PO TABS
50.0000 mg | ORAL_TABLET | Freq: Four times a day (QID) | ORAL | Status: DC
  Administered 2013-10-24 – 2013-10-31 (×31): 50 mg via ORAL
  Filled 2013-10-24 (×31): qty 1

## 2013-10-24 MED ORDER — POLYETHYLENE GLYCOL 3350 17 G PO PACK
17.0000 g | PACK | Freq: Every day | ORAL | Status: DC | PRN
  Filled 2013-10-24: qty 1

## 2013-10-24 MED ORDER — TAMSULOSIN HCL 0.4 MG PO CAPS
0.4000 mg | ORAL_CAPSULE | Freq: Every day | ORAL | Status: DC
  Administered 2013-10-24 – 2013-10-31 (×8): 0.4 mg via ORAL
  Filled 2013-10-24 (×9): qty 1

## 2013-10-24 MED ORDER — FENTANYL CITRATE 0.05 MG/ML IJ SOLN: 25.0000 ug | INTRAMUSCULAR | Status: DC | PRN

## 2013-10-24 MED ORDER — METOPROLOL TARTRATE 25 MG/10 ML ORAL SUSPENSION
12.5000 mg | Freq: Two times a day (BID) | ORAL | Status: DC
  Administered 2013-10-24 – 2013-10-31 (×15): 12.5 mg via ORAL
  Filled 2013-10-24 (×18): qty 5

## 2013-10-24 MED ORDER — SODIUM CHLORIDE 0.9 % IJ SOLN
3.0000 mL | Freq: Two times a day (BID) | INTRAMUSCULAR | Status: DC
  Administered 2013-10-24 – 2013-10-31 (×11): 3 mL via INTRAVENOUS

## 2013-10-24 MED ORDER — TRAZODONE 25 MG HALF TABLET
25.0000 mg | ORAL_TABLET | Freq: Every evening | ORAL | Status: DC | PRN
  Administered 2013-10-26: 50 mg via ORAL
  Filled 2013-10-24 (×3): qty 2

## 2013-10-24 MED ORDER — DIPHENHYDRAMINE HCL 50 MG/ML IJ SOLN
25.0000 mg | Freq: Three times a day (TID) | INTRAMUSCULAR | Status: DC | PRN
Start: 2013-10-24 — End: 2013-10-31
  Administered 2013-10-24 – 2013-10-29 (×5): 25 mg via INTRAVENOUS
  Filled 2013-10-24 (×5): qty 1

## 2013-10-24 MED ORDER — OXYCODONE-ACETAMINOPHEN 5-325 MG PO TABS
1.0000 | ORAL_TABLET | ORAL | Status: DC | PRN
  Administered 2013-10-24: 1 via ORAL
  Administered 2013-10-25 – 2013-10-29 (×11): 2 via ORAL
  Filled 2013-10-24 (×12): qty 2

## 2013-10-24 MED ORDER — LABETALOL HCL 5 MG/ML IV SOLN
INTRAVENOUS | Status: AC
  Filled 2013-10-24: qty 4

## 2013-10-24 MED ORDER — PROMETHAZINE HCL 25 MG/ML IJ SOLN: 6.2500 mg | INTRAMUSCULAR | Status: DC | PRN

## 2013-10-24 MED ORDER — GABAPENTIN 100 MG PO CAPS
100.0000 mg | ORAL_CAPSULE | Freq: Three times a day (TID) | ORAL | Status: DC
  Administered 2013-10-24 – 2013-10-31 (×23): 100 mg via ORAL
  Filled 2013-10-24 (×25): qty 1

## 2013-10-24 MED ORDER — LABETALOL HCL 5 MG/ML IV SOLN
10.0000 mg | Freq: Once | INTRAVENOUS | Status: AC
  Administered 2013-10-24: 10 mg via INTRAVENOUS

## 2013-10-24 MED ORDER — METHOCARBAMOL 500 MG PO TABS
500.0000 mg | ORAL_TABLET | Freq: Four times a day (QID) | ORAL | Status: DC
  Administered 2013-10-24 – 2013-10-31 (×31): 500 mg via ORAL
  Filled 2013-10-24 (×36): qty 1

## 2013-10-24 MED ORDER — KETOROLAC TROMETHAMINE 15 MG/ML IJ SOLN
15.0000 mg | Freq: Four times a day (QID) | INTRAMUSCULAR | Status: AC
  Administered 2013-10-24 (×5): 15 mg via INTRAVENOUS
  Filled 2013-10-24 (×5): qty 1

## 2013-10-24 MED ORDER — ALPRAZOLAM 0.25 MG PO TABS
0.2500 mg | ORAL_TABLET | Freq: Two times a day (BID) | ORAL | Status: DC
  Administered 2013-10-24 – 2013-10-31 (×15): 0.25 mg via ORAL
  Filled 2013-10-24 (×16): qty 1

## 2013-10-24 MED ORDER — HYDROMORPHONE HCL PF 1 MG/ML IJ SOLN
INTRAMUSCULAR | Status: AC
  Filled 2013-10-24: qty 1

## 2013-10-24 MED ORDER — BISACODYL 10 MG RE SUPP
10.0000 mg | Freq: Every day | RECTAL | Status: DC | PRN
  Administered 2013-10-29 – 2013-10-30 (×2): 10 mg via RECTAL
  Filled 2013-10-24 (×2): qty 1

## 2013-10-24 MED ORDER — SODIUM CHLORIDE 0.9 % IV SOLN
250.0000 mL | INTRAVENOUS | Status: DC
  Administered 2013-10-24: 250 mL via INTRAVENOUS

## 2013-10-24 MED ORDER — SODIUM CHLORIDE 0.9 % IV SOLN
INTRAVENOUS | Status: DC
  Administered 2013-10-24: 15:00:00 via INTRAVENOUS
  Administered 2013-10-24: 75 mL/h via INTRAVENOUS
  Administered 2013-10-25 (×2): 1000 mL via INTRAVENOUS

## 2013-10-24 MED ORDER — ACETAMINOPHEN 325 MG PO TABS: 650.0000 mg | ORAL_TABLET | ORAL | Status: DC | PRN
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
## 2013-10-24 NOTE — Anesthesia Postprocedure Evaluation (Signed)
  Anesthesia Post-op Note  Patient: Erica [name]  Procedure(s) Performed: Procedure(s) with comments: HARDWARE REMOVAL (N/A) - HARDWARE REMOVAL  Patient Location: PACU and NICU  Anesthesia Type:General  Level of Consciousness: awake  Airway and Oxygen Therapy: Patient Spontanous Breathing  Post-op Pain: mild  Post-op Assessment: Post-op Vital signs reviewed  Post-op Vital Signs: Reviewed  Last Vitals:  Filed Vitals:   10/23/13 2345  BP: 196/83  Pulse: 96  Temp: 36.9 C  Resp: 16    Complications: No apparent anesthesia complications

## 2013-10-24 NOTE — Plan of Care (Signed)
Problem: Consults Goal: Diagnosis - Spinal Surgery Outcome: Completed/Met Date Met:  10/24/13 Thoraco/Lumbar Spine Fusion

## 2013-10-24 NOTE — Progress Notes (Signed)
PT Cancellation Note  Patient Details Name: Erica [name] MRN: [id number] DOB: 03-May-1933   Cancelled Treatment:    Reason Eval/Treat Not Completed: Medical issues which prohibited therapy   [name], Tessie Fass 10/24/2013, 12:45 PM

## 2013-10-25 DIAGNOSIS — K929 Disease of digestive system, unspecified: Secondary | ICD-10-CM

## 2013-10-25 DIAGNOSIS — K560 Paralytic ileus: Secondary | ICD-10-CM

## 2013-10-25 DIAGNOSIS — I959 Hypotension, unspecified: Secondary | ICD-10-CM

## 2013-10-25 DIAGNOSIS — M459 Ankylosing spondylitis of unspecified sites in spine: Secondary | ICD-10-CM

## 2013-10-25 LAB — URINE CULTURE
COLONY COUNT: NO GROWTH
Culture: NO GROWTH

## 2013-10-25 NOTE — Progress Notes (Signed)
Pt seen and examined. No issues overnight. Pt does c/o back soreness, otherwise no complaints.  EXAM: Temp:  [98.2 F (36.8 C)-99.1 F (37.3 C)] 98.2 F (36.8 C) (08/30 0804) Pulse Rate:  [83-110] 87 (08/30 0800) Resp:  [11-20] 14 (08/30 0800) BP: (100-151)/(41-70) 128/62 mmHg (08/30 0800) SpO2:  [92 %-100 %] 98 % (08/30 0800) Intake/Output     08/29 0701 - 08/30 0700 08/30 0701 - 08/31 0700   P.O. 480    I.V. (mL/kg) 1650 (29.7) 75 (1.4)   Blood     IV Piggyback 50    Total Intake(mL/kg) 2180 (39.3) 75 (1.4)   Urine (mL/kg/hr) 1505 (1.1) 275 (2.7)   Drains 330 (0.2)    Blood     Total Output 1835 275   Net +345 -200         Awake, alert, mildly confused Speech fluent CN grossly intact MAE well Hemovac in place ~350cc x 24hrs  LABS: Lab Results  Component Value Date   CREATININE 0.48* 10/24/2013   BUN 11 10/24/2013   NA 136* 10/24/2013   K 4.0 10/24/2013   CL 98 10/24/2013   CO2 26 10/24/2013   Lab Results  Component Value Date   WBC 12.4* 10/24/2013   HGB 11.7* 10/24/2013   HCT 34.8* 10/24/2013   MCV 87.2 10/24/2013   PLT 352 10/24/2013    IMPRESSION: - 80 y.o. female POD#2 s/p hardware revision, neurologically stable  PLAN: - Stable for transfer to floor today - Cont hemovac - Cont bedrest till tomorrow

## 2013-10-25 NOTE — Progress Notes (Signed)
Patient arrived via stretcher from [facility].  Patient placed in her new bed and oriented to [facility] policies and plan of care. Patient is on bed rest with a foley in place per Dr. Ellene Route.  Will monitor.  Kizzie Bane ,RN
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
## 2013-10-26 NOTE — Evaluation (Signed)
Physical Therapy Evaluation Patient Details Name: Erica [name] MRN: [id number] DOB: 1933/09/29 Today's Date: 10/26/2013   History of Present Illness  Readmitted back from CIR with loss of fixation at L1-2, s/p T11-L4 redo fixation.  Clinical Impression  Pt admitted with/for redo of L1-2 fx with T11-L4 fixation.  Pt currently limited functionally due to the problems listed. ( See problems list.)   Pt will benefit from PT to maximize function and safety in order to get ready for next venue listed below.     Follow Up Recommendations CIR    Equipment Recommendations  Rolling walker with 5" wheels;3in1 (PT)    Recommendations for Other Services Rehab consult     Precautions / Restrictions Precautions Precautions: Back;Fall Required Braces or Orthoses: Spinal Brace Spinal Brace: Thoracolumbosacral orthotic;Applied in supine position      Mobility  Bed Mobility Overal bed mobility: Needs Assistance Bed Mobility: Rolling;Sidelying to Sit Rolling: Min assist Sidelying to sit: Mod assist       General bed mobility comments: reinforced logroll and safe technique for side to sit  Transfers Overall transfer level: Needs assistance Equipment used: Rolling walker (2 wheeled) Transfers: Sit to/from Stand Sit to Stand: Min assist Stand pivot transfers: Min assist       General transfer comment: cues for hand placement; lifting and coming forward assist  Ambulation/Gait Ambulation/Gait assistance: Min assist Ambulation Distance (Feet): 60 Feet Assistive device: Rolling walker (2 wheeled) Gait Pattern/deviations: Step-through pattern;Trunk flexed;Drifts right/left (listing R) Gait velocity: decreased   General Gait Details: Mildly unsteady throughout with list R and mildly flexed posture.  Needed assist maneuvering the RW/  Stairs            Wheelchair Mobility    Modified Rankin (Stroke Patients Only)       Balance Overall balance assessment: Needs  assistance Sitting-balance support: Feet supported;No upper extremity supported Sitting balance-Leahy Scale: Fair     Standing balance support: No upper extremity supported Standing balance-Leahy Scale: Fair                               Pertinent Vitals/Pain Pain Assessment:  (really no pain only some discomfort)    Home Living Family/patient expects to be discharged to:: Unsure Living Arrangements: Alone;Other (Comment) Available Help at Discharge: Available 24 hours/day (will go to daughter home post rehab) Type of Home: House Home Access: Stairs to enter Entrance Stairs-Rails: None Entrance Stairs-Number of Steps: 2 Home Layout: Able to live on main level with bedroom/bathroom;Two level Home Equipment: Grab bars - tub/shower;None      Prior Function Level of Independence: Independent         Comments: very active in her church     Hand Dominance   Dominant Hand: Right    Extremity/Trunk Assessment   Upper Extremity Assessment: Defer to OT evaluation           Lower Extremity Assessment: Overall WFL for tasks assessed;Generalized weakness         Communication   Communication: No difficulties  Cognition Arousal/Alertness: Awake/alert Behavior During Therapy: Anxious Overall Cognitive Status: Within Functional Limits for tasks assessed       Memory: Decreased recall of precautions;Decreased short-term memory Following Commands: Follows one step commands consistently;Follows one step commands with increased time Safety/Judgement: Decreased awareness of safety;Decreased awareness of deficits     General Comments: Still axious about "what's next"    General Comments General comments (skin integrity,  edema, etc.): Reinforced all back care/precautions, log roll, lifting restrictions, bracing issues concerning TLSO and typical progression of activity.    Exercises        Assessment/Plan    PT Assessment Patient needs continued PT  services  PT Diagnosis Difficulty walking;Acute pain   PT Problem List Decreased strength;Decreased activity tolerance;Decreased mobility;Decreased knowledge of use of DME;Decreased knowledge of precautions;Cardiopulmonary status limiting activity;Pain  PT Treatment Interventions DME instruction;Gait training;Stair training;Functional mobility training;Therapeutic activities;Patient/family education   PT Goals (Current goals can be found in the Care Plan section) Acute Rehab PT Goals Patient Stated Goal: to decrease pain.  PT Goal Formulation: With patient Time For Goal Achievement: 11/04/13 Potential to Achieve Goals: Good    Frequency Min 5X/week   Barriers to discharge        Co-evaluation               End of Session Equipment Utilized During Treatment: Gait belt;Back brace Activity Tolerance: Patient tolerated treatment well;Patient limited by fatigue Patient left: in chair;with call bell/phone within reach;with family/visitor present Nurse Communication: Mobility status         Time: 1340-1430 PT Time Calculation (min): 50 min   Charges:   PT Evaluation $Initial PT Evaluation Tier I: 1 Procedure PT Treatments $Gait Training: 8-22 mins $Therapeutic Activity: 8-22 mins $Self Care/Home Management: 8-22   PT G Codes:          Dortha Neighbors, Tessie Fass 10/26/2013, 3:36 PM 10/26/2013  Donnella Sham, PT [phone]  (pager)

## 2013-10-26 NOTE — Progress Notes (Signed)
UR complete.  Raffaele Derise RN, MSN 

## 2013-10-26 NOTE — Progress Notes (Signed)
Rehab Admissions Coordinator Note:  Patient was screened by Retta Diones for appropriateness for an Inpatient Acute Rehab Consult.  Patient known to [place] from previous CIR stay.  An admissions coordinator will follow up tomorrow to determine appropriateness for possible re admit to acute inpatient rehab.  Patient has [employer], so we would also need re authorization for inpatient rehab.  Retta Diones 10/26/2013, 4:26 PM  I can be reached at [phone].

## 2013-10-26 NOTE — Progress Notes (Signed)
Subjective: Patient reports feels comfortable has been at bed rest all weekend.  Objective: Vital signs in last 24 hours: Temp:  [97.7 F (36.5 C)-98.5 F (36.9 C)] 98.1 F (36.7 C) (08/31 1035) Pulse Rate:  [78-95] 81 (08/31 1035) Resp:  [18] 18 (08/31 1035) BP: (120-164)/(41-69) 164/67 mmHg (08/31 1035) SpO2:  [96 %-98 %] 98 % (08/31 1035)  Intake/Output from previous day: 08/30 0701 - 08/31 0700 In: 225 [I.V.:225] Out: 9201 [Urine:3175; Drains:250] Intake/Output this shift:    incision dressing is dry and intact. motor function appears intct.  Lab Results:  Recent Labs  10/24/13 0230  WBC 12.4*  HGB 11.7*  HCT 34.8*  PLT 352   BMET  Recent Labs  10/24/13 0230  NA 136*  K 4.0  CL 98  CO2 26  GLUCOSE 182*  BUN 11  CREATININE 0.48*  CALCIUM 8.1*    Studies/Results: No results found.  Assessment/Plan:  stable post op  Minimal drainage from hemovacs.   LOS: 3 days  mobilize out of bed with tlso placed when lying down,   Sada Mazzoni J 10/26/2013, 1:35 PM

## 2013-10-26 NOTE — Progress Notes (Signed)
OT Cancellation Note  Patient Details Name: Erica [name] MRN: [id number] DOB: Aug 04, 1933   Cancelled Treatment:    Reason Eval/Treat Not Completed: Other (comment) (Pt still on bedrest. )  Benito Mccreedy OTR/L [phone] 10/26/2013, 1:12 PM

## 2013-10-26 NOTE — Evaluation (Signed)
Occupational Therapy Evaluation Patient Details Name: Erica [name] MRN: [id number] DOB: February 11, 1934 Today's Date: 10/26/2013    History of Present Illness Readmitted back from CIR with loss of fixation at L1-2, s/p T11-L4 redo fixation.   Clinical Impression   Pt s/p above. Feel pt will benefit from acute OT to increase independence prior to d/c. Recommending CIR for continued rehab prior to d/c home.    Follow Up Recommendations  CIR    Equipment Recommendations  3 in 1 bedside comode    Recommendations for Other Services       Precautions / Restrictions Precautions Precautions: Back;Fall Precaution Comments: Educated on precautions Required Braces or Orthoses: Spinal Brace Spinal Brace: Thoracolumbosacral orthotic;Applied in supine position Restrictions Weight Bearing Restrictions: No      Mobility Bed Mobility    General bed mobility comments: not assessed  Transfers Overall transfer level: Needs assistance Equipment used: Rolling walker (2 wheeled) Transfers: Sit to/from [facility] Sit to Stand: Min assist Stand pivot transfers: Min assist       General transfer comment: cues for technique.         ADL Overall ADL's : Needs assistance/impaired                     Lower Body Dressing: Moderate assistance;Sit to/from stand   Toilet Transfer: Minimal assistance;RW;BSC;Stand-pivot   Toileting- Water quality scientist and Hygiene: Moderate assistance;Sitting/lateral lean;Sit to/from stand       Functional mobility during ADLs: Minimal assistance;Rolling walker General ADL Comments: Educated on AE for LB ADLs and alternative ways to perform LB ADLs. Educated on toilet aid for hygiene. Educated on back brace. Educated on use of cup for teeth care and placement of grooming items to avoid breaking precautions.  Educated on 3 in 1.      Vision                     Perception     Praxis      Pertinent Vitals/Pain Pain  Assessment: No/denies pain     Hand Dominance Right   Extremity/Trunk Assessment Upper Extremity Assessment Upper Extremity Assessment: Defer to OT evaluation   Lower Extremity Assessment Lower Extremity Assessment: Defer to PT evaluation       Communication Communication Communication: No difficulties   Cognition Arousal/Alertness: Awake/alert Behavior During Therapy: WFL for tasks assessed/performed Overall Cognitive Status: Within Functional Limits for tasks assessed          General Comments       Exercises       Shoulder Instructions      Home Living Family/patient expects to be discharged to:: Unsure Living Arrangements: Alone;Other (Comment) Available Help at Discharge: Available 24 hours/day (will go to daughter home post rehab) Type of Home: House Home Access: Stairs to enter Entrance Stairs-Number of Steps: 2 Entrance Stairs-Rails: None Home Layout: Able to live on main level with bedroom/bathroom;Two level  *daughter's house has walk in shower              Home Equipment: Grab bars - tub/shower      Lives With: Alone    Prior Functioning/Environment Level of Independence: Independent        Comments: very active in her church    OT Diagnosis: Acute pain;Generalized weakness   OT Problem List: Decreased strength;Decreased range of motion;Decreased knowledge of use of DME or AE;Decreased knowledge of precautions;Decreased activity tolerance   OT Treatment/Interventions: Self-care/ADL training;DME and/or AE instruction;Therapeutic activities;Patient/family education;Balance  training    OT Goals(Current goals can be found in the care plan section) Acute Rehab OT Goals Patient Stated Goal: get her independence back OT Goal Formulation: With patient Time For Goal Achievement: 12/09/13 Potential to Achieve Goals: Good ADL Goals Pt Will Perform Grooming: with modified independence;standing Pt Will Perform Lower Body Bathing: with modified  independence;with adaptive equipment;sit to/from stand Pt Will Perform Lower Body Dressing: with modified independence;with adaptive equipment;sit to/from stand Pt Will Transfer to Toilet: with modified independence;ambulating (3 in 1 over commode) Pt Will Perform Toileting - Clothing Manipulation and hygiene: with modified independence;sit to/from stand Additional ADL Goal #1: Pt will perform bed mobility at supervision level as precursor for ADLs.  Additional ADL Goal #2: Family will be independent in donning/doffing back brace.  OT Frequency: Min 2X/week   Barriers to D/C:            Co-evaluation              End of Session Equipment Utilized During Treatment: Gait belt;Back brace;Rolling walker Nurse Communication: Other (comment) (pt on BSC)  Activity Tolerance: Patient tolerated treatment well Patient left: Other (comment) (on [facility] with daughter present)   Time: [phone] OT Time Calculation (min): 26 min Charges:  OT General Charges $OT Visit: 1 Procedure OT Evaluation $Initial OT Evaluation Tier I: 1 Procedure OT Treatments $Self Care/Home Management : 8-22 mins G-CodesBenito Mccreedy OTR/L [phone] 10/26/2013, 5:33 PM

## 2013-10-27 ENCOUNTER — Encounter (HOSPITAL_COMMUNITY): Payer: Self-pay | Admitting: Neurological Surgery

## 2013-10-27 MED ORDER — MAGNESIUM HYDROXIDE 400 MG/5ML PO SUSP: 30.0000 mL | Freq: Every day | ORAL | Status: DC | PRN

## 2013-10-27 NOTE — Progress Notes (Signed)
Rehab admissions - This patient is known to me from her previous rehab admission and I will continue to follow pt's case. I met with pt to share that acute therapy has recommended returning to inpatient rehab. Pt is interested in coming back to inpatient rehab. I then called and spoke with pt's daughter Elsie Amis who also stated that they are interested in pursuing inpatient rehab. Dtr did state, "We do not want to push things this time like we did before. We think she needs a few more days in the hospital. We want another CT scan to be sure things are in the right place."   I stated that ultimately Dr. Ellene Route will make the determination when pt will be stable for inpatient rehab. I will now open the case with [facility] to seek pre-authorization for inpatient rehab.  I will keep the pt/family and medical team aware of any updates as I wait for a response from [facility]. I contacted [employer] RN from previous admit to facilitate this process and have not yet heard back.  Please call me with any questions. Thanks.  Nanetta Batty, PT Rehabilitation Admissions Coordinator [phone]

## 2013-10-27 NOTE — Progress Notes (Signed)
Physical Therapy Treatment Patient Details Name: Erica [name] MRN: [id number] DOB: 1934/01/07 Today's Date: 10/27/2013    History of Present Illness Readmitted back from CIR with loss of fixation at L1-2, s/p T11-L4 redo fixation.    PT Comments    Progress being limited by anxiety and fears.  Limited to transfer and education today with lots of encouragement.  Follow Up Recommendations  CIR     Equipment Recommendations  Rolling walker with 5" wheels;3in1 (PT)    Recommendations for Other Services Rehab consult     Precautions / Restrictions Precautions Precautions: Back;Fall Precaution Comments: Educated on precautions Required Braces or Orthoses: Spinal Brace Spinal Brace: Thoracolumbosacral orthotic;Applied in supine position    Mobility  Bed Mobility Overal bed mobility: Needs Assistance Bed Mobility: Rolling;Sidelying to Sit Rolling: Mod assist Sidelying to sit: Mod assist;+2 for safety/equipment       General bed mobility comments: reinforced technique and assisted to roll and up to sit including guidance cuing  Transfers Overall transfer level: Needs assistance Equipment used: Rolling walker (2 wheeled) Transfers: Sit to/from [facility] Sit to Stand: Mod assist Stand pivot transfers: Mod assist;+2 safety/equipment       General transfer comment: cues for technique and to try to decr anxiety  Ambulation/Gait             General Gait Details: unable to decrease pt's anxiety enough for her to try.   Stairs            Wheelchair Mobility    Modified Rankin (Stroke Patients Only)       Balance Overall balance assessment: Needs assistance Sitting-balance support: Feet supported;No upper extremity supported Sitting balance-Leahy Scale: Fair                              Cognition Arousal/Alertness: Awake/alert Behavior During Therapy: Anxious Overall Cognitive Status: Within Functional Limits for tasks  assessed         Following Commands: Follows one step commands consistently;Follows one step commands with increased time Safety/Judgement: Decreased awareness of deficits;Decreased awareness of safety     General Comments: Anxious and procrastinating    Exercises      General Comments General comments (skin integrity, edema, etc.): reinforced back care and precautions, log roll and bracing, but much of the time spent trying to decrease fears and anxiety      Pertinent Vitals/Pain Pain Assessment: 0-10 Pain Score: 9  Faces Pain Scale: Hurts whole lot Pain Intervention(s): Premedicated before session    Home Living                      Prior Function            PT Goals (current goals can now be found in the care plan section) Acute Rehab PT Goals Patient Stated Goal: get her independence back PT Goal Formulation: With patient Time For Goal Achievement: 11/04/13 Potential to Achieve Goals: Good Progress towards PT goals: Progressing toward goals    Frequency  Min 5X/week    PT Plan Current plan remains appropriate    Co-evaluation PT/OT/SLP Co-Evaluation/Treatment:  (safety and anxiety)           End of Session Equipment Utilized During Treatment: Gait belt;Back brace Activity Tolerance: Patient tolerated treatment well;Other (comment) (limited by anxiety and fear) Patient left: in chair;with call bell/phone within reach;with family/visitor present     Time: 1125-1201 PT Time Calculation (  min): 36 min  Charges:  $Therapeutic Activity: 23-37 mins                    G Codes:      Oliwia Berzins, Tessie Fass 10/27/2013, 1:02 PM 10/27/2013  Donnella Sham, PT [phone]  (pager)

## 2013-10-27 NOTE — Progress Notes (Signed)
Occupational Therapy Treatment Patient Details Name: Erica [name] MRN: [id number] DOB: Sep 14, 1933 Today's Date: 10/27/2013    History of present illness Readmitted back from CIR with loss of fixation at L1-2, s/p T11-L4 redo fixation.   OT comments  Pt anxious during session and required encouragement. Pt ambulated a few steps and then returned to bed.   Follow Up Recommendations  Supervision/Assistance - 24 hour;CIR    Equipment Recommendations  3 in 1 bedside comode    Recommendations for Other Services      Precautions / Restrictions Precautions Precautions: Back;Fall Precaution Comments: Reviewed precautions Required Braces or Orthoses: Spinal Brace Spinal Brace: Thoracolumbosacral orthotic;Applied in supine position Restrictions Weight Bearing Restrictions: No       Mobility Bed Mobility Overal bed mobility: Needs Assistance Bed Mobility: Rolling;Sit to Sidelying Rolling: Mod assist     Sit to sidelying: Mod assist General bed mobility comments: Assistance with LE's when going to sidelying position. Cues for technique.  Transfers Overall transfer level: Needs assistance Equipment used: Rolling walker (2 wheeled) Transfers: Sit to/from [facility] Sit to Stand: Mod assist;Max assist;+2 safety/equipment Stand pivot transfers: Mod assist       General transfer comment: cues for technique. Pt trying to sit before therapist ready.        ADL Overall ADL's : Needs assistance/impaired                 Upper Body Dressing : Total assistance;Bed level (doffed brace)       Toilet Transfer: Moderate assistance;+2 for safety/equipment;Ambulation;Stand-pivot;RW (bed/chair)           Functional mobility during ADLs: Moderate assistance;+2 for safety/equipment;Rolling walker General ADL Comments: Encouragment needed during session. Pt ambulated a little in room and then went back to bed.       Vision                      Perception     Praxis      Cognition   Behavior During Therapy: Anxious Overall Cognitive Status: Within Functional Limits for tasks assessed          Following Commands: Follows one step commands consistently;Follows one step commands with increased time Safety/Judgement:Decreased awareness of safety     General Comments: Anxious and procrastinating    Extremity/Trunk Assessment               Exercises     Shoulder Instructions       General Comments      Pertinent Vitals/ Pain       Pain Assessment:  (discomfort) Pain Intervention(s): Repositioned  Home Living                                          Prior Functioning/Environment              Frequency Min 2X/week     Progress Toward Goals  OT Goals(current goals can now be found in the care plan section)  Progress towards OT goals: Progressing toward goals  Acute Rehab OT Goals Patient Stated Goal: not stated OT Goal Formulation: With patient Time For Goal Achievement: 12/09/13 Potential to Achieve Goals: Good ADL Goals Pt Will Perform Grooming: with modified independence;standing Pt Will Perform Upper Body Bathing: with supervision;sitting Pt Will Perform Lower Body Bathing: with modified independence;with adaptive equipment;sit to/from stand Pt Will Perform Lower Body  Dressing: with modified independence;with adaptive equipment;sit to/from stand Pt Will Transfer to Toilet: with modified independence;ambulating (3 in 1 over commode) Pt Will Perform Toileting - Clothing Manipulation and hygiene: with modified independence;sit to/from stand Additional ADL Goal #1: Pt will perform bed mobility at supervision level as precursor for ADLs.  Additional ADL Goal #2: Family will be independent in donning/doffing back brace.  Plan Discharge plan remains appropriate    Co-evaluation                 End of Session Equipment Utilized During Treatment: Gait belt;Rolling  walker;Back brace   Activity Tolerance Other (comment);Patient limited by fatigue (anxious)   Patient Left in bed;with call bell/phone within reach;with bed alarm set;with family/visitor present   Nurse Communication Mobility status        Time: 1884-1660 OT Time Calculation (min): 20 min  Charges: OT General Charges $OT Visit: 1 Procedure OT Treatments $Therapeutic Activity: 8-22 mins  Benito Mccreedy OTR/L [phone] 10/27/2013, 3:08 PM

## 2013-10-27 NOTE — Progress Notes (Signed)
PM&R Consult Service  Pt well known to [place] from recent rehab admit. Has resumed therapy after surgical stabilization of her spine. Given her history and what I am seeing at this time with therapy, SNF is probably the best option for her. I had discussed this with her children prior to the transfer, that SNF might be the best fit for post-op her given pain,anxiety, activity tolerance issues. Will follow along.  Meredith Staggers, MD, [place]

## 2013-10-27 NOTE — Progress Notes (Signed)
Subjective: Patient reports Feels generalized weakness after first day of ambulation to  Objective: Vital signs in last 24 hours: Temp:  [97.4 F (36.3 C)-98.7 F (37.1 C)] 98.4 F (36.9 C) (09/01 0543) Pulse Rate:  [76-97] 79 (09/01 0543) Resp:  [18-20] 20 (09/01 0543) BP: (112-161)/(42-62) 132/60 mmHg (09/01 0543) SpO2:  [97 %-99 %] 98 % (09/01 0543)  Intake/Output from previous day: 08/31 0701 - 09/01 0700 In: -  Out: 1200 [Urine:1200] Intake/Output this shift:    Incision is clean and dry. Motor function appears intact in major groups including the iliopsoas quad tibialis anterior and gastrocs. Foley catheter still in place.  Lab Results: No results found for this basename: WBC, HGB, HCT, PLT,  in the last 72 hours BMET No results found for this basename: NA, K, CL, CO2, GLUCOSE, BUN, CREATININE, CALCIUM,  in the last 72 hours  Studies/Results: No results found.  Assessment/Plan: Stable postop, constipation, postoperative hemoglobin 11.4. Mild acute blood loss anemia secondary to surgery.  LOS: 4 days  Add milk of magnesia for constipation. Will ask rehabilitation medicine to see patient again the   Erica [name] 10/27/2013, 1:47 PM

## 2013-10-28 NOTE — Progress Notes (Signed)
Patient ID: Erica [name], female   DOB: 10-06-1933, 80 y.o.   MRN: [id number] Vital signs are stable. Patient feels puny today Bowels have not moved in several days Suspect increased back pain may be related to constipation Incision is clean and dry. See note of Dr. Tessa Lerner did suggest SNF may be a better choice

## 2013-10-28 NOTE — Clinical Social Work Psychosocial (Signed)
Clinical Social Work Department BRIEF PSYCHOSOCIAL ASSESSMENT 10/28/2013  Patient:  Erica [name], Erica [name]     Account Number:  [id number]     Admit date:  10/23/2013  Clinical Social Worker:  Delrae Sawyers  Date/Time:  10/28/2013 12:50 PM  Referred by:  Physician  Date Referred:  10/28/2013 Referred for  SNF Placement   Other Referral:   none.   Interview type:  Family Other interview type:   CSW spoke with pt's daughter, Shaune Leeks [phone]).    PSYCHOSOCIAL DATA Living Status:  ALONE Admitted from facility:   Level of care:   Primary support name:  Shaune Leeks Primary support relationship to patient:  CHILD, ADULT Degree of support available:   Strong support system.    CURRENT CONCERNS Current Concerns  Post-Acute Placement   Other Concerns:   none.    SOCIAL WORK ASSESSMENT / PLAN CSW received referral for SNF placement at time of discharge as pt does not meet criteria for CIR. CSW spoke with pt's daughter regarding discharge disposition. Pt's daughter states pt is from home alone (living in a [facility]), but will be living with pt's daughter once completing rehabilitation. Pt's daughter expressed interest in [facility]F. CSW to continue to follow and assist with discharge planning needs.   Assessment/plan status:  Psychosocial Support/Ongoing Assessment of Needs Other assessment/ plan:   none.   Information/referral to community resources:   [facility] bed offers.    PATIENT'S/FAMILY'S RESPONSE TO PLAN OF CARE: Pt's daughter understanding and agreeable to CSW plan of care. Pt's daughter expressed no further questions or concerns at this time.       Lubertha Sayres, MSW, [facility] Licensed Clinical Social Worker [phone] and [phone]

## 2013-10-28 NOTE — Progress Notes (Signed)
Physical Therapy Treatment Patient Details Name: JAILEE JAQUEZ MRN: [id number] DOB: 05/24/1933 Today's Date: 10/28/2013    History of Present Illness Readmitted back from CIR with loss of fixation at L1-2, s/p T11-L4 redo fixation.    PT Comments    Progressing slowly. Needs lots of encouragement, but taking a "tough love" approach seems to work.  Will benefit from inpatient rehab.  Follow Up Recommendations  CIR     Equipment Recommendations  Rolling walker with 5" wheels;3in1 (PT)    Recommendations for Other Services Rehab consult     Precautions / Restrictions Precautions Precautions: Back;Fall Precaution Comments: Reviewed precautions Required Braces or Orthoses: Spinal Brace Spinal Brace: Thoracolumbosacral orthotic;Applied in supine position    Mobility  Bed Mobility   Bed Mobility: Rolling;Sidelying to Sit Rolling: Mod assist Sidelying to sit: Mod assist       General bed mobility comments: Working on initiation and practicing rolling and transition to sit.  Still needing significant assist due to pain and anxiety  Transfers Overall transfer level: Needs assistance Equipment used: Rolling walker (2 wheeled) Transfers: Sit to/from Stand Sit to Stand: Min assist         General transfer comment: cues for hand placement and general technique  Ambulation/Gait Ambulation/Gait assistance: Min guard Ambulation Distance (Feet): 40 Feet (then 48 feet with rest in between) Assistive device: Rolling walker (2 wheeled) Gait Pattern/deviations: Step-through pattern;Decreased step length - right;Decreased step length - left;Decreased stride length Gait velocity: decreased   General Gait Details: steady and very guarded.  needs RW advanced to get her progressing forward.   Stairs            Wheelchair Mobility    Modified Rankin (Stroke Patients Only)       Balance Overall balance assessment: Needs assistance Sitting-balance support: No upper  extremity supported Sitting balance-Leahy Scale: Fair       Standing balance-Leahy Scale: Fair                      Cognition Arousal/Alertness: Awake/alert Behavior During Therapy: Anxious Overall Cognitive Status: Within Functional Limits for tasks assessed         Following Commands: Follows one step commands consistently       General Comments: Anxious and procrastinating    Exercises      General Comments General comments (skin integrity, edema, etc.): reinforced all back care/prec, log roll, bracing issues, lifting restrictions and typical progressio of activity from this point      Pertinent Vitals/Pain Pain Assessment: Faces Faces Pain Scale: Hurts even more Pain Intervention(s): Repositioned    Home Living                      Prior Function            PT Goals (current goals can now be found in the care plan section) Acute Rehab PT Goals Patient Stated Goal: not stated PT Goal Formulation: With patient Time For Goal Achievement: 11/04/13 Potential to Achieve Goals: Good Progress towards PT goals: Progressing toward goals    Frequency  Min 5X/week    PT Plan Current plan remains appropriate    Co-evaluation             End of Session Equipment Utilized During Treatment: Back brace Activity Tolerance: Patient tolerated treatment well;Other (comment) Patient left: in chair;with call bell/phone within reach;with family/visitor present     Time: 0947-0962 PT Time Calculation (min): 42 min  Charges:  $Gait Training: 8-22 mins $Therapeutic Activity: 8-22 mins $Self Care/Home Management: 8-22                    G Codes:      Kalany Diekmann, Tessie Fass 10/28/2013, 12:46 PM 10/28/2013  Donnella Sham, PT [phone]  (pager)

## 2013-10-28 NOTE — Progress Notes (Signed)
UR complete.  Maxim Bedel RN, MSN 

## 2013-10-28 NOTE — Clinical Social Work Placement (Addendum)
Clinical Social Work Department CLINICAL SOCIAL WORK PLACEMENT NOTE 10/28/2013  Patient:  Erica [name], Erica [name]  Account Number:  [id number] Admit date:  10/23/2013  Clinical Social Worker:  Delrae Sawyers  Date/time:  10/28/2013 12:55 PM  Clinical Social Work is seeking post-discharge placement for this patient at the following level of care:   [facility]   (*CSW will update this form in Epic as items are completed)   10/28/2013  Patient/family provided with [facility] Department of Clinical Social Work's list of facilities offering this level of care within the geographic area requested by the patient (or if unable, by the patient's family).  10/28/2013  Patient/family informed of their freedom to choose among providers that offer the needed level of care, that participate in Medicare, Medicaid or managed care program needed by the patient, have an available bed and are willing to accept the patient.  10/28/2013  Patient/family informed of [facility]' ownership interest in [facility], as well as of the fact that they are under no obligation to receive care at this facility.  PASARR submitted to EDS on 10/28/2013 PASARR number received on 10/28/2013  FL2 transmitted to all facilities in geographic area requested by pt/family on  10/28/2013 FL2 transmitted to all facilities within larger geographic area on   Patient informed that his/her managed care company has contracts with or will negotiate with  certain facilities, including the following:     Patient/family informed of bed offers received:  10/30/2013 Patient chooses bed at  Physician recommends and patient chooses bed at    Patient to be transferred to [facility] on 10/31/13   Patient to be transferred to facility by [facility]  Patient and family notified of transfer on 10/31/13  Name of family member notified: Andree Coss at bedside.     The following physician request were entered in Epic:   Additional  Comments:  Lubertha Sayres, MSW, [facility] Licensed Clinical Social Worker [phone] and [phone]

## 2013-10-28 NOTE — Progress Notes (Signed)
During hourly rounds on night shift, pt appeared to be sleeping throughout the night, did not call for pain medication once, or ask for during neuro checks/vs checks. When giving pt scheduled medication of ultram and robaxin at 0600, complaining of 10 out of 10 pain. Gave pt medication and 20 min later calling for pain medication, stating she has not had any medication. Will continue to monitor.

## 2013-10-28 NOTE — Progress Notes (Signed)
Rehab admissions - I am following pt's case and made note of Dr. Charm Barges recommendation for SNF. Per rehab MD: "Given her history and what I am seeing at this time with therapy, SNF is probably the best option for her. I had discussed this with her children prior to the transfer, that SNF might be the best fit for post-op her given pain,anxiety, activity tolerance issues."  I discussed this recommendation with pt and her daughter and they are understanding. They were really pleased with pt's progress today with therapies but understand that rehab MD feels that SNF is most appropriate avenue. Daughter also stated, "we want mom to stay here a few more days and get off the catheter and use the potty." I stated that Dr. Ellene Route would determine when pt was medically stable for discharge to SNF.  I updated Raquel Sarna with social work and Stage manager, Tourist information centre manager. I will now sign off pt's case in light of recommendation for SNF.   Please call me with any questions. Thanks.  Nanetta Batty, PT Rehabilitation Admissions Coordinator [phone]

## 2013-10-29 MED ORDER — POLYETHYLENE GLYCOL 3350 17 G PO PACK
17.0000 g | PACK | Freq: Every day | ORAL | Status: DC | PRN
Start: 2013-10-29 — End: 2013-10-31
  Administered 2013-10-29 – 2013-10-30 (×2): 17 g via ORAL
  Filled 2013-10-29 (×2): qty 1

## 2013-10-29 NOTE — Progress Notes (Signed)
Occupational Therapy Treatment Patient Details Name: Erica [name] MRN: [id number] DOB: May 05, 1933 Today's Date: 10/29/2013    History of present illness Readmitted back from CIR with loss of fixation at L1-2, s/p T11-L4 redo fixation.   OT comments  All AE education complete with family present. Pt demonstrates toilet transfer with (A). Pt progressing toward goals and continues to need OT. Ot to follow acutely for bed mobility adn don doff brace.   Follow Up Recommendations  Supervision/Assistance - 24 hour;SNF    Equipment Recommendations  3 in 1 bedside comode    Recommendations for Other Services      Precautions / Restrictions Precautions Precautions: Back;Fall Precaution Booklet Issued: Yes (comment) Required Braces or Orthoses: Spinal Brace Spinal Brace: Thoracolumbosacral orthotic;Applied in supine position       Mobility Bed Mobility Overal bed mobility: Needs Assistance Bed Mobility: Rolling;Sidelying to Sit Rolling: Min assist Sidelying to sit: Mod assist       General bed mobility comments: in chair on arrival  Transfers Overall transfer level: Needs assistance Equipment used: Rolling walker (2 wheeled) Transfers: Sit to/from Stand Sit to Stand: Min assist         General transfer comment: incr time to complete task    Balance Overall balance assessment: No apparent balance deficits (not formally assessed) Sitting-balance support: No upper extremity supported Sitting balance-Leahy Scale: Fair     Standing balance support: No upper extremity supported;Bilateral upper extremity supported Standing balance-Leahy Scale: Fair                     ADL                       Lower Body Dressing: Min guard;Sit to/from stand;With adaptive equipment   Toilet Transfer: Minimal assistance;Ambulation;RW;Grab bars;Regular Museum/gallery exhibitions officer and Hygiene: Min guard;Sitting/lateral lean       Functional mobility  during ADLs: Minimal assistance;Rolling walker General ADL Comments: daughter with purchased AE in room. pt demo don doff socks. pt able to cross Lt LE. pt and family educated on shower with brace and need to complete partial supine. pt educated on peri care and able to reach with lateral lean. pt compelte toilet transfer and unable to complete bowel movement      Vision                     Perception     Praxis      Cognition   Behavior During Therapy: Anxious Overall Cognitive Status: Within Functional Limits for tasks assessed       Memory: Decreased recall of precautions;Decreased short-term memory  Following Commands: Follows one step commands consistently       General Comments: pt very fixated on having bowel movement.     Extremity/Trunk Assessment               Exercises     Shoulder Instructions       General Comments      Pertinent Vitals/ Pain       Pain Assessment: No/denies pain  Home Living                                          Prior Functioning/Environment              Frequency Min 2X/week  Progress Toward Goals  OT Goals(current goals can now be found in the care plan section)  Progress towards OT goals: Progressing toward goals  Acute Rehab OT Goals Patient Stated Goal: to get better OT Goal Formulation: With patient Time For Goal Achievement: 12/09/13 Potential to Achieve Goals: Good ADL Goals Pt Will Perform Grooming: with modified independence;standing Pt Will Perform Upper Body Bathing: with supervision;sitting Pt Will Perform Lower Body Bathing: with modified independence;with adaptive equipment;sit to/from stand Pt Will Perform Lower Body Dressing: with modified independence;with adaptive equipment;sit to/from stand Pt Will Transfer to Toilet: with modified independence;ambulating Pt Will Perform Toileting - Clothing Manipulation and hygiene: with modified independence;sit to/from  stand Additional ADL Goal #1: Pt will perform bed mobility at supervision level as precursor for ADLs.  Additional ADL Goal #2: Family will be independent in donning/doffing back brace.  Plan Discharge plan remains appropriate    Co-evaluation                 End of Session Equipment Utilized During Treatment: Gait belt;Rolling walker;Back brace   Activity Tolerance Patient tolerated treatment well   Patient Left in chair;with call bell/phone within reach;with family/visitor present   Nurse Communication Mobility status;Precautions        Time: 5885-0277 OT Time Calculation (min): 55 min  Charges: OT General Charges $OT Visit: 1 Procedure OT Treatments $Self Care/Home Management : 53-67 mins  Peri Maris 10/29/2013, 4:50 PM Pager: [phone]

## 2013-10-29 NOTE — Progress Notes (Signed)
Physical Therapy Treatment Patient Details Name: Erica [name] MRN: [id number] DOB: 05/08/1933 Today's Date: 10/29/2013    History of Present Illness Readmitted back from CIR with loss of fixation at L1-2, s/p T11-L4 redo fixation.    PT Comments    Progressing better lately with better participation and initiation of task.  Emphasis on bed and transfer safety as well as increasing gait distance and stamina.  Follow Up Recommendations  SNF     Equipment Recommendations  Rolling walker with 5" wheels;3in1 (PT)    Recommendations for Other Services Rehab consult     Precautions / Restrictions Precautions Precautions: Back;Fall Precaution Booklet Issued: Yes (comment) Required Braces or Orthoses: Spinal Brace Spinal Brace: Thoracolumbosacral orthotic;Applied in supine position    Mobility  Bed Mobility Overal bed mobility: Needs Assistance Bed Mobility: Rolling;Sidelying to Sit Rolling: Min assist Sidelying to sit: Mod assist       General bed mobility comments: Better initiation; cues for sequence and ways to build momentum  Transfers Overall transfer level: Needs assistance Equipment used: Rolling walker (2 wheeled) Transfers: Sit to/from Stand Sit to Stand: Min assist         General transfer comment: cues for hand placement and general technique  Ambulation/Gait Ambulation/Gait assistance: Min guard Ambulation Distance (Feet): 98 Feet (then 58 additional feet after rest.) Assistive device: Rolling walker (2 wheeled) Gait Pattern/deviations: Step-through pattern Gait velocity: decreased   General Gait Details: steady, but still guarded with need to help push RW due to pt with little forward momentum   Stairs Stairs: Yes Stairs assistance: Min assist Stair Management: One rail Right;Step to pattern;Forwards Number of Stairs: 6 General stair comments: steady, but guarded  Wheelchair Mobility    Modified Rankin (Stroke Patients Only)        Balance Overall balance assessment: No apparent balance deficits (not formally assessed) Sitting-balance support: No upper extremity supported Sitting balance-Leahy Scale: Fair     Standing balance support: No upper extremity supported;Bilateral upper extremity supported Standing balance-Leahy Scale: Fair                      Cognition Arousal/Alertness: Awake/alert Behavior During Therapy: Anxious Overall Cognitive Status: Within Functional Limits for tasks assessed       Memory: Decreased recall of precautions;Decreased short-term memory Following Commands: Follows one step commands consistently       General Comments: Anxious and procrastinating    Exercises      General Comments General comments (skin integrity, edema, etc.): Reinforced back care/prec, log roll and transition side to sit, lifting restrications, brace issues (donning/doffing), progression of activity.      Pertinent Vitals/Pain Pain Assessment: No/denies pain (just a little discomfort)    Home Living                      Prior Function            PT Goals (current goals can now be found in the care plan section) Acute Rehab PT Goals Patient Stated Goal: not stated PT Goal Formulation: With patient Time For Goal Achievement: 11/04/13 Potential to Achieve Goals: Good Progress towards PT goals: Progressing toward goals    Frequency  Min 5X/week    PT Plan Current plan remains appropriate    Co-evaluation             End of Session Equipment Utilized During Treatment: Back brace Activity Tolerance: Patient tolerated treatment well;Other (comment) Patient left: in chair;with call  bell/phone within reach;with family/visitor present     Time: 8309-4076 PT Time Calculation (min): 40 min  Charges:  $Gait Training: 8-22 mins $Therapeutic Activity: 8-22 mins $Self Care/Home Management: 10/22/22                    G Codes:      Violeta Lecount, Tessie Fass 10/29/2013, 3:01  PM

## 2013-10-29 NOTE — Progress Notes (Signed)
Subjective: Patient reports Reports ambulating today. Back pain is lessening. Family is looking into skilled nursing facility.  Objective: Vital signs in last 24 hours: Temp:  [97.7 F (36.5 C)-98.5 F (36.9 C)] 97.9 F (36.6 C) (09/03 1833) Pulse Rate:  [74-99] 99 (09/03 1833) Resp:  [18-20] 18 (09/03 1833) BP: (117-157)/(54-79) 146/59 mmHg (09/03 1833) SpO2:  [96 %-100 %] 100 % (09/03 1833)  Intake/Output from previous day: 09/02 0701 - 09/03 0700 In: 3 [I.V.:3] Out: -  Intake/Output this shift:    Lower extremity motor function is intact  Lab Results: No results found for this basename: WBC, HGB, HCT, PLT,  in the last 72 hours BMET No results found for this basename: NA, K, CL, CO2, GLUCOSE, BUN, CREATININE, CALCIUM,  in the last 72 hours  Studies/Results: No results found.  Assessment/Plan: Stable postop  LOS: 6 days  Suture removal, treat constipation, remove Foley, evaluate UA   Arie Gable J 10/29/2013, 8:51 PM

## 2013-10-30 LAB — URINALYSIS, ROUTINE W REFLEX MICROSCOPIC
BILIRUBIN URINE: NEGATIVE
Glucose, UA: NEGATIVE mg/dL
Ketones, ur: NEGATIVE mg/dL
Nitrite: NEGATIVE
PROTEIN: NEGATIVE mg/dL
Specific Gravity, Urine: 1.009 (ref 1.005–1.030)
UROBILINOGEN UA: 1 mg/dL (ref 0.0–1.0)
pH: 7 (ref 5.0–8.0)

## 2013-10-30 LAB — URINE MICROSCOPIC-ADD ON

## 2013-10-30 MED ORDER — OXYCODONE-ACETAMINOPHEN 5-325 MG PO TABS: 1.0000 | ORAL_TABLET | ORAL | Status: DC | PRN

## 2013-10-30 NOTE — Clinical Social Work Note (Signed)
Pt's family preferred SNF choice, [facility] [[facility] unable to make SNF bed offer as facility does not accept pt's health insurance. Pt's family updated at bedside. Pt's family provided with a list of SNF bed offers; pt's family to tour facilities in order to make bed choice. CSW to continue to follow and assist with discharge planning needs.  Lubertha Sayres, MSW, [facility] Licensed Clinical Social Worker [phone] and [phone]

## 2013-10-30 NOTE — Progress Notes (Signed)
Staples D/C, incision intact / unremarkable, cleansed & dressed per order.

## 2013-10-30 NOTE — Care Management Note (Addendum)
  Page 1 of 1   10/30/2013     5:10:38 PM CARE MANAGEMENT NOTE 10/30/2013  Patient:  Erica [name], Erica [name]   Account Number:  [id number]  Date Initiated:  10/26/2013  Documentation initiated by:  Erica [name]  Subjective/Objective Assessment:   Patient was admitted for failed hardware related to [name] L1-2 fracture fixation. Admitted from [facility], plans to stay with adult children when d/c'd from hospital/rehab     Action/Plan:   Will follow for discharge needs pending PT/Ot evals and physician orders.   Anticipated DC Date:     Anticipated DC Plan:  IP REHAB FACILITY         Choice offered to / List presented to:             Status of service:  In process, will continue to follow Medicare Important Message given?  YES (If response is "NO", the following Medicare IM given date fields will be blank) Date Medicare IM given:  10/30/2013 Medicare IM given by:  Erica [name] Date Additional Medicare IM given:   Additional Medicare IM given by:    Discharge Disposition:    Per UR Regulation:  Reviewed for med. necessity/level of care/duration of stay  If discussed at [place] of Stay Meetings, dates discussed:    Comments:  10/30/13 [place] RN, MSN, CM- Met with patient's son extensively to discuss discharge planning. Patient's son is aware that patient is tentatively planned for discharge to snf tomorrow. Discharge process was explained in detail.  Patient's son is agreeable to transfer to snf tomorrow, if patient remains medically stable.      10/30/13 [place], MSN, CM- Spoke with patient's son Erica [name] via phone to discuss discharge planning and review the Medicare IM letter. Son verbalizes understanding of process and would like to discuss discharge planning further in person. CM awaits son's arrival to discuss.  CM spoke with Erica [name], who is aware that patient can go to snf over the weekend, if [name] discharge summary is available later today.   10/30/13 [place], MSN, CM- Medicare IM letter provided.  CM will follow-up with family to review when they arrive this afternoon.
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
## 2013-10-30 NOTE — Progress Notes (Signed)
Physical Therapy Treatment Patient Details Name: Erica [name] MRN: [id number] DOB: Aug 15, 1933 Today's Date: 10/30/2013    History of Present Illness Readmitted back from CIR with loss of fixation at L1-2, s/p T11-L4 redo fixation.    PT Comments    Pt with limited mobility tolerance this date due to perseveration on having BM and urinating. Pt remains to require assist for don/doffing brace and all transfers. Pt con't to benefit from SNF upon d/c to address mentioned deficits and progress mobility and level of function for safe transition home.   Follow Up Recommendations  SNF     Equipment Recommendations  Rolling walker with 5" wheels;3in1 (PT)    Recommendations for Other Services       Precautions / Restrictions Precautions Precautions: Back;Fall Precaution Booklet Issued: Yes (comment) Precaution Comments: Reviewed precautions, HOB <30 unless brace on Required Braces or Orthoses: Spinal Brace Spinal Brace: Thoracolumbosacral orthotic;Applied in supine position Other Brace/Splint: TLSO donned in supine Restrictions Weight Bearing Restrictions: No    Mobility  Bed Mobility Overal bed mobility: Needs Assistance Bed Mobility: Rolling;Sidelying to Sit Rolling: Min assist Sidelying to sit: Mod assist       General bed mobility comments: max v/c's with minA to complete rolling, modA for trunk elevation to sit at EOB  Transfers Overall transfer level: Needs assistance Equipment used: Rolling walker (2 wheeled) Transfers: Sit to/from [facility] Sit to Stand: Mod assist Stand pivot transfers: Mod assist       General transfer comment: increased time, pt with c/o "i'm not steady in my head yet." pt with posterior lean. upon standing pt began urinating. Pt modA for stand pvt to chair due to urinating and managing walker  Ambulation/Gait             General Gait Details: unable to ambulate this date due to pt trying to have BM. pt reports "I  can't walk now, I really need to have BM." Pt educated on benefits of ambulation to begin stimulating bowels.   Stairs            Wheelchair Mobility    Modified Rankin (Stroke Patients Only)       Balance Overall balance assessment: Needs assistance Sitting-balance support: Bilateral upper extremity supported;Feet supported Sitting balance-Leahy Scale: Poor   Postural control: Posterior lean Standing balance support: Bilateral upper extremity supported Standing balance-Leahy Scale: Poor Standing balance comment: pt anxious about urinating and having BM                    Cognition Arousal/Alertness: Awake/alert Behavior During Therapy: Anxious Overall Cognitive Status: Within Functional Limits for tasks assessed                 General Comments: pt extremely anxious and perseverating on going to the bathroom since folley removed. many v/c's for deep breathing    Exercises      General Comments        Pertinent Vitals/Pain Pain Assessment: Faces Faces Pain Scale: Hurts whole lot Pain Location: discomfort of unable to have bowel mvmt, full bladder, and surgical pain Pain Intervention(s): Monitored during session;Limited activity within patient's tolerance    Home Living                      Prior Function            PT Goals (current goals can now be found in the care plan section) Progress towards PT goals: Progressing toward  goals    Frequency  Min 5X/week    PT Plan Current plan remains appropriate    Co-evaluation             End of Session Equipment Utilized During Treatment: Back brace Activity Tolerance: Patient tolerated treatment well;Other (comment) Patient left: in chair;with call bell/phone within reach;with family/visitor present     Time: 3546-5681 PT Time Calculation (min): 23 min  Charges:  $Therapeutic Activity: 23-37 mins                    G Codes:      Kingsley Callander 10/30/2013, 2:19  PM  Kittie Plater, PT, DPT Pager #: [phone] Office #: [phone]

## 2013-10-31 NOTE — Progress Notes (Addendum)
Patient is medically stable for D/C to [facility]. Per [facility] admissions coordinator at [facility] patient can come today and is going to room 611-B. Clinical Education officer, museum (CSW) prepared D/C packet, faxed D/C summary to facility, and arranged EMS for transport. Patient's son [name] is at bedside. Nursing is aware of above. Please reconsult if future social work needs arise. CSW signing off.   Blima Rich, [place] [facility] CSW [phone]

## 2013-10-31 NOTE — Progress Notes (Signed)
Transferred out via stretcher by PTAR.

## 2013-10-31 NOTE — Progress Notes (Signed)
Subjective: Patient reports improving  Objective: Vital signs in last 24 hours: Temp:  [97.5 F (36.4 C)-98.9 F (37.2 C)] 98.2 F (36.8 C) (09/05 0542) Pulse Rate:  [80-101] 84 (09/05 0542) Resp:  [16-18] 18 (09/05 0542) BP: (127-156)/(58-66) 127/58 mmHg (09/05 0542) SpO2:  [98 %-100 %] 98 % (09/05 0542)  Intake/Output from previous day: 09/04 0701 - 09/05 0700 In: 480 [P.O.:480] Out: 625 [Urine:625] Intake/Output this shift: Total I/O In: -  Out: 625 [Urine:625]  Physical Exam: Patient continuing to make progress.  Lab Results: No results found for this basename: WBC, HGB, HCT, PLT,  in the last 72 hours BMET No results found for this basename: NA, K, CL, CO2, GLUCOSE, BUN, CREATININE, CALCIUM,  in the last 72 hours  Studies/Results: No results found.  Assessment/Plan:  Required ISC last night.  Will make sure she is able to void this AM prior to transfer.    LOS: 8 days    Peggyann Shoals, MD 10/31/2013, 6:52 AM

## 2013-10-31 NOTE — Progress Notes (Signed)
Called report to Tiffany Kocher, Production assistant, radio at [employer]; all questions answered. Awaiting PTAR to transfer out. Pt family at the bedside aware of discharge plan.

## 2013-11-10 ENCOUNTER — Encounter (HOSPITAL_COMMUNITY): Payer: Self-pay | Admitting: Neurological Surgery

## 2013-12-28 ENCOUNTER — Other Ambulatory Visit: Payer: Self-pay | Admitting: Neurological Surgery

## 2014-01-01 NOTE — Pre-Procedure Instructions (Signed)
KATHALINA OSTERMANN  01/01/2014   Your procedure is scheduled on: Monday, January 11, 2014  Report to [facility] Admitting at 11:30 AM.( per MD)  Call this number if you have problems the morning of surgery: [phone]   Remember:   Do not eat food or drink liquids after midnight Sunday, January 10, 2014   Take these medicines the morning of surgery with A SIP OF WATER, ,  metoprolol tartrate (LOPRESSOR),  traMADol (ULTRAM) , if needed: Oxycodone for pain, Tylenol extra strength   Stop taking Aspirin, vitamins, and herbal medications. Do not take any NSAIDs ie: Ibuprofen, Advil, Naproxen or any medication containing Aspirin; stop 5 days prior to procedure ( Wednesday, January 06, 2014)   Do not wear jewelry, make-up or nail polish.  Do not wear lotions, powders, or perfumes. You may not wear deodorant.  Do not shave 48 hours prior to surgery.   Do not bring valuables to the hospital.  [facility] is not responsible for any belongings or valuables.               Contacts, dentures or bridgework may not be worn into surgery.  Leave suitcase in the car. After surgery it may be brought to your room.  For patients admitted to the hospital, discharge time is determined by your treatment team.               Patients discharged the day of surgery will not be allowed to drive home.  Name and phone number of your driver: /w family  Special Instructions:  Special Instructions:Special Instructions: [place] - Preparing for Surgery  Before surgery, you can play an important role.  Because skin is not sterile, your skin needs to be as free of germs as possible.  You can reduce the number of germs on you skin by washing with CHG (chlorahexidine gluconate) soap before surgery.  CHG is an antiseptic cleaner which kills germs and bonds with the skin to continue killing germs even after washing.  Please DO NOT use if you have an allergy to CHG or antibacterial soaps.  If your skin becomes  reddened/irritated stop using the CHG and inform your nurse when you arrive at Short Stay.  Do not shave (including legs and underarms) for at least 48 hours prior to the first CHG shower.  You may shave your face.  Please follow these instructions carefully:   1.  Shower with CHG Soap the night before surgery and the morning of Surgery.  2.  If you choose to wash your hair, wash your hair first as usual with your normal shampoo.  3.  After you shampoo, rinse your hair and body thoroughly to remove the Shampoo.  4.  Use CHG as you would any other liquid soap.  You can apply chg directly  to the skin and wash gently with scrungie or a clean washcloth.  5.  Apply the CHG Soap to your body ONLY FROM THE NECK DOWN.  Do not use on open wounds or open sores.  Avoid contact with your eyes, ears, mouth and genitals (private parts).  Wash genitals (private parts) with your normal soap.  6.  Wash thoroughly, paying special attention to the area where your surgery will be performed.  7.  Thoroughly rinse your body with warm water from the neck down.  8.  DO NOT shower/wash with your normal soap after using and rinsing off the CHG Soap.  9.  Pat yourself dry  with a clean towel.            10.  Wear clean pajamas.            11.  Place clean sheets on your bed the night of your first shower and do not sleep with pets.  Day of Surgery  Do not apply any lotions/deodorants the morning of surgery.  Please wear clean clothes to the hospital/surgery center.   Please read over the following fact sheets that you were given: Pain Booklet and Surgical Site Infection Prevention

## 2014-01-04 ENCOUNTER — Encounter (HOSPITAL_COMMUNITY)
Admission: RE | Admit: 2014-01-04 | Discharge: 2014-01-04 | Disposition: A | Discharge status: Home / Self Care | Payer: Medicare HMO | Source: Ambulatory Visit | Attending: Neurological Surgery | Admitting: Neurological Surgery

## 2014-01-04 ENCOUNTER — Encounter (HOSPITAL_COMMUNITY): Payer: Self-pay

## 2014-01-04 DIAGNOSIS — Z481 Encounter for planned postprocedural wound closure: Secondary | ICD-10-CM | POA: Insufficient documentation

## 2014-01-04 DIAGNOSIS — Z01812 Encounter for preprocedural laboratory examination: Secondary | ICD-10-CM | POA: Diagnosis present

## 2014-01-04 HISTORY — DX: Malignant (primary) neoplasm, unspecified: C80.1

## 2014-01-04 HISTORY — DX: Unspecified osteoarthritis, unspecified site: M19.90

## 2014-01-04 HISTORY — DX: Personal history of other medical treatment: Z92.89

## 2014-01-04 HISTORY — DX: Anxiety disorder, unspecified: F41.9

## 2014-01-04 HISTORY — DX: Age-related osteoporosis without current pathological fracture: M81.0

## 2014-01-04 LAB — BASIC METABOLIC PANEL
Anion gap: 12 (ref 5–15)
BUN: 16 mg/dL (ref 6–23)
CO2: 29 mEq/L (ref 19–32)
Calcium: 9.6 mg/dL (ref 8.4–10.5)
Chloride: 97 mEq/L (ref 96–112)
Creatinine, Ser: 0.5 mg/dL (ref 0.50–1.10)
Glucose, Bld: 109 mg/dL — ABNORMAL HIGH (ref 70–99)
POTASSIUM: 4.3 meq/L (ref 3.7–5.3)
Sodium: 138 mEq/L (ref 137–147)

## 2014-01-04 LAB — CBC
HCT: 41.1 % (ref 36.0–46.0)
Hemoglobin: 13.4 g/dL (ref 12.0–15.0)
MCH: 28 pg (ref 26.0–34.0)
MCHC: 32.6 g/dL (ref 30.0–36.0)
MCV: 86 fL (ref 78.0–100.0)
PLATELETS: 401 10*3/uL — AB (ref 150–400)
RBC: 4.78 MIL/uL (ref 3.87–5.11)
RDW: 14 % (ref 11.5–15.5)
WBC: 8.9 10*3/uL (ref 4.0–10.5)

## 2014-01-04 NOTE — Progress Notes (Signed)
Call to [employer]. PA-C, reviewed pt. 's need for EKG, pt. Thought she had one when she was here earlier this year, none found.  Will do DOS.  No need to repeat CXR- pt. Without any ,all respiratory symptoms today.

## 2014-01-10 MED ORDER — CEFAZOLIN SODIUM-DEXTROSE 2-3 GM-% IV SOLR
2.0000 g | INTRAVENOUS | Status: AC
  Administered 2014-01-11: 2 g via INTRAVENOUS
  Filled 2014-01-10: qty 50

## 2014-01-11 ENCOUNTER — Observation Stay (HOSPITAL_COMMUNITY)
Admission: RE | Admit: 2014-01-11 | Discharge: 2014-01-14 | Disposition: A | Discharge status: Home / Self Care | Payer: Medicare HMO | Source: Ambulatory Visit | Attending: Neurological Surgery | Admitting: Neurological Surgery

## 2014-01-11 ENCOUNTER — Inpatient Hospital Stay (HOSPITAL_COMMUNITY): Payer: Medicare HMO | Admitting: Vascular Surgery

## 2014-01-11 ENCOUNTER — Encounter (HOSPITAL_COMMUNITY)
Admission: RE | Disposition: A | Discharge status: Home / Self Care | Payer: Self-pay | Source: Ambulatory Visit | Attending: Neurological Surgery

## 2014-01-11 ENCOUNTER — Encounter (HOSPITAL_COMMUNITY): Payer: Self-pay | Admitting: *Deleted

## 2014-01-11 ENCOUNTER — Inpatient Hospital Stay (HOSPITAL_COMMUNITY): Payer: Medicare HMO | Admitting: Certified Registered"

## 2014-01-11 DIAGNOSIS — M459 Ankylosing spondylitis of unspecified sites in spine: Secondary | ICD-10-CM | POA: Insufficient documentation

## 2014-01-11 DIAGNOSIS — Z885 Allergy status to narcotic agent status: Secondary | ICD-10-CM | POA: Insufficient documentation

## 2014-01-11 DIAGNOSIS — M810 Age-related osteoporosis without current pathological fracture: Secondary | ICD-10-CM | POA: Insufficient documentation

## 2014-01-11 DIAGNOSIS — Z79899 Other long term (current) drug therapy: Secondary | ICD-10-CM | POA: Diagnosis not present

## 2014-01-11 DIAGNOSIS — F419 Anxiety disorder, unspecified: Secondary | ICD-10-CM | POA: Insufficient documentation

## 2014-01-11 DIAGNOSIS — S065XAA Traumatic subdural hemorrhage with loss of consciousness status unknown, initial encounter: Secondary | ICD-10-CM | POA: Diagnosis present

## 2014-01-11 DIAGNOSIS — Z4821 Encounter for aftercare following heart transplant: Secondary | ICD-10-CM | POA: Diagnosis not present

## 2014-01-11 DIAGNOSIS — S065X9A Traumatic subdural hemorrhage with loss of consciousness of unspecified duration, initial encounter: Secondary | ICD-10-CM | POA: Diagnosis present

## 2014-01-11 DIAGNOSIS — Z481 Encounter for planned postprocedural wound closure: Secondary | ICD-10-CM | POA: Diagnosis present

## 2014-01-11 HISTORY — PX: CRANIOPLASTY: SHX1407

## 2014-01-11 SURGERY — CRANIOPLASTY
Anesthesia: General

## 2014-01-11 MED ORDER — BISACODYL 10 MG RE SUPP: 10.0000 mg | Freq: Every day | RECTAL | Status: DC | PRN

## 2014-01-11 MED ORDER — GABAPENTIN 100 MG PO CAPS
100.0000 mg | ORAL_CAPSULE | Freq: Three times a day (TID) | ORAL | Status: DC
  Administered 2014-01-11 – 2014-01-14 (×4): 100 mg via ORAL
  Filled 2014-01-11 (×4): qty 1

## 2014-01-11 MED ORDER — TAMSULOSIN HCL 0.4 MG PO CAPS
0.4000 mg | ORAL_CAPSULE | Freq: Every day | ORAL | Status: DC
  Filled 2014-01-11 (×2): qty 1

## 2014-01-11 MED ORDER — OXYCODONE HCL 5 MG PO TABS
10.0000 mg | ORAL_TABLET | ORAL | Status: DC | PRN
  Administered 2014-01-11 – 2014-01-14 (×4): 10 mg via ORAL
  Filled 2014-01-11 (×4): qty 2

## 2014-01-11 MED ORDER — ROCURONIUM BROMIDE 100 MG/10ML IV SOLN
INTRAVENOUS | Status: DC | PRN
  Administered 2014-01-11: 35 mg via INTRAVENOUS

## 2014-01-11 MED ORDER — MAGNESIUM OXIDE 400 (241.3 MG) MG PO TABS
400.0000 mg | ORAL_TABLET | Freq: Every day | ORAL | Status: DC
  Administered 2014-01-12 – 2014-01-14 (×3): 400 mg via ORAL
  Filled 2014-01-11 (×3): qty 1

## 2014-01-11 MED ORDER — BISMUTH SUBSALICYLATE 262 MG/15ML PO SUSP
30.0000 mL | Freq: Four times a day (QID) | ORAL | Status: DC | PRN
  Filled 2014-01-11: qty 236

## 2014-01-11 MED ORDER — LIDOCAINE-EPINEPHRINE 1 %-1:100000 IJ SOLN
INTRAMUSCULAR | Status: DC | PRN
  Administered 2014-01-11: 5 mL

## 2014-01-11 MED ORDER — LABETALOL HCL 5 MG/ML IV SOLN
INTRAVENOUS | Status: DC | PRN
  Administered 2014-01-11 (×2): 5 mg via INTRAVENOUS
  Administered 2014-01-11: 10 mg via INTRAVENOUS

## 2014-01-11 MED ORDER — PROPOFOL 10 MG/ML IV BOLUS
INTRAVENOUS | Status: AC
  Filled 2014-01-11: qty 20

## 2014-01-11 MED ORDER — SURGIFOAM 100 EX MISC
CUTANEOUS | Status: DC | PRN
  Administered 2014-01-11: 14:00:00 via TOPICAL

## 2014-01-11 MED ORDER — PANTOPRAZOLE SODIUM 40 MG IV SOLR: 40.0000 mg | Freq: Every day | INTRAVENOUS | Status: DC

## 2014-01-11 MED ORDER — BUPIVACAINE HCL (PF) 0.25 % IJ SOLN
INTRAMUSCULAR | Status: DC | PRN
  Administered 2014-01-11: 5 mL

## 2014-01-11 MED ORDER — EPHEDRINE SULFATE 50 MG/ML IJ SOLN
INTRAMUSCULAR | Status: DC | PRN
  Administered 2014-01-11: 5 mg via INTRAVENOUS

## 2014-01-11 MED ORDER — ALPRAZOLAM 0.25 MG PO TABS
0.2500 mg | ORAL_TABLET | Freq: Two times a day (BID) | ORAL | Status: DC
  Administered 2014-01-12: 0.25 mg via ORAL
  Filled 2014-01-11: qty 1

## 2014-01-11 MED ORDER — LABETALOL HCL 5 MG/ML IV SOLN
INTRAVENOUS | Status: AC
  Filled 2014-01-11: qty 4

## 2014-01-11 MED ORDER — LABETALOL HCL 5 MG/ML IV SOLN
10.0000 mg | INTRAVENOUS | Status: DC | PRN
  Administered 2014-01-11: 20 mg via INTRAVENOUS

## 2014-01-11 MED ORDER — FENTANYL CITRATE 0.05 MG/ML IJ SOLN
25.0000 ug | INTRAMUSCULAR | Status: DC | PRN
  Administered 2014-01-11 (×2): 50 ug via INTRAVENOUS

## 2014-01-11 MED ORDER — TRAZODONE HCL 50 MG PO TABS: 25.0000 mg | ORAL_TABLET | Freq: Every evening | ORAL | Status: DC | PRN

## 2014-01-11 MED ORDER — DIPHENHYDRAMINE HCL 50 MG/ML IJ SOLN: 10.0000 mg | Freq: Once | INTRAMUSCULAR | Status: DC

## 2014-01-11 MED ORDER — FENTANYL CITRATE 0.05 MG/ML IJ SOLN
INTRAMUSCULAR | Status: AC
Start: 2014-01-11 — End: 2014-01-11
  Filled 2014-01-11: qty 5

## 2014-01-11 MED ORDER — ACETAMINOPHEN 500 MG PO TABS
500.0000 mg | ORAL_TABLET | Freq: Four times a day (QID) | ORAL | Status: DC | PRN
  Administered 2014-01-11 – 2014-01-14 (×3): 1000 mg via ORAL
  Administered 2014-01-14: 500 mg via ORAL
  Filled 2014-01-11 (×4): qty 2
  Filled 2014-01-11: qty 1
  Filled 2014-01-11: qty 2

## 2014-01-11 MED ORDER — LIDOCAINE HCL (CARDIAC) 20 MG/ML IV SOLN
INTRAVENOUS | Status: AC
  Filled 2014-01-11: qty 5

## 2014-01-11 MED ORDER — ONDANSETRON HCL 4 MG PO TABS: 4.0000 mg | ORAL_TABLET | ORAL | Status: DC | PRN

## 2014-01-11 MED ORDER — ONDANSETRON HCL 4 MG/2ML IJ SOLN: 4.0000 mg | INTRAMUSCULAR | Status: DC | PRN

## 2014-01-11 MED ORDER — ONDANSETRON HCL 4 MG/2ML IJ SOLN
INTRAMUSCULAR | Status: DC | PRN
  Administered 2014-01-11: 4 mg via INTRAVENOUS

## 2014-01-11 MED ORDER — TRAMADOL HCL 50 MG PO TABS
50.0000 mg | ORAL_TABLET | Freq: Four times a day (QID) | ORAL | Status: DC
  Administered 2014-01-11 – 2014-01-14 (×12): 50 mg via ORAL
  Filled 2014-01-11 (×12): qty 1

## 2014-01-11 MED ORDER — PHENYLEPHRINE 40 MCG/ML (10ML) SYRINGE FOR IV PUSH (FOR BLOOD PRESSURE SUPPORT)
PREFILLED_SYRINGE | INTRAVENOUS | Status: AC
  Filled 2014-01-11: qty 10

## 2014-01-11 MED ORDER — METOPROLOL TARTRATE 12.5 MG HALF TABLET
12.5000 mg | ORAL_TABLET | Freq: Two times a day (BID) | ORAL | Status: DC
  Administered 2014-01-11 – 2014-01-14 (×6): 12.5 mg via ORAL
  Filled 2014-01-11 (×6): qty 1

## 2014-01-11 MED ORDER — SCOPOLAMINE 1 MG/3DAYS TD PT72
MEDICATED_PATCH | TRANSDERMAL | Status: AC
  Administered 2014-01-11: 1 via TRANSDERMAL
  Filled 2014-01-11: qty 1

## 2014-01-11 MED ORDER — 0.9 % SODIUM CHLORIDE (POUR BTL) OPTIME
TOPICAL | Status: DC | PRN
  Administered 2014-01-11 (×2): 1000 mL

## 2014-01-11 MED ORDER — DOCUSATE SODIUM 100 MG PO CAPS
100.0000 mg | ORAL_CAPSULE | Freq: Two times a day (BID) | ORAL | Status: DC | PRN
  Administered 2014-01-11 – 2014-01-12 (×2): 100 mg via ORAL
  Filled 2014-01-11 (×2): qty 1

## 2014-01-11 MED ORDER — PROMETHAZINE HCL 25 MG PO TABS: 12.5000 mg | ORAL_TABLET | ORAL | Status: DC | PRN

## 2014-01-11 MED ORDER — LACTATED RINGERS IV SOLN
INTRAVENOUS | Status: DC
  Administered 2014-01-11: 13:00:00 via INTRAVENOUS

## 2014-01-11 MED ORDER — FENTANYL CITRATE 0.05 MG/ML IJ SOLN
INTRAMUSCULAR | Status: DC | PRN
  Administered 2014-01-11 (×2): 50 ug via INTRAVENOUS
  Administered 2014-01-11: 100 ug via INTRAVENOUS
  Administered 2014-01-11: 50 ug via INTRAVENOUS

## 2014-01-11 MED ORDER — BETHANECHOL CHLORIDE 25 MG PO TABS
25.0000 mg | ORAL_TABLET | Freq: Four times a day (QID) | ORAL | Status: DC
  Administered 2014-01-12 (×3): 25 mg via ORAL
  Filled 2014-01-11 (×4): qty 1

## 2014-01-11 MED ORDER — GLYCOPYRROLATE 0.2 MG/ML IJ SOLN
INTRAMUSCULAR | Status: DC | PRN
  Administered 2014-01-11: .4 mg via INTRAVENOUS

## 2014-01-11 MED ORDER — PROPOFOL 10 MG/ML IV BOLUS
INTRAVENOUS | Status: DC | PRN
  Administered 2014-01-11: 130 mg via INTRAVENOUS

## 2014-01-11 MED ORDER — LACTATED RINGERS IV SOLN
INTRAVENOUS | Status: DC | PRN
  Administered 2014-01-11 (×2): via INTRAVENOUS

## 2014-01-11 MED ORDER — METHOCARBAMOL 500 MG PO TABS: 500.0000 mg | ORAL_TABLET | Freq: Four times a day (QID) | ORAL | Status: DC | PRN

## 2014-01-11 MED ORDER — LIDOCAINE HCL (CARDIAC) 20 MG/ML IV SOLN
INTRAVENOUS | Status: DC | PRN
  Administered 2014-01-11: 20 mg via INTRAVENOUS

## 2014-01-11 MED ORDER — FENTANYL CITRATE 0.05 MG/ML IJ SOLN
INTRAMUSCULAR | Status: AC
  Filled 2014-01-11: qty 2

## 2014-01-11 MED ORDER — SENNOSIDES-DOCUSATE SODIUM 8.6-50 MG PO TABS
2.0000 | ORAL_TABLET | Freq: Three times a day (TID) | ORAL | Status: DC | PRN
  Administered 2014-01-12 – 2014-01-14 (×4): 2 via ORAL
  Filled 2014-01-11 (×4): qty 2

## 2014-01-11 MED ORDER — NEOSTIGMINE METHYLSULFATE 10 MG/10ML IV SOLN
INTRAVENOUS | Status: DC | PRN
Start: 2014-01-11 — End: 2014-01-11
  Administered 2014-01-11: 3 mg via INTRAVENOUS

## 2014-01-11 MED ORDER — SODIUM CHLORIDE 0.9 % IR SOLN
Status: DC | PRN
  Administered 2014-01-11: 14:00:00

## 2014-01-11 MED ORDER — PHENYLEPHRINE HCL 10 MG/ML IJ SOLN
INTRAMUSCULAR | Status: DC | PRN
  Administered 2014-01-11: 40 ug via INTRAVENOUS

## 2014-01-11 SURGICAL SUPPLY — 77 items
ADH SKN CLS LQ APL DERMABOND (GAUZE/BANDAGES/DRESSINGS) ×1
BAG DECANTER FOR FLEXI CONT (MISCELLANEOUS) ×3 IMPLANT
BIT DRILL WIRE PASS 1.3MM (BIT) IMPLANT
BNDG GAUZE ELAST 4 BULKY (GAUZE/BANDAGES/DRESSINGS) ×3 IMPLANT
BRUSH SCRUB EZ PLAIN DRY (MISCELLANEOUS) ×3 IMPLANT
BUR ACORN 6.0 (BURR) ×2 IMPLANT
BUR ACORN 6.0MM (BURR) ×1
BUR ADDG 1.1 (BURR) IMPLANT
BUR ADDG 1.1MM (BURR)
BUR ROUTER D-58 CRANI (BURR) IMPLANT
CANISTER SUCT 3000ML (MISCELLANEOUS) ×3 IMPLANT
CLIP TI MEDIUM 6 (CLIP) IMPLANT
CONT SPEC 4OZ CLIKSEAL STRL BL (MISCELLANEOUS) ×3 IMPLANT
CORDS BIPOLAR (ELECTRODE) ×3 IMPLANT
DECANTER SPIKE VIAL GLASS SM (MISCELLANEOUS) ×3 IMPLANT
DERMABOND ADHESIVE PROPEN (GAUZE/BANDAGES/DRESSINGS) ×2
DERMABOND ADVANCED .7 DNX6 (GAUZE/BANDAGES/DRESSINGS) IMPLANT
DRAIN CHANNEL 10M FLAT 3/4 FLT (DRAIN) IMPLANT
DRAIN PENROSE 1/2X12 LTX STRL (WOUND CARE) IMPLANT
DRAPE SURG IRRIG POUCH 19X23 (DRAPES) IMPLANT
DRAPE WARM FLUID 44X44 (DRAPE) ×3 IMPLANT
DRILL WIRE PASS 1.3MM (BIT)
DRSG ADAPTIC 3X8 NADH LF (GAUZE/BANDAGES/DRESSINGS) IMPLANT
DRSG OPSITE POSTOP 4X8 (GAUZE/BANDAGES/DRESSINGS) ×2 IMPLANT
DRSG PAD ABDOMINAL 8X10 ST (GAUZE/BANDAGES/DRESSINGS) IMPLANT
DURAPREP 6ML APPLICATOR 50/CS (WOUND CARE) ×3 IMPLANT
ELECT CAUTERY BLADE 6.4 (BLADE) ×3 IMPLANT
ELECT REM PT RETURN 9FT ADLT (ELECTROSURGICAL) ×3
ELECTRODE REM PT RTRN 9FT ADLT (ELECTROSURGICAL) ×1 IMPLANT
EVACUATOR SILICONE 100CC (DRAIN) IMPLANT
GAUZE SPONGE 4X4 12PLY STRL (GAUZE/BANDAGES/DRESSINGS) IMPLANT
GAUZE SPONGE 4X4 16PLY XRAY LF (GAUZE/BANDAGES/DRESSINGS) IMPLANT
GLOVE BIO SURGEON STRL SZ7.5 (GLOVE) IMPLANT
GLOVE BIOGEL PI IND STRL 7.5 (GLOVE) IMPLANT
GLOVE BIOGEL PI IND STRL 8.5 (GLOVE) ×1 IMPLANT
GLOVE BIOGEL PI INDICATOR 7.5 (GLOVE)
GLOVE BIOGEL PI INDICATOR 8.5 (GLOVE) ×2
GLOVE ECLIPSE 8.5 STRL (GLOVE) ×3 IMPLANT
GLOVE EXAM NITRILE LRG STRL (GLOVE) IMPLANT
GLOVE EXAM NITRILE MD LF STRL (GLOVE) IMPLANT
GLOVE EXAM NITRILE XL STR (GLOVE) IMPLANT
GLOVE EXAM NITRILE XS STR PU (GLOVE) IMPLANT
GOWN STRL REUS W/ TWL LRG LVL3 (GOWN DISPOSABLE) IMPLANT
GOWN STRL REUS W/ TWL XL LVL3 (GOWN DISPOSABLE) ×1 IMPLANT
GOWN STRL REUS W/TWL 2XL LVL3 (GOWN DISPOSABLE) ×3 IMPLANT
GOWN STRL REUS W/TWL LRG LVL3 (GOWN DISPOSABLE)
GOWN STRL REUS W/TWL XL LVL3 (GOWN DISPOSABLE) ×3
HEMOSTAT SURGICEL 2X14 (HEMOSTASIS) IMPLANT
HOOK DURA (MISCELLANEOUS) ×3 IMPLANT
KIT BASIN OR (CUSTOM PROCEDURE TRAY) ×3 IMPLANT
KIT ROOM TURNOVER OR (KITS) ×3 IMPLANT
NEEDLE HYPO 22GX1.5 SAFETY (NEEDLE) ×3 IMPLANT
NS IRRIG 1000ML POUR BTL (IV SOLUTION) ×3 IMPLANT
PACK CRANIOTOMY (CUSTOM PROCEDURE TRAY) ×3 IMPLANT
PATTIES SURGICAL .5 X.5 (GAUZE/BANDAGES/DRESSINGS) IMPLANT
PATTIES SURGICAL .5 X3 (DISPOSABLE) IMPLANT
PATTIES SURGICAL 1X1 (DISPOSABLE) IMPLANT
PIN MAYFIELD SKULL DISP (PIN) IMPLANT
PLATE 1.5  2HOLE MED NEURO (Plate) ×8 IMPLANT
PLATE 1.5 2HOLE MED NEURO (Plate) IMPLANT
SCREW SELF DRILL HT 1.5/4MM (Screw) ×16 IMPLANT
SET CRAINOPLASTY (SET/KITS/TRAYS/PACK) ×3 IMPLANT
SPECIMEN JAR SMALL (MISCELLANEOUS) IMPLANT
SPONGE NEURO XRAY DETECT 1X3 (DISPOSABLE) IMPLANT
SPONGE SURGIFOAM ABS GEL 100 (HEMOSTASIS) IMPLANT
STAPLER SKIN PROX WIDE 3.9 (STAPLE) ×3 IMPLANT
SUT ETHILON 3 0 FSL (SUTURE) IMPLANT
SUT NURALON 4 0 TR CR/8 (SUTURE) ×6 IMPLANT
SUT VIC AB 2-0 CP2 18 (SUTURE) ×6 IMPLANT
SYR 20ML ECCENTRIC (SYRINGE) ×3 IMPLANT
SYR CONTROL 10ML LL (SYRINGE) ×3 IMPLANT
TOWEL OR 17X24 6PK STRL BLUE (TOWEL DISPOSABLE) ×3 IMPLANT
TOWEL OR 17X26 10 PK STRL BLUE (TOWEL DISPOSABLE) ×3 IMPLANT
TRAP SPECIMEN MUCOUS 40CC (MISCELLANEOUS) IMPLANT
TRAY FOLEY CATH 14FRSI W/METER (CATHETERS) IMPLANT
UNDERPAD 30X30 INCONTINENT (UNDERPADS AND DIAPERS) IMPLANT
WATER STERILE IRR 1000ML POUR (IV SOLUTION) ×3 IMPLANT

## 2014-01-11 NOTE — H&P (Signed)
Erica [name] is an 80 y.o. female.   Chief Complaint: cranial defect after subdural hematoma with surgical craniectomy. HPI: patient is a 80 year old individual who's had a craniectomy for a large acute subdural hematoma. She tolerated the surgery well and gradually recovered from her subdural. The patient had the bone flap placed into the abdomen initially and this is now been healed up quite well. She desires to have a cranioplasty.  Past Medical History  Diagnosis Date  . Spondylitis, ankylosing   . Anxiety     h/o curious anxiety   . Arthritis     osteoporosis, ankylosing spondylosis   . Cancer     basal cell- facial   . History of blood transfusion     for back surgery  . Osteoporosis     Past Surgical History  Procedure Laterality Date  . Abdominal hysterectomy    . Hardware removal N/A 10/23/2013    Procedure: HARDWARE REMOVAL;  Surgeon: Kristeen Miss, MD;  Location: [facility];  Service: Neurosurgery;  Laterality: N/A;  HARDWARE REMOVAL  . Craniotomy Right 10/06/2013    Procedure: CRANIECTOMY HEMATOMA EVACUATION SUBDURAL, PLACEMENT OF SKULL FLAP IN ABDOMEN;  Surgeon: Kristeen Miss, MD;  Location: [place] [facility];  Service: Neurosurgery;  Laterality: Right;  . Posterior lumbar fusion N/A 10/06/2013    Procedure: POSTERIOR LUMBAR FUSION  lumbar one/two;  Surgeon: Kristeen Miss, MD;  Location: [place] [facility];  Service: Neurosurgery;  Laterality: N/A;    History reviewed. No pertinent family history. Social History:  reports that she has never smoked. She does not have any smokeless tobacco history on file. She reports that she does not drink alcohol or use illicit drugs.  Allergies:  Allergies  Allergen Reactions  . Codeine Nausea Only    Medications Prior to Admission  Medication Sig Dispense Refill  . acetaminophen (TYLENOL) 500 MG tablet Take 500-1,000 mg by mouth every 6 (six) hours as needed for mild pain or moderate pain.    Marland Kitchen ALPRAZolam (XANAX) 0.25 MG tablet Take 1  tablet (0.25 mg total) by mouth 2 (two) times daily. 30 tablet 0  . amoxicillin (AMOXIL) 500 MG capsule Take 1 capsule (500 mg total) by mouth every 8 (eight) hours.    . Ascorbic Acid (VITAMIN C PO) Take 1,000 mg by mouth 3 (three) times daily.     Marland Kitchen bismuth subsalicylate (PEPTO BISMOL) 262 MG/15ML suspension Take 30 mLs by mouth every 6 (six) hours as needed for indigestion or diarrhea or loose stools.    . Calcium Carb-Cholecalciferol (CALCIUM 600 + D PO) Take 1 tablet by mouth daily.    . Cholecalciferol (VITAMIN D-3) 5000 UNITS TABS Take 1 tablet by mouth daily.    . diclofenac sodium (VOLTAREN) 1 % GEL Apply 1 application topically daily as needed (uses if nio relief from tramadol and tylenol).    Marland Kitchen lidocaine (LIDODERM) 5 % Place 2 patches onto the skin daily. Remove & Discard patch within 12 hours or as directed by MD (Patient taking differently: Place 1 patch onto the skin daily. Remove & Discard patch within 12 hours or as directed by MD) 30 patch 0  . magnesium oxide (MAG-OX) 400 (241.3 MG) MG tablet Take 0.5 tablets (200 mg total) by mouth daily. (Patient taking differently: Take 400 mg by mouth daily. )    . Melatonin 5 MG TABS Take 1 tablet by mouth at bedtime as needed.    . metoprolol tartrate (LOPRESSOR) 25 MG tablet Take 12.5 mg by  mouth 2 (two) times daily.    Marland Kitchen OVER THE COUNTER MEDICATION Take 1 tablet by mouth 2 (two) times daily. Hydraflexin    . OVER THE COUNTER MEDICATION Take 1 tablet by mouth 2 (two) times daily. Ultimate Bone Support    . OVER THE COUNTER MEDICATION Take 1 capsule by mouth daily. Amazing Grape    . OVER THE COUNTER MEDICATION Take 1 tablet by mouth 3 (three) times daily. Osteo Bone    . OVER THE COUNTER MEDICATION Take 2 capsules by mouth 2 (two) times daily. PARAMIN    . OVER THE COUNTER MEDICATION Take 1 tablet by mouth 3 (three) times daily. Joint performance plus    . OVER THE COUNTER MEDICATION Take 1 tablet by mouth 3 (three) times daily. Thytrophine  PMG    . OVER THE COUNTER MEDICATION Take 1 tablet by mouth 3 (three) times daily. Calcifood    . oxyCODONE (OXY IR/ROXICODONE) 5 MG immediate release tablet Take 2 tablets (10 mg total) by mouth every 4 (four) hours as needed for severe pain. 30 tablet 0  . oxyCODONE-acetaminophen (PERCOCET/ROXICET) 5-325 MG per tablet Take 1-2 tablets by mouth every 4 (four) hours as needed for moderate pain. (Patient taking differently: Take 0.5 tablets by mouth every 4 (four) hours as needed for moderate pain. ) 60 tablet 0  . polyethylene glycol (MIRALAX / GLYCOLAX) packet Take 17 g by mouth daily as needed.    . Probiotic Product (ADVANCED PROBIOTIC PO) Take 1 capsule by mouth daily.    . traMADol (ULTRAM) 50 MG tablet Take 1 tablet (50 mg total) by mouth every 6 (six) hours. 30 tablet   . TURMERIC PO Take 1 capsule by mouth 2 (two) times daily. With garlic and cayenne pepper    . bethanechol (URECHOLINE) 25 MG tablet Take 1 tablet (25 mg total) by mouth 4 (four) times daily. (Patient not taking: Reported on 01/04/2014)    . bisacodyl (DULCOLAX) 10 MG suppository Place 1 suppository (10 mg total) rectally daily as needed for moderate constipation. (Patient not taking: Reported on 01/04/2014) 12 suppository 0  . gabapentin (NEURONTIN) 100 MG capsule Take 1 capsule (100 mg total) by mouth 3 (three) times daily. (Patient not taking: Reported on 01/04/2014)    . methocarbamol (ROBAXIN) 500 MG tablet Take 1 tablet (500 mg total) by mouth every 6 (six) hours. (Patient not taking: Reported on 01/04/2014)    . metoprolol tartrate (LOPRESSOR) 25 mg/10 mL SUSP Take 5 mLs (12.5 mg total) by mouth 2 (two) times daily. (Patient not taking: Reported on 01/04/2014)    . senna-docusate (SENOKOT-S) 8.6-50 MG per tablet Take 2 tablets by mouth 3 (three) times daily before meals. (Patient not taking: Reported on 01/04/2014)    . tamsulosin (FLOMAX) 0.4 MG CAPS capsule Take 1 capsule (0.4 mg total) by mouth daily after supper. (Patient  not taking: Reported on 01/04/2014) 30 capsule   . traZODone (DESYREL) 25 mg TABS tablet Take 0.5-1 tablets (25-50 mg total) by mouth at bedtime as needed for sleep. (Patient not taking: Reported on 01/04/2014)      No results found for this or any previous visit (from the past 48 hour(s)). No results found.  Review of Systems  HENT: Negative.   Eyes: Negative.   Respiratory: Negative.   Cardiovascular: Negative.   Gastrointestinal: Negative.   Genitourinary: Negative.   Musculoskeletal: Positive for back pain.       History of ankylosing spondylitis  Skin: Negative.   Neurological: Positive  for weakness.  Endo/Heme/Allergies: Negative.   Psychiatric/Behavioral: Negative.     Blood pressure 170/69, pulse 76, temperature 98.9 F (37.2 C), temperature source Oral, resp. rate 20, height 4\' 11"  (1.499 m), weight 54.885 kg (121 lb), SpO2 99 %. Physical Exam  Constitutional: She is oriented to person, place, and time. She appears well-developed and well-nourished.  HENT:  Right frontal skull defect  Eyes: Conjunctivae are normal. Pupils are equal, round, and reactive to light.  Neck: Normal range of motion. Neck supple.  Cardiovascular: Normal rate and regular rhythm.   Respiratory: Effort normal and breath sounds normal.  GI: Soft. Bowel sounds are normal.  Musculoskeletal:  Rigid lumbar thoracic and cervical spine secondary to ankylosing spondylitis  Neurological: She is alert and oriented to person, place, and time. She has normal reflexes.  Skin: Skin is warm and dry.  Psychiatric: She has a normal mood and affect. Her behavior is normal. Judgment and thought content normal.     Assessment/Plan Radial defect right frontal region. Cranioplasty with bone flap that was placed in abdominal cavity.  Erica [name] 01/11/2014, 1:48 PM

## 2014-01-11 NOTE — Anesthesia Procedure Notes (Signed)
Procedure Name: Intubation Date/Time: 01/11/2014 2:21 PM Performed by: Gaylene Brooks Pre-anesthesia Checklist: Patient identified, Timeout performed, Emergency Drugs available, Suction available and Patient being monitored Patient Re-evaluated:Patient Re-evaluated prior to inductionOxygen Delivery Method: Circle system utilized Preoxygenation: Pre-oxygenation with 100% oxygen Intubation Type: IV induction Ventilation: Mask ventilation without difficulty Tube type: Oral Tube size: 7.0 mm Number of attempts: 1 Airway Equipment and Method: Video-laryngoscopy Placement Confirmation: ETT inserted through vocal cords under direct vision,  breath sounds checked- equal and bilateral,  positive ETCO2 and CO2 detector Secured at: 21 cm Tube secured with: Tape Dental Injury: Teeth and Oropharynx as per pre-operative assessment  Difficulty Due To: Difficult Airway- due to reduced neck mobility Comments: No neck extension. Easy glidescope intubation.

## 2014-01-11 NOTE — Progress Notes (Signed)
Pt arrived via stretcher from PACU @ 16:45pm alert, verbal with no noted distress. Pt ambulated to the bathroom with assist x1 with a walker then put to be for assessment. Noted clean dry gauze dressing to head and dermabond to abdomen with no drainage. Pt complaint of discomfort administered Tramadol per routine order. Repositioned in bed. Family at bedside. Pt educated and oriented to room. Safety measures in place. Call bell within reach.. Pt and family refused all routine medication except for Tramadol stating that pt only needed Tramadol, Oxycodone, and something to move her bowels. Pt and family educated on importance of medication ordered routine but they continued to refuse. Pt daughter stated that she takes Tramadol daily 4 times a day and that works for her. Will continue to monitor.

## 2014-01-11 NOTE — Progress Notes (Signed)
Patient ID: Erica [name], female   DOB: November 13, 1933, 80 y.o.   MRN: [id number] Alert awake and doing well postop in recovery room
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
## 2014-01-11 NOTE — Transfer of Care (Signed)
Immediate Anesthesia Transfer of Care Note  Patient: Erica [name]  Procedure(s) Performed: Procedure(s): CRANIOPLASTY FROM FLAP IN ABDOMEN (N/A)  Patient Location: PACU  Anesthesia Type:General  Level of Consciousness: awake, alert  and oriented  Airway & Oxygen Therapy: Patient Spontanous Breathing  Post-op Assessment: Report given to PACU RN  Post vital signs: Reviewed and stable  Complications: No apparent anesthesia complications

## 2014-01-11 NOTE — Anesthesia Postprocedure Evaluation (Signed)
  Anesthesia Post-op Note  Patient: Erica [name]  Procedure(s) Performed: Procedure(s): CRANIOPLASTY FROM FLAP IN ABDOMEN (N/A)  Patient Location: PACU  Anesthesia Type:General  Level of Consciousness: awake, alert , oriented and patient cooperative  Airway and Oxygen Therapy: Patient Spontanous Breathing and Patient connected to nasal cannula oxygen  Post-op Pain: mild  Post-op Assessment: Post-op Vital signs reviewed, Patient's Cardiovascular Status Stable, Respiratory Function Stable, Patent Airway, No signs of Nausea or vomiting and Pain level controlled  Post-op Vital Signs: Reviewed and stable  Last Vitals:  Filed Vitals:   01/11/14 1626  BP:   Pulse: 75  Temp: 36.6 C  Resp: 16    Complications: No apparent anesthesia complications

## 2014-01-11 NOTE — Op Note (Signed)
Date of surgery: 01/11/2014 Preoperative diagnosis: Right frontal cranial defect status post craniectomy for subdural hematoma. Postoperative diagnosis: Right frontal cranial defect status post craniectomy for subdural hematoma Procedure: Cranioplasty with bone flap from subcutaneous pouch and abdomen Surgeon: Kristeen Miss Asst.: Earle Gell M.D. Anesthesia: Gen. Endotracheal Indications: Erica [name] is a 80 year old individual who in August of this year underwent craniotomy for an acute subdural hematoma. Because of the concerns of cerebral swelling the bone flap was placed into the subcutaneous region in the right upper quadrant. She is now to have a cranioplasty with Bone flap.  Procedure: The patient was brought to the operating room and placed on the table in the supine position. After the smooth induction of general endotracheal anesthesia the right frontal scalp was shaved and prepped with alcohol and DuraPrep in the region of the craniectomy. The right upper quadrant was similarly prepped with alcohol and DuraPrep. The old incision in the right upper quadrant was open and the bone flap was removed. That incision was closed with 2-0 Vicryl in the subcutaneous tissues and 3-0 Vicryl subcuticularly. Dermabond was placed on the skin.  The cranial incision was then opened carefully dissecting the subgaleal tissues from the dural closure. This was done without incident. The bony edges were then cleared. The bone flap was placed in its approximate position. For 2 hole plates were then placed around the perimetry of the bone flap and this secured the bone flap to the surrounding cranium. Area was checked for appropriate fit. The galea was then closed with 2-0 Vicryl interrupted fashion and surgical staples were placed in the scalp. Blood loss for the procedure was estimated at 25 mL. The patient tolerated procedure well. A dry sterile dressing was applied to the scalp.

## 2014-01-12 ENCOUNTER — Encounter (HOSPITAL_COMMUNITY): Payer: Self-pay | Admitting: Neurological Surgery

## 2014-01-12 DIAGNOSIS — Z4821 Encounter for aftercare following heart transplant: Secondary | ICD-10-CM | POA: Diagnosis not present

## 2014-01-12 NOTE — Progress Notes (Addendum)
Patient ID: Erica [name], female   DOB: Jul 02, 1933, 80 y.o.   MRN: [id number] Vital signs are stable. Patient complains of abdominal distention and is concerned that her abdomen is inflamed. On examination of that she is tympanic over the bulk of her abdomen. Her incision is clean and dry in the right upper quadrant. The scalp dressing appears intact. Is minimal drainage on the scalp dressing.  Patient's postoperative status appears stable. Reassured her that abdominal distention is related to gas and not inflammation or any infectious process. We'll continue to watch her conservatively tonight.  Encouraged ambulation.  Hopeful for discharge soon.

## 2014-01-12 NOTE — Progress Notes (Addendum)
Patient c/o increasing ab pain at R side. Abdomen becoming distended and firm- some tenderness present. Bowel sounds remain active. Office staff instructed to call in surgery. Called surgical suite and reported changes. No new orders at this time.

## 2014-01-13 DIAGNOSIS — Z4821 Encounter for aftercare following heart transplant: Secondary | ICD-10-CM | POA: Diagnosis not present

## 2014-01-13 MED ORDER — PNEUMOCOCCAL VAC POLYVALENT 25 MCG/0.5ML IJ INJ: 0.5000 mL | INJECTION | INTRAMUSCULAR | Status: DC

## 2014-01-13 MED ORDER — PANTOPRAZOLE SODIUM 40 MG PO TBEC: 40.0000 mg | DELAYED_RELEASE_TABLET | Freq: Every day | ORAL | Status: DC

## 2014-01-13 NOTE — Progress Notes (Signed)
Patient ID: Erica [name], female   DOB: 08-Jun-1933, 80 y.o.   MRN: [id number] Vss.  Alert  Oriented Dressing removed form scalp, incision clean and dry. discusssed mobilization and plan to discharge in am.
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
## 2014-01-13 NOTE — Progress Notes (Signed)
UR completed 

## 2014-01-14 DIAGNOSIS — Z4821 Encounter for aftercare following heart transplant: Secondary | ICD-10-CM | POA: Diagnosis not present

## 2014-01-14 MED ORDER — TRAMADOL HCL 50 MG PO TABS: 50.0000 mg | ORAL_TABLET | Freq: Four times a day (QID) | ORAL | Status: DC | PRN

## 2014-01-14 NOTE — Discharge Summary (Signed)
Physician Discharge Summary  Patient ID: Erica [name] MRN: [id number] DOB/AGE: November 01, 1933 80 y.o.  Admit date: 01/11/2014 Discharge date: 01/14/2014  Admission Diagnoses: Craniectomy defect status post subdural hematoma  Discharge Diagnoses: Craniectomy defect status post subdural hematoma  Active Problems:   Subdural hematoma   Discharged Condition: good  Hospital Course: Patient was admitted to undergo reimplantation of her skull flap from an incision in the abdomen. She tolerated the surgery well. She is ambulatory. She lives alone and independently and required several day stay in the hospital in order to feel comfortable and confident with her return home.  Consults: None  Significant Diagnostic Studies: None  Treatments: surgery: Cranioplasty with bone flap stored in abdominal pouch  Discharge Exam: Blood pressure 152/77, pulse 92, temperature 98.2 F (36.8 C), temperature source Oral, resp. rate 20, height 4\' 11"  (1.499 m), weight 54.885 kg (121 lb), SpO2 96 %. Her incisions are clean and dry. Motor function appears intact. Cognitive function is improving steadily.  Disposition: Discharge home  Discharge Instructions    Call MD for:  redness, tenderness, or signs of infection (pain, swelling, redness, odor or green/yellow discharge around incision site)    Complete by:  As directed      Call MD for:  severe uncontrolled pain    Complete by:  As directed      Call MD for:  temperature >100.4    Complete by:  As directed      Diet - low sodium heart healthy    Complete by:  As directed      Increase activity slowly    Complete by:  As directed             Medication List    TAKE these medications        acetaminophen 500 MG tablet  Commonly known as:  TYLENOL  Take 500-1,000 mg by mouth every 6 (six) hours as needed for mild pain or moderate pain.     ADVANCED PROBIOTIC PO  Take 1 capsule by mouth daily.     ALPRAZolam 0.25 MG tablet  Commonly known as:   XANAX  Take 1 tablet (0.25 mg total) by mouth 2 (two) times daily.     amoxicillin 500 MG capsule  Commonly known as:  AMOXIL  Take 1 capsule (500 mg total) by mouth every 8 (eight) hours.     bethanechol 25 MG tablet  Commonly known as:  URECHOLINE  Take 1 tablet (25 mg total) by mouth 4 (four) times daily.     bisacodyl 10 MG suppository  Commonly known as:  DULCOLAX  Place 1 suppository (10 mg total) rectally daily as needed for moderate constipation.     bismuth subsalicylate 740 CX/44YJ suspension  Commonly known as:  PEPTO BISMOL  Take 30 mLs by mouth every 6 (six) hours as needed for indigestion or diarrhea or loose stools.     CALCIUM 600 + D PO  Take 1 tablet by mouth daily.     diclofenac sodium 1 % Gel  Commonly known as:  VOLTAREN  Apply 1 application topically daily as needed (uses if nio relief from tramadol and tylenol).     gabapentin 100 MG capsule  Commonly known as:  NEURONTIN  Take 1 capsule (100 mg total) by mouth 3 (three) times daily.     lidocaine 5 %  Commonly known as:  LIDODERM  Place 2 patches onto the skin daily. Remove & Discard patch within 12 hours or as directed  by MD     magnesium oxide 400 (241.3 MG) MG tablet  Commonly known as:  MAG-OX  Take 0.5 tablets (200 mg total) by mouth daily.     Melatonin 5 MG Tabs  Take 1 tablet by mouth at bedtime as needed.     methocarbamol 500 MG tablet  Commonly known as:  ROBAXIN  Take 1 tablet (500 mg total) by mouth every 6 (six) hours.     metoprolol tartrate 25 MG tablet  Commonly known as:  LOPRESSOR  Take 12.5 mg by mouth 2 (two) times daily.     metoprolol tartrate 25 mg/10 mL Susp  Commonly known as:  LOPRESSOR  Take 5 mLs (12.5 mg total) by mouth 2 (two) times daily.     OVER THE COUNTER MEDICATION  Take 1 tablet by mouth 2 (two) times daily. Hydraflexin     OVER THE COUNTER MEDICATION  Take 1 tablet by mouth 2 (two) times daily. Ultimate Bone Support     OVER THE COUNTER  MEDICATION  Take 1 capsule by mouth daily. Amazing Grape     OVER THE COUNTER MEDICATION  Take 1 tablet by mouth 3 (three) times daily. Osteo Bone     OVER THE COUNTER MEDICATION  Take 2 capsules by mouth 2 (two) times daily. PARAMIN     OVER THE COUNTER MEDICATION  Take 1 tablet by mouth 3 (three) times daily. Joint performance plus     OVER THE COUNTER MEDICATION  Take 1 tablet by mouth 3 (three) times daily. Thytrophine PMG     OVER THE COUNTER MEDICATION  Take 1 tablet by mouth 3 (three) times daily. Calcifood     oxyCODONE 5 MG immediate release tablet  Commonly known as:  Oxy IR/ROXICODONE  Take 2 tablets (10 mg total) by mouth every 4 (four) hours as needed for severe pain.     oxyCODONE-acetaminophen 5-325 MG per tablet  Commonly known as:  PERCOCET/ROXICET  Take 1-2 tablets by mouth every 4 (four) hours as needed for moderate pain.     polyethylene glycol packet  Commonly known as:  MIRALAX / GLYCOLAX  Take 17 g by mouth daily as needed.     senna-docusate 8.6-50 MG per tablet  Commonly known as:  Senokot-S  Take 2 tablets by mouth 3 (three) times daily before meals.     tamsulosin 0.4 MG Caps capsule  Commonly known as:  FLOMAX  Take 1 capsule (0.4 mg total) by mouth daily after supper.     traMADol 50 MG tablet  Commonly known as:  ULTRAM  Take 1 tablet (50 mg total) by mouth every 6 (six) hours.     traMADol 50 MG tablet  Commonly known as:  ULTRAM  Take 1 tablet (50 mg total) by mouth every 6 (six) hours as needed for moderate pain.     traZODone 25 mg Tabs tablet  Commonly known as:  DESYREL  Take 0.5-1 tablets (25-50 mg total) by mouth at bedtime as needed for sleep.     TURMERIC PO  Take 1 capsule by mouth 2 (two) times daily. With garlic and cayenne pepper     VITAMIN C PO  Take 1,000 mg by mouth 3 (three) times daily.     Vitamin D-3 5000 UNITS Tabs  Take 1 tablet by mouth daily.         SignedEarleen Newport 01/14/2014, 9:33  AM

## 2014-03-11 ENCOUNTER — Encounter (HOSPITAL_COMMUNITY): Payer: Self-pay | Admitting: Neurological Surgery

## 2014-11-03 ENCOUNTER — Inpatient Hospital Stay (HOSPITAL_COMMUNITY)
Admission: EM | Admit: 2014-11-03 | Discharge: 2014-11-06 | DRG: 066 | Disposition: A | Discharge status: Home / Self Care | Payer: Medicare HMO | Attending: Internal Medicine | Admitting: Internal Medicine

## 2014-11-03 ENCOUNTER — Encounter (HOSPITAL_COMMUNITY): Payer: Self-pay | Admitting: Emergency Medicine

## 2014-11-03 ENCOUNTER — Observation Stay (HOSPITAL_COMMUNITY): Payer: Medicare HMO

## 2014-11-03 ENCOUNTER — Emergency Department (HOSPITAL_COMMUNITY): Payer: Medicare HMO

## 2014-11-03 DIAGNOSIS — R4701 Aphasia: Secondary | ICD-10-CM | POA: Diagnosis not present

## 2014-11-03 DIAGNOSIS — G459 Transient cerebral ischemic attack, unspecified: Secondary | ICD-10-CM | POA: Diagnosis not present

## 2014-11-03 DIAGNOSIS — I10 Essential (primary) hypertension: Secondary | ICD-10-CM | POA: Diagnosis not present

## 2014-11-03 DIAGNOSIS — F329 Major depressive disorder, single episode, unspecified: Secondary | ICD-10-CM | POA: Diagnosis present

## 2014-11-03 DIAGNOSIS — S34129A Incomplete lesion of unspecified level of lumbar spinal cord, initial encounter: Secondary | ICD-10-CM

## 2014-11-03 DIAGNOSIS — Z79899 Other long term (current) drug therapy: Secondary | ICD-10-CM

## 2014-11-03 DIAGNOSIS — Z885 Allergy status to narcotic agent status: Secondary | ICD-10-CM

## 2014-11-03 DIAGNOSIS — I63412 Cerebral infarction due to embolism of left middle cerebral artery: Secondary | ICD-10-CM | POA: Insufficient documentation

## 2014-11-03 DIAGNOSIS — S32009A Unspecified fracture of unspecified lumbar vertebra, initial encounter for closed fracture: Secondary | ICD-10-CM | POA: Diagnosis present

## 2014-11-03 DIAGNOSIS — S065XAA Traumatic subdural hemorrhage with loss of consciousness status unknown, initial encounter: Secondary | ICD-10-CM | POA: Diagnosis present

## 2014-11-03 DIAGNOSIS — M810 Age-related osteoporosis without current pathological fracture: Secondary | ICD-10-CM | POA: Diagnosis present

## 2014-11-03 DIAGNOSIS — F418 Other specified anxiety disorders: Secondary | ICD-10-CM | POA: Diagnosis not present

## 2014-11-03 DIAGNOSIS — E785 Hyperlipidemia, unspecified: Secondary | ICD-10-CM | POA: Insufficient documentation

## 2014-11-03 DIAGNOSIS — I6200 Nontraumatic subdural hemorrhage, unspecified: Secondary | ICD-10-CM | POA: Diagnosis not present

## 2014-11-03 DIAGNOSIS — M479 Spondylosis, unspecified: Secondary | ICD-10-CM | POA: Diagnosis present

## 2014-11-03 DIAGNOSIS — Z8249 Family history of ischemic heart disease and other diseases of the circulatory system: Secondary | ICD-10-CM

## 2014-11-03 DIAGNOSIS — M1990 Unspecified osteoarthritis, unspecified site: Secondary | ICD-10-CM | POA: Diagnosis present

## 2014-11-03 DIAGNOSIS — I639 Cerebral infarction, unspecified: Secondary | ICD-10-CM | POA: Diagnosis present

## 2014-11-03 DIAGNOSIS — F419 Anxiety disorder, unspecified: Secondary | ICD-10-CM | POA: Diagnosis present

## 2014-11-03 DIAGNOSIS — S065X9A Traumatic subdural hemorrhage with loss of consciousness of unspecified duration, initial encounter: Secondary | ICD-10-CM | POA: Diagnosis present

## 2014-11-03 DIAGNOSIS — Z90710 Acquired absence of both cervix and uterus: Secondary | ICD-10-CM

## 2014-11-03 DIAGNOSIS — R471 Dysarthria and anarthria: Secondary | ICD-10-CM | POA: Diagnosis present

## 2014-11-03 DIAGNOSIS — Z981 Arthrodesis status: Secondary | ICD-10-CM

## 2014-11-03 DIAGNOSIS — S32009D Unspecified fracture of unspecified lumbar vertebra, subsequent encounter for fracture with routine healing: Secondary | ICD-10-CM

## 2014-11-03 LAB — I-STAT CHEM 8, ED
BUN: 17 mg/dL (ref 6–20)
CREATININE: 0.7 mg/dL (ref 0.44–1.00)
Calcium, Ion: 1.19 mmol/L (ref 1.13–1.30)
Chloride: 103 mmol/L (ref 101–111)
GLUCOSE: 105 mg/dL — AB (ref 65–99)
HCT: 50 % — ABNORMAL HIGH (ref 36.0–46.0)
Hemoglobin: 17 g/dL — ABNORMAL HIGH (ref 12.0–15.0)
Potassium: 3.9 mmol/L (ref 3.5–5.1)
SODIUM: 142 mmol/L (ref 135–145)
TCO2: 28 mmol/L (ref 0–100)

## 2014-11-03 LAB — COMPREHENSIVE METABOLIC PANEL
ALBUMIN: 3.7 g/dL (ref 3.5–5.0)
ALK PHOS: 64 U/L (ref 38–126)
ALT: 55 U/L — AB (ref 14–54)
ALT: 52 U/L (ref 14–54)
ANION GAP: 11 (ref 5–15)
ANION GAP: 8 (ref 5–15)
AST: 39 U/L (ref 15–41)
AST: 34 U/L (ref 15–41)
Albumin: 4.2 g/dL (ref 3.5–5.0)
Alkaline Phosphatase: 73 U/L (ref 38–126)
BILIRUBIN TOTAL: 0.6 mg/dL (ref 0.3–1.2)
BILIRUBIN TOTAL: 0.7 mg/dL (ref 0.3–1.2)
BUN: 13 mg/dL (ref 6–20)
BUN: 12 mg/dL (ref 6–20)
CALCIUM: 9.2 mg/dL (ref 8.9–10.3)
CO2: 25 mmol/L (ref 22–32)
CO2: 29 mmol/L (ref 22–32)
CREATININE: 0.66 mg/dL (ref 0.44–1.00)
CREATININE: 0.66 mg/dL (ref 0.44–1.00)
Calcium: 9.2 mg/dL (ref 8.9–10.3)
Chloride: 105 mmol/L (ref 101–111)
Chloride: 105 mmol/L (ref 101–111)
GFR calc non Af Amer: >60 mL/min (ref >60)
GFR calc non Af Amer: >60 mL/min (ref >60)
GLUCOSE: 106 mg/dL — AB (ref 65–99)
GLUCOSE: 107 mg/dL — AB (ref 65–99)
Potassium: 4 mmol/L (ref 3.5–5.1)
Potassium: 4.3 mmol/L (ref 3.5–5.1)
Sodium: 141 mmol/L (ref 135–145)
Sodium: 142 mmol/L (ref 135–145)
TOTAL PROTEIN: 7 g/dL (ref 6.5–8.1)
TOTAL PROTEIN: 6.6 g/dL (ref 6.5–8.1)

## 2014-11-03 LAB — ETHANOL: Alcohol, Ethyl (B): <5 mg/dL (ref <5)

## 2014-11-03 LAB — URINALYSIS, ROUTINE W REFLEX MICROSCOPIC
BILIRUBIN URINE: NEGATIVE
Glucose, UA: NEGATIVE mg/dL
HGB URINE DIPSTICK: NEGATIVE
Ketones, ur: NEGATIVE mg/dL
Leukocytes, UA: NEGATIVE
NITRITE: NEGATIVE
PROTEIN: NEGATIVE mg/dL
SPECIFIC GRAVITY, URINE: 1.006 (ref 1.005–1.030)
Urobilinogen, UA: 0.2 mg/dL (ref 0.0–1.0)
pH: 7.5 (ref 5.0–8.0)

## 2014-11-03 LAB — RAPID URINE DRUG SCREEN, HOSP PERFORMED
AMPHETAMINES: NOT DETECTED
Barbiturates: NOT DETECTED
Benzodiazepines: NOT DETECTED
Cocaine: NOT DETECTED
OPIATES: NOT DETECTED
TETRAHYDROCANNABINOL: NOT DETECTED

## 2014-11-03 LAB — CBC
HCT: 47 % — ABNORMAL HIGH (ref 36.0–46.0)
HEMATOCRIT: 43.9 % (ref 36.0–46.0)
HEMOGLOBIN: 14.6 g/dL (ref 12.0–15.0)
Hemoglobin: 15.8 g/dL — ABNORMAL HIGH (ref 12.0–15.0)
MCH: 29.3 pg (ref 26.0–34.0)
MCH: 28.9 pg (ref 26.0–34.0)
MCHC: 33.6 g/dL (ref 30.0–36.0)
MCHC: 33.3 g/dL (ref 30.0–36.0)
MCV: 87 fL (ref 78.0–100.0)
MCV: 86.8 fL (ref 78.0–100.0)
Platelets: 252 10*3/uL (ref 150–400)
Platelets: 252 10*3/uL (ref 150–400)
RBC: 5.4 MIL/uL — AB (ref 3.87–5.11)
RBC: 5.06 MIL/uL (ref 3.87–5.11)
RDW: 14.9 % (ref 11.5–15.5)
RDW: 15 % (ref 11.5–15.5)
WBC: 8.1 10*3/uL (ref 4.0–10.5)
WBC: 7.7 10*3/uL (ref 4.0–10.5)

## 2014-11-03 LAB — PROTIME-INR
INR: 0.96 (ref 0.00–1.49)
Prothrombin Time: 13 seconds (ref 11.6–15.2)

## 2014-11-03 LAB — I-STAT TROPONIN, ED: Troponin i, poc: 0.01 ng/mL (ref 0.00–0.08)

## 2014-11-03 LAB — DIFFERENTIAL
Basophils Absolute: 0.1 10*3/uL (ref 0.0–0.1)
Basophils Relative: 1 % (ref 0–1)
EOS PCT: 2 % (ref 0–5)
Eosinophils Absolute: 0.1 10*3/uL (ref 0.0–0.7)
LYMPHS ABS: 1.8 10*3/uL (ref 0.7–4.0)
LYMPHS PCT: 22 % (ref 12–46)
MONO ABS: 0.6 10*3/uL (ref 0.1–1.0)
Monocytes Relative: 8 % (ref 3–12)
NEUTROS ABS: 5.5 10*3/uL (ref 1.7–7.7)
Neutrophils Relative %: 67 % (ref 43–77)

## 2014-11-03 LAB — APTT: aPTT: 25 seconds (ref 24–37)

## 2014-11-03 LAB — TSH: TSH: 2.3 u[IU]/mL (ref 0.350–4.500)

## 2014-11-03 MED ORDER — ACETAMINOPHEN 325 MG PO TABS: 650.0000 mg | ORAL_TABLET | ORAL | Status: DC | PRN

## 2014-11-03 MED ORDER — ACETAMINOPHEN 650 MG RE SUPP: 650.0000 mg | RECTAL | Status: DC | PRN

## 2014-11-03 MED ORDER — HYDRALAZINE HCL 20 MG/ML IJ SOLN
5.0000 mg | Freq: Four times a day (QID) | INTRAMUSCULAR | Status: DC | PRN
  Administered 2014-11-05: 5 mg via INTRAVENOUS
  Filled 2014-11-03: qty 1

## 2014-11-03 MED ORDER — STROKE: EARLY STAGES OF RECOVERY BOOK: Freq: Once | Status: DC

## 2014-11-03 MED ORDER — SODIUM CHLORIDE 0.9 % IV SOLN
INTRAVENOUS | Status: AC
  Administered 2014-11-03: 19:00:00 via INTRAVENOUS

## 2014-11-03 MED ORDER — HEPARIN SODIUM (PORCINE) 5000 UNIT/ML IJ SOLN
5000.0000 [IU] | Freq: Three times a day (TID) | INTRAMUSCULAR | Status: DC
  Administered 2014-11-03 – 2014-11-06 (×9): 5000 [IU] via SUBCUTANEOUS
  Filled 2014-11-03 (×9): qty 1

## 2014-11-03 NOTE — Consult Note (Signed)
Referring Physician: [place]    Chief Complaint: Difficulty with speech  HPI: Erica [name] is an 81 y.o. female who reports that on yesterday morning while talking to a friend on the phone she noted that she was unable to get her words out correctly for about 3 minutes.  Her symptoms resolved spontaneously and she and her friend just laughed it off.  She was able to talk normally last evening on a phone call before going to bed. This morning her first attempt at speech was with her prayer partner at about 10AM.  Again she was unable to get the words out correctly.  This episode lasted longer but again resolved on its own and by the time her partner left two hours later her speech was at baseline.  Her children were called though and when her son arrived sometime after 1PM the patient was working in the yard.  Again she had difficulty getting her words out.  He noted that she was drooling and that she was moving slower than usual.  EMS was called at that time.  By the time EMS arrived (10 minutes) she was back to baseline and has had no recurrence of symptoms.    Date last known well: Date: 11/02/2014 Time last known well: Time: 22:00 tPA Given: No: Resolution of symptoms  Past Medical History  Diagnosis Date  . Spondylitis, ankylosing   . Anxiety     h/o curious anxiety   . Arthritis     osteoporosis, ankylosing spondylosis   . Cancer     basal cell- facial   . History of blood transfusion     for back surgery  . Osteoporosis     Past Surgical History  Procedure Laterality Date  . Abdominal hysterectomy    . Hardware removal N/A 10/23/2013    Procedure: HARDWARE REMOVAL;  Surgeon: Kristeen Miss, MD;  Location: [place] [facility];  Service: Neurosurgery;  Laterality: N/A;  HARDWARE REMOVAL  . Craniotomy Right 10/06/2013    Procedure: CRANIECTOMY HEMATOMA EVACUATION SUBDURAL, PLACEMENT OF SKULL FLAP IN ABDOMEN;  Surgeon: Kristeen Miss, MD;  Location: [place] [facility];  Service: Neurosurgery;   Laterality: Right;  . Posterior lumbar fusion N/A 10/06/2013    Procedure: POSTERIOR LUMBAR FUSION  lumbar one/two;  Surgeon: Kristeen Miss, MD;  Location: [place] [facility];  Service: Neurosurgery;  Laterality: N/A;  . Cranioplasty N/A 01/11/2014    Procedure: CRANIOPLASTY FROM FLAP IN ABDOMEN;  Surgeon: Kristeen Miss, MD;  Location: [place] [facility];  Service: Neurosurgery;  Laterality: N/A;    Family history: significant for HTN; otherwise noncontributory according to patient   Social History:  reports that she has never smoked. She does not have any smokeless tobacco history on file. She reports that she does not drink alcohol or use illicit drugs.  Allergies:  Allergies  Allergen Reactions  . Codeine Nausea Only    Medications:  I have reviewed the patient's current medications. Prior to Admission:  Prescriptions prior to admission  Medication Sig Dispense Refill Last Dose  . Ascorbic Acid (VITAMIN C PO) Take 500 mg by mouth 3 (three) times daily.    11/03/2014 at Unknown time  . BLACK CURRANT SEED OIL PO Take 1 tablet by mouth 2 (two) times daily.   11/02/2014 at Unknown time  . Calcium Carb-Cholecalciferol (CALCIUM 600 + D PO) Take 1 tablet by mouth daily.   11/03/2014 at Unknown time  . Cholecalciferol (VITAMIN D-3) 5000 UNITS TABS Take 5,000 Units by mouth daily.  11/03/2014 at Unknown time  . Digestive Enzymes CAPS Take 1 capsule by mouth 3 (three) times daily with meals.   11/03/2014 at Unknown time  . magnesium oxide (MAG-OX) 400 (241.3 MG) MG tablet Take 0.5 tablets (200 mg total) by mouth daily. (Patient taking differently: Take 400 mg by mouth 2 (two) times daily. )   11/03/2014 at Unknown time  . Multiple Vitamin (MULTIVITAMIN WITH MINERALS) TABS tablet Take 1 tablet by mouth daily.   11/03/2014 at Unknown time  . Omega-3 Fatty Acids (OMEGA-3 FISH OIL PO) Take 1 capsule by mouth 2 (two) times daily.   11/02/2014 at Unknown time  . OVER THE COUNTER MEDICATION Take 1 tablet by mouth 2 (two) times  daily. Ultimate Bone Support   11/03/2014 at Unknown time  . OVER THE COUNTER MEDICATION Take 1 capsule by mouth 3 (three) times daily with meals. Amazing Grape   11/03/2014 at Unknown time  . OVER THE COUNTER MEDICATION Take 1 tablet by mouth 2 (two) times daily. Joint performance plus   11/03/2014 at Unknown time  . OVER THE COUNTER MEDICATION Take 1 tablet by mouth 3 (three) times daily. Thytrophine PMG   11/03/2014 at Unknown time  . OVER THE COUNTER MEDICATION Take 1 tablet by mouth 3 (three) times daily. Calcifood   11/02/2014 at Unknown time  . OVER THE COUNTER MEDICATION Take 1 tablet by mouth 3 (three) times daily. utrophin   11/03/2014 at Unknown time  . OVER THE COUNTER MEDICATION Take 1 tablet by mouth 3 (three) times daily. ovex   11/03/2014 at Unknown time  . OVER THE COUNTER MEDICATION Take 1 tablet by mouth daily. natto bp plus   11/03/2014 at Unknown time  . OVER THE COUNTER MEDICATION Take 1 capsule by mouth 3 (three) times daily with meals. Nitric oxide booster   11/03/2014 at Unknown time  . Probiotic Product (ADVANCED PROBIOTIC PO) Take 1 capsule by mouth daily.   11/03/2014 at Unknown time  . Specialty Vitamins Products (BILBERRY PLUS) CAPS Take 1 capsule by mouth 2 (two) times daily.   11/03/2014 at Unknown time  . TURMERIC PO Take 1 capsule by mouth 2 (two) times daily. With garlic and cayenne pepper   Past Week at Unknown time   Scheduled: .  stroke: mapping our early stages of recovery book   Does not apply Once  . heparin  5,000 Units Subcutaneous 3 times per day    ROS: History obtained from the patient  General ROS: negative for - chills, fatigue, fever, night sweats, weight gain or weight loss Psychological ROS: negative for - behavioral disorder, hallucinations, memory difficulties, mood swings or suicidal ideation Ophthalmic ROS: negative for - blurry vision, double vision, eye pain or loss of vision ENT ROS: negative for - epistaxis, nasal discharge, oral lesions, sore throat,  tinnitus or vertigo Allergy and Immunology ROS: negative for - hives or itchy/watery eyes Hematological and Lymphatic ROS: negative for - bleeding problems, bruising or swollen lymph nodes Endocrine ROS: negative for - galactorrhea, hair pattern changes, polydipsia/polyuria or temperature intolerance Respiratory ROS: negative for - cough, hemoptysis, shortness of breath or wheezing Cardiovascular ROS: negative for - chest pain, dyspnea on exertion, edema or irregular heartbeat Gastrointestinal ROS: negative for - abdominal pain, diarrhea, hematemesis, nausea/vomiting or stool incontinence Genito-Urinary ROS: negative for - dysuria, hematuria, incontinence or urinary frequency/urgency Musculoskeletal ROS: negative for - joint swelling or muscular weakness Neurological ROS: as noted in HPI Dermatological ROS: negative for rash and skin lesion changes  Physical Examination: Blood pressure 189/91, pulse 93, temperature 98.4 F (36.9 C), temperature source Oral, resp. rate 20, SpO2 98 %.  Gen- NAD HEENT-  Normocephalic, no lesions, without obvious abnormality.  Normal external eye and conjunctiva.  Normal TM's bilaterally.  Normal auditory canals and external ears. Normal external nose, mucus membranes and septum.  Normal pharynx. Cardiovascular- S1, S2 normal, pulses palpable throughout   Lungs- chest clear, no wheezing, rales, normal symmetric air entry Abdomen- soft, non-tender; bowel sounds normal; no masses,  no organomegaly Extremities- no edema Lymph-no adenopathy palpable Musculoskeletal-no joint tenderness, deformity or swelling Skin-warm and dry, no hyperpigmentation, vitiligo, or suspicious lesions  Neurological Examination Mental Status: Alert, oriented, thought content appropriate.  Speech fluent without evidence of aphasia.  Able to follow 3 step commands without difficulty. Cranial Nerves: II: Discs flat bilaterally; Visual fields grossly normal, pupils equal, round, reactive  to light and accommodation III,IV, VI: ptosis not present, extra-ocular motions intact bilaterally V,VII: smile symmetric, facial light touch sensation normal bilaterally VIII: hearing normal bilaterally IX,X: gag reflex present XI: bilateral shoulder shrug XII: midline tongue extension Motor: Right : Upper extremity   5/5    Left:     Upper extremity   5/5  Lower extremity   5/5     Lower extremity   5/5 Tone and bulk:normal tone throughout; no atrophy noted Sensory: Pinprick and light touch intact throughout, bilaterally Deep Tendon Reflexes: 2+ and symmetric with absent AJ's bilaterally Plantars: Right: downgoing   Left: downgoing Cerebellar: normal finger-to-nose and normal heel-to-shin testing bilaterally  Laboratory Studies:  Basic Metabolic Panel:  Recent Labs Lab 11/03/14 1633 11/03/14 1634 11/03/14 1953  NA 141 142 142  K 4.0 3.9 4.3  CL 105 103 105  CO2 25  --  29  GLUCOSE 106* 105* 107*  BUN 13 17 12   CREATININE 0.66 0.70 0.66  CALCIUM 9.2  --  9.2    Liver Function Tests:  Recent Labs Lab 11/03/14 1633 11/03/14 1953  AST 39 34  ALT 55* 52  ALKPHOS 73 64  BILITOT 0.6 0.7  PROT 7.0 6.6  ALBUMIN 4.2 3.7   No results for input(s): LIPASE, AMYLASE in the last 168 hours. No results for input(s): AMMONIA in the last 168 hours.  CBC:  Recent Labs Lab 11/03/14 1633 11/03/14 1634 11/03/14 1953  WBC 8.1  --  7.7  NEUTROABS 5.5  --   --   HGB 15.8* 17.0* 14.6  HCT 47.0* 50.0* 43.9  MCV 87.0  --  86.8  PLT 252  --  252    Cardiac Enzymes: No results for input(s): CKTOTAL, CKMB, CKMBINDEX, TROPONINI in the last 168 hours.  BNP: Invalid input(s): POCBNP  CBG: No results for input(s): GLUCAP in the last 168 hours.  Microbiology: Results for orders placed or performed during the hospital encounter of 10/23/13  MRSA PCR Screening     Status: None   Collection Time: 10/24/13 12:54 AM  Result Value Ref Range Status   MRSA by PCR NEGATIVE  NEGATIVE Final    Comment:        The GeneXpert MRSA Assay (FDA approved for NASAL specimens only), is one component of a comprehensive MRSA colonization surveillance program. It is not intended to diagnose MRSA infection nor to guide or monitor treatment for MRSA infections.  Culture, Urine     Status: None   Collection Time: 10/24/13  3:31 AM  Result Value Ref Range Status   Specimen Description URINE, RANDOM  Final  Special Requests PATIENT ON FOLLOWING ANCEF  Final   Culture  Setup Time   Final    10/24/2013 15:23 Performed at [place] Performed at [facility]  Final   Culture NO GROWTH Performed at [facility]  Final   Report Status 10/25/2013 FINAL  Final    Coagulation Studies:  Recent Labs  11/03/14 1633  LABPROT 13.0  INR 0.96    Urinalysis:  Recent Labs Lab 11/03/14 [place]  LABSPEC 1.006  PHURINE 7.5  GLUCOSEU NEGATIVE  HGBUR NEGATIVE  BILIRUBINUR NEGATIVE  KETONESUR NEGATIVE  PROTEINUR NEGATIVE  UROBILINOGEN 0.2  NITRITE NEGATIVE  LEUKOCYTESUR NEGATIVE    Lipid Panel: No results found for: CHOL, TRIG, HDL, CHOLHDL, VLDL, LDLCALC  HgbA1C: No results found for: HGBA1C  Urine Drug Screen:     Component Value Date/Time   LABOPIA NONE DETECTED 11/03/2014 1637   COCAINSCRNUR NONE DETECTED 11/03/2014 1637   LABBENZ NONE DETECTED 11/03/2014 1637   AMPHETMU NONE DETECTED 11/03/2014 1637   THCU NONE DETECTED 11/03/2014 1637   LABBARB NONE DETECTED 11/03/2014 1637    Alcohol Level:  Recent Labs Lab 11/03/14 1953  ETH <5    Other results: EKG: sinus rhythm at 91 bpm, left atrial enalrgement.  Imaging: Ct Head Wo Contrast  11/03/2014   CLINICAL DATA:  TIA, evaluate for bleed  EXAM: CT HEAD WITHOUT CONTRAST  TECHNIQUE: Contiguous axial images were obtained from the base of the skull through the vertex without intravenous contrast.  COMPARISON:  10/06/2013  FINDINGS: The  patient is status post right frontoparietal craniotomy. No intracranial hemorrhage, mass effect or midline shift. Paranasal sinuses and mastoid air cells are unremarkable. There is no acute cortical infarction. Moderate cerebral atrophy. Moderate periventricular and patchy subcortical white matter decreased attenuation probable due to chronic small vessel ischemic changes. There is encephalomalacia in right temporal lobe anteriorly probable sequela from prior hemorrhage. No mass lesion is noted on this unenhanced scan.  IMPRESSION: No acute intracranial abnormality. There is no acute cortical infarction. Moderate cerebral atrophy. Moderate periventricular and patchy subcortical white matter decreased attenuation probable due to chronic small vessel ischemic changes. There is encephalomalacia in right temporal lobe anteriorly probable sequela from prior hemorrhage.   Electronically Signed   By: Lahoma Crocker M.D.   On: 11/03/2014 16:06    Assessment: 81 y.o. female presenting after three episodes of expressive aphasia within 48 hours.  Patient on no antiplatelet therapy at home.  Now at baseline. Head CT personally reviewed and shows no acute changes.  Concern is for TIA. Since patient with history of head injury approximately one year ago requiring surgical intervention, can not rule out seizure as well.  Further work up recommended.  Stroke Risk Factors - none  Plan: 1. HgbA1c, fasting lipid panel 2. MRI, MRA  of the brain without contrast 3. PT consult, OT consult, Speech consult 4. Echocardiogram 5. Carotid dopplers 6. Prophylactic therapy-Antiplatelet med: Aspirin - dose 81mg  daily 7. NPO until RN stroke swallow screen 8. Telemetry monitoring 9. Frequent neuro checks 10. EEG   Alexis Goodell, MD [facility] [phone] 11/03/2014, 9:08 PM
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
## 2014-11-03 NOTE — Progress Notes (Signed)
Pt arrived on unit via stretcher. Pt ambulated from stretcher to bed. Assisted to bathroom and vitals obtained. Telemetry applied and CCMD notified. Welcomed and oriented to room. Call bell within reach. Daughters at beside. Vitals stable--will continue to monitor. [employer]

## 2014-11-03 NOTE — ED Notes (Signed)
EMS - Patient coming from home with c/o of TIA.  EMS on scene noted no deficits on arrival at 1342.  Stroke symptoms discovered at 1330 by son, unknown LKN, slurred speech and drooling noted by family around 10:30am.
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
## 2014-11-04 ENCOUNTER — Observation Stay (HOSPITAL_COMMUNITY): Payer: Medicare HMO

## 2014-11-04 DIAGNOSIS — F329 Major depressive disorder, single episode, unspecified: Secondary | ICD-10-CM | POA: Diagnosis present

## 2014-11-04 DIAGNOSIS — I63412 Cerebral infarction due to embolism of left middle cerebral artery: Secondary | ICD-10-CM | POA: Insufficient documentation

## 2014-11-04 DIAGNOSIS — I10 Essential (primary) hypertension: Secondary | ICD-10-CM

## 2014-11-04 DIAGNOSIS — Z79899 Other long term (current) drug therapy: Secondary | ICD-10-CM | POA: Diagnosis not present

## 2014-11-04 DIAGNOSIS — Z981 Arthrodesis status: Secondary | ICD-10-CM | POA: Diagnosis not present

## 2014-11-04 DIAGNOSIS — Z8249 Family history of ischemic heart disease and other diseases of the circulatory system: Secondary | ICD-10-CM | POA: Diagnosis not present

## 2014-11-04 DIAGNOSIS — R4701 Aphasia: Secondary | ICD-10-CM | POA: Diagnosis present

## 2014-11-04 DIAGNOSIS — G459 Transient cerebral ischemic attack, unspecified: Secondary | ICD-10-CM

## 2014-11-04 DIAGNOSIS — M479 Spondylosis, unspecified: Secondary | ICD-10-CM | POA: Diagnosis present

## 2014-11-04 DIAGNOSIS — E785 Hyperlipidemia, unspecified: Secondary | ICD-10-CM | POA: Diagnosis present

## 2014-11-04 DIAGNOSIS — R471 Dysarthria and anarthria: Secondary | ICD-10-CM | POA: Diagnosis present

## 2014-11-04 DIAGNOSIS — Z885 Allergy status to narcotic agent status: Secondary | ICD-10-CM | POA: Diagnosis not present

## 2014-11-04 DIAGNOSIS — M810 Age-related osteoporosis without current pathological fracture: Secondary | ICD-10-CM | POA: Diagnosis present

## 2014-11-04 DIAGNOSIS — I639 Cerebral infarction, unspecified: Secondary | ICD-10-CM | POA: Diagnosis not present

## 2014-11-04 DIAGNOSIS — F418 Other specified anxiety disorders: Secondary | ICD-10-CM | POA: Diagnosis present

## 2014-11-04 DIAGNOSIS — F419 Anxiety disorder, unspecified: Secondary | ICD-10-CM | POA: Diagnosis present

## 2014-11-04 DIAGNOSIS — M1990 Unspecified osteoarthritis, unspecified site: Secondary | ICD-10-CM | POA: Diagnosis present

## 2014-11-04 DIAGNOSIS — S32009D Unspecified fracture of unspecified lumbar vertebra, subsequent encounter for fracture with routine healing: Secondary | ICD-10-CM | POA: Diagnosis not present

## 2014-11-04 DIAGNOSIS — Z90710 Acquired absence of both cervix and uterus: Secondary | ICD-10-CM | POA: Diagnosis not present

## 2014-11-04 LAB — LIPID PANEL
CHOLESTEROL: 181 mg/dL (ref 0–200)
HDL: 41 mg/dL (ref >40)
LDL Cholesterol: 109 mg/dL — ABNORMAL HIGH (ref 0–99)
TRIGLYCERIDES: 153 mg/dL — AB (ref <150)
Total CHOL/HDL Ratio: 4.4 RATIO
VLDL: 31 mg/dL (ref 0–40)

## 2014-11-04 MED ORDER — STROKE: EARLY STAGES OF RECOVERY BOOK: Freq: Once | Status: DC

## 2014-11-04 MED ORDER — ASPIRIN 325 MG PO TABS
325.0000 mg | ORAL_TABLET | Freq: Every day | ORAL | Status: DC
  Administered 2014-11-04 – 2014-11-06 (×3): 325 mg via ORAL
  Filled 2014-11-04 (×3): qty 1

## 2014-11-04 MED ORDER — ASPIRIN 300 MG RE SUPP: 300.0000 mg | Freq: Every day | RECTAL | Status: DC

## 2014-11-04 MED ORDER — CLOPIDOGREL BISULFATE 75 MG PO TABS
75.0000 mg | ORAL_TABLET | Freq: Every day | ORAL | Status: DC
  Administered 2014-11-04 – 2014-11-06 (×3): 75 mg via ORAL
  Filled 2014-11-04 (×3): qty 1

## 2014-11-04 MED ORDER — ATORVASTATIN CALCIUM 10 MG PO TABS: 20.0000 mg | ORAL_TABLET | Freq: Every day | ORAL | Status: DC

## 2014-11-04 NOTE — Progress Notes (Addendum)
STROKE TEAM PROGRESS NOTE   SUBJECTIVE (INTERVAL HISTORY) Erica [name] is at the bedside.  Overall she feels Erica condition is completely resolved. However, she had a 3 episode of aphasia prior to admission. Also, she had a one episode this morning while in bathroom sitting in the toilet she started to have difficulty with words out, and worsened when she was at a sink washing Erica hand. Once she was back to the bed, symptoms resolved. Blood pressure was not checked at that time.   OBJECTIVE Temp:  [97.9 F (36.6 C)-99.1 F (37.3 C)] 98.2 F (36.8 C) (09/08 1812) Pulse Rate:  [75-95] 92 (09/08 1812) Cardiac Rhythm:  [-] Sinus tachycardia (09/08 0700) Resp:  [12-18] 16 (09/08 1812) BP: (148-187)/(66-83) 172/83 mmHg (09/08 1812) SpO2:  [94 %-97 %] 95 % (09/08 1812) Weight:  [128 lb (58.06 kg)] 128 lb (58.06 kg) (09/08 0949)  No results for input(s): GLUCAP in the last 168 hours.  Recent Labs Lab 11/03/14 1633 11/03/14 1634 11/03/14 1953  NA 141 142 142  K 4.0 3.9 4.3  CL 105 103 105  CO2 25  --  29  GLUCOSE 106* 105* 107*  BUN 13 17 12   CREATININE 0.66 0.70 0.66  CALCIUM 9.2  --  9.2    Recent Labs Lab 11/03/14 1633 11/03/14 1953  AST 39 34  ALT 55* 52  ALKPHOS 73 64  BILITOT 0.6 0.7  PROT 7.0 6.6  ALBUMIN 4.2 3.7    Recent Labs Lab 11/03/14 1633 11/03/14 1634 11/03/14 1953  WBC 8.1  --  7.7  NEUTROABS 5.5  --   --   HGB 15.8* 17.0* 14.6  HCT 47.0* 50.0* 43.9  MCV 87.0  --  86.8  PLT 252  --  252   No results for input(s): CKTOTAL, CKMB, CKMBINDEX, TROPONINI in the last 168 hours.  Recent Labs  11/03/14 1633  LABPROT 13.0  INR 0.96    Recent Labs  11/03/14 1638  COLORURINE YELLOW  LABSPEC 1.006  PHURINE 7.5  GLUCOSEU NEGATIVE  HGBUR NEGATIVE  BILIRUBINUR NEGATIVE  KETONESUR NEGATIVE  PROTEINUR NEGATIVE  UROBILINOGEN 0.2  NITRITE NEGATIVE  LEUKOCYTESUR NEGATIVE       Component Value Date/Time   CHOL 181 11/04/2014 0715   TRIG 153*  11/04/2014 0715   HDL 41 11/04/2014 0715   CHOLHDL 4.4 11/04/2014 0715   VLDL 31 11/04/2014 0715   LDLCALC 109* 11/04/2014 0715   No results found for: HGBA1C    Component Value Date/Time   LABOPIA NONE DETECTED 11/03/2014 1637   COCAINSCRNUR NONE DETECTED 11/03/2014 1637   LABBENZ NONE DETECTED 11/03/2014 1637   AMPHETMU NONE DETECTED 11/03/2014 1637   THCU NONE DETECTED 11/03/2014 1637   LABBARB NONE DETECTED 11/03/2014 1637     Recent Labs Lab 11/03/14 1953  ETH <5    I have personally reviewed the radiological images below and agree with the radiology interpretations.  Ct Head Wo Contrast  11/03/2014    IMPRESSION: No acute intracranial abnormality. There is no acute cortical infarction. Moderate cerebral atrophy. Moderate periventricular and patchy subcortical white matter decreased attenuation probable due to chronic small vessel ischemic changes. There is encephalomalacia in right temporal lobe anteriorly probable sequela from prior hemorrhage.    Mri and Mra Brain Wo Contrast  11/04/2014   IMPRESSION: 1. Cluster of small acute infarcts in the central left MCA territory white matter and cortex. 2. High-grade bilateral distal M1 segment stenosis and left M2 branch occlusion (which places additional  brain at risk of infarction). 3. Extensive posterior circulation atherosclerotic disease with moderate basilar and high-grade bilateral proximal PCA stenoses. 4. Atrophy and chronic small vessel disease. 5. Ankylosing spondylitis with diffuse cervical ankylosis. 6. Remote right temporal contusion.   Carotid Doppler  Bilateral: 1-39% ICA stenosis. Vertebral artery flow is antegrade.  2D Echocardiogram  - Left ventricle: The cavity size was normal. Wall thickness was increased in a pattern of moderate LVH. Systolic function was normal. The estimated ejection fraction was in the range of 55% to 60%. Wall motion was normal; there were no regional wall motion abnormalities.  Doppler parameters are consistent with abnormal left ventricular relaxation (grade 1 diastolic dysfunction). - Mitral valve: Calcified annulus. There was mild regurgitation.  EKG  normal sinus rhythm. For complete results please see formal report.  PHYSICAL EXAM  Temp:  [97.9 F (36.6 C)-99.1 F (37.3 C)] 98.2 F (36.8 C) (09/08 1812) Pulse Rate:  [75-95] 92 (09/08 1812) Resp:  [12-18] 16 (09/08 1812) BP: (148-187)/(66-83) 172/83 mmHg (09/08 1812) SpO2:  [94 %-97 %] 95 % (09/08 1812) Weight:  [128 lb (58.06 kg)] 128 lb (58.06 kg) (09/08 0949)  General - Well nourished, well developed, in no apparent distress.  Ophthalmologic - Sharp disc margins OU.  Cardiovascular - Regular rate and rhythm with no murmur.  Neck - supple, no carotid bruits  Mental Status -  Level of arousal and orientation to time, place, and person were intact. Language including expression, naming, repetition, comprehension was assessed and found intact. Fund of Knowledge was assessed and was intact.  Cranial Nerves II - XII - II - Visual field intact OU. III, IV, VI - Extraocular movements intact. V - Facial sensation intact bilaterally. VII - Facial movement intact bilaterally. VIII - Hearing & vestibular intact bilaterally. X - Palate elevates symmetrically. XI - Chin turning & shoulder shrug intact bilaterally. XII - Tongue protrusion intact.  Motor Strength - The patient's strength was normal in all extremities and pronator drift was absent.  Bulk was normal and fasciculations were absent.   Motor Tone - Muscle tone was assessed at the neck and appendages and was normal.  Reflexes - The patient's reflexes were symmetrical in all extremities and she had no pathological reflexes.  Sensory - Light touch, temperature/pinprick were assessed and were symmetrical.    Coordination - The patient had normal movements in the hands and feet with no ataxia or dysmetria.  Tremor was absent.  Gait and  Station - deferred due to safety concerns.   ASSESSMENT/PLAN [name] is a 81 y.o. female with history of anxiety and arthritis admitted for episodic aphasia. Symptoms resolved.    Stroke:  Dominant left patchy left MCA territory infarct embolic secondary to unclear source  MRI  left patchy MCA territory infarct  MRA  left M2 occlusion, left P2 stenosis, bilateral distal MCA branches stenosis  Carotid Doppler  unremarkable  2D Echo  Unremarkable  Recommend TEE to evaluate cardioembolic source of the emboli. If TEE negative, consider group recorder to rule out A. Fib. If TEE is not able to be done due to scheduling, recommend 30 day cardiac event monitor as outpatient.  LDL 109  HgbA1c pending  SCDs for VTE prophylaxis  Diet regular Room service appropriate?: Yes; Fluid consistency:: Thin   no antithrombotic prior to admission, now on aspirin 325 mg orally every day and clopidogrel 75 mg orally every day. Due to intracranial stenosis, recommend dural antiplatelet for 3 months and then Plavix  alone.  Patient counseled to be compliant with Erica antithrombotic medications  Ongoing aggressive stroke risk factor management  Therapy recommendations:  Pending  Disposition:  Pending  Hypertension Home meds none Permissive hypertension (OK if <220/120) for 24-48 hours post stroke and then gradually normalized within 5-7 days.  Bp at high side   Avoid hypotension due to left M2 occlusion  Hyperlipidemia  Home meds:  Fish oil   Currently on none  LDL 109, goal < 70  Patient refused statin  Other Stroke Risk Factors  Advanced age  Other Active Problems    Other Pertinent History    Hospital day # 0  The patient is at risk for recurrent strokes and TIAs and she needs ongoing stroke evaluation and aggressive risk factor modification. I spent more than 40 minutes of face to face time with the patient. Greater than 50% of time was spent in counseling and  coordination of care.   Rosalin Hawking, MD PhD Stroke Neurology 11/04/2014 6:49 PM  To contact Stroke Continuity provider, please refer to [email or web address] After hours, contact General Neurology

## 2014-11-04 NOTE — Progress Notes (Signed)
Patient with slurred speech, aphagia which is different from AM assessment. AM NIHHS 0, NIHHS is now 2. Patient's daughter told RN that patient had a "couple minute" episode of not being able to speak, only making noises. Patient was then returned to bed. Patient is now verbal, mild slurred speech noted, loss of fluency, patient stating that a hammock is a hamster. Neurology PA paged. Patient is now resting in bed with daughter at bedside. Will continue to monitor.

## 2014-11-04 NOTE — Progress Notes (Signed)
PT Cancellation Note  Patient Details Name: Erica [name] MRN: [id number] DOB: December 31, 1933   Cancelled Treatment:    Reason Eval/Treat Not Completed: Medical issues which prohibited therapy Noted that patient had extension of symptoms this AM per RN report. Will hold formal physical therapy evaluation at this time, as we await neurology consult. Please page for any questions.  Ellouise Newer 11/04/2014, 3:03 PM Elayne Snare, [place]

## 2014-11-04 NOTE — Progress Notes (Signed)
PATIENT DETAILS Name: Erica [name] Age: 81 y.o. Sex: female Date of Birth: 09-22-1933 Admit Date: 11/03/2014 Admitting Physician Thayer Headings, MD PYP:[id number],IZTI Mallie Mussel, MD  Subjective: No symptoms this morning-speech back to baseline.  Assessment/Plan: Principal Problem: Acute CVA: MRI brain showed cluster of small acute infarcts in the left MCA territory, carotid Doppler negative for significant stenosis. The echocardiogram pending. No deficits on exam this morning, speech clear. Neurology contemplating doing a TEE, await A1c and lipid panel. Continue current antiplatelets agents.  Active Problems: History of  Traumatic closed displaced fracture of lumbar spine with incomplete lesion of spinal cordAnd Subdural hematoma: Requiring craniotomy, lumbar fusion in August 2015. Currently stable.   Disposition: Remain inpatient  Antimicrobial agents  See below  Anti-infectives    None      DVT Prophylaxis: Prophylactic Heparin   Code Status: Full code   Family Communication None at bedside  Procedures: None  CONSULTS:  neurology  Time spent 40 minutes-Greater than 50% of this time was spent in counseling, explanation of diagnosis, planning of further management, and coordination of care.  MEDICATIONS: Scheduled Meds: .  stroke: mapping our early stages of recovery book   Does not apply Once  . aspirin  325 mg Oral Daily   Or  . aspirin  300 mg Rectal Daily  . clopidogrel  75 mg Oral Daily  . heparin  5,000 Units Subcutaneous 3 times per day   Continuous Infusions:  PRN Meds:.acetaminophen **OR** acetaminophen, hydrALAZINE    PHYSICAL EXAM: Vital signs in last 24 hours: Filed Vitals:   11/04/14 0630 11/04/14 0949 11/04/14 1017 11/04/14 1320  BP: 164/82  159/78   Pulse: 81  95   Temp: 98.4 F (36.9 C)  99.1 F (37.3 C) 98.1 F (36.7 C)  TempSrc: Oral  Oral   Resp: 18  18   Height:  4\' 11"  (1.499 m)    Weight:  58.06 kg (128 lb)      SpO2: 95%  96%     Weight change:  Filed Weights   11/04/14 0949  Weight: 58.06 kg (128 lb)   Body mass index is 25.84 kg/(m^2).   Gen Exam: Awake and alert with clear speech.   Neck: Supple, No JVD.   Chest: B/L Clear.   CVS: S1 S2 Regular, no murmurs.  Abdomen: soft, BS +, non tender, non distended.  Extremities: no edema, lower extremities warm to touch. Neurologic: Non Focal.   Skin: No Rash.  Wounds: N/A.    Intake/Output from previous day: No intake or output data in the 24 hours ending 11/04/14 1442   LAB RESULTS: CBC  Recent Labs Lab 11/03/14 1633 11/03/14 1634 11/03/14 1953  WBC 8.1  --  7.7  HGB 15.8* 17.0* 14.6  HCT 47.0* 50.0* 43.9  PLT 252  --  252  MCV 87.0  --  86.8  MCH 29.3  --  28.9  MCHC 33.6  --  33.3  RDW 14.9  --  15.0  LYMPHSABS 1.8  --   --   MONOABS 0.6  --   --   EOSABS 0.1  --   --   BASOSABS 0.1  --   --     Chemistries   Recent Labs Lab 11/03/14 1633 11/03/14 1634 11/03/14 1953  NA 141 142 142  K 4.0 3.9 4.3  CL 105 103 105  CO2 25  --  29  GLUCOSE 106* 105* 107*  BUN 13 17 12   CREATININE 0.66 0.70 0.66  CALCIUM 9.2  --  9.2    CBG: No results for input(s): GLUCAP in the last 168 hours.  GFR Estimated Creatinine Clearance: 45 mL/min (by C-G formula based on Cr of 0.66).  Coagulation profile  Recent Labs Lab 11/03/14 1633  INR 0.96    Cardiac Enzymes No results for input(s): CKMB, TROPONINI, MYOGLOBIN in the last 168 hours.  Invalid input(s): CK  Invalid input(s): POCBNP No results for input(s): DDIMER in the last 72 hours. No results for input(s): HGBA1C in the last 72 hours.  Recent Labs  11/04/14 0715  CHOL 181  HDL 41  LDLCALC 109*  TRIG 153*  CHOLHDL 4.4    Recent Labs  11/03/14 1953  TSH 2.300   No results for input(s): VITAMINB12, FOLATE, FERRITIN, TIBC, IRON, RETICCTPCT in the last 72 hours. No results for input(s): LIPASE, AMYLASE in the last 72 hours.  Urine Studies No  results for input(s): UHGB, CRYS in the last 72 hours.  Invalid input(s): UACOL, UAPR, USPG, UPH, UTP, UGL, UKET, UBIL, UNIT, UROB, ULEU, UEPI, UWBC, URBC, UBAC, CAST, UCOM, BILUA  MICROBIOLOGY: No results found for this or any previous visit (from the past 240 hour(s)).  RADIOLOGY STUDIES/RESULTS: Ct Head Wo Contrast  11/03/2014   CLINICAL DATA:  TIA, evaluate for bleed  EXAM: CT HEAD WITHOUT CONTRAST  TECHNIQUE: Contiguous axial images were obtained from the base of the skull through the vertex without intravenous contrast.  COMPARISON:  10/06/2013  FINDINGS: The patient is status post right frontoparietal craniotomy. No intracranial hemorrhage, mass effect or midline shift. Paranasal sinuses and mastoid air cells are unremarkable. There is no acute cortical infarction. Moderate cerebral atrophy. Moderate periventricular and patchy subcortical white matter decreased attenuation probable due to chronic small vessel ischemic changes. There is encephalomalacia in right temporal lobe anteriorly probable sequela from prior hemorrhage. No mass lesion is noted on this unenhanced scan.  IMPRESSION: No acute intracranial abnormality. There is no acute cortical infarction. Moderate cerebral atrophy. Moderate periventricular and patchy subcortical white matter decreased attenuation probable due to chronic small vessel ischemic changes. There is encephalomalacia in right temporal lobe anteriorly probable sequela from prior hemorrhage.   Electronically Signed   By: Lahoma Crocker M.D.   On: 11/03/2014 16:06   Mr Brain Wo Contrast  11/04/2014   CLINICAL DATA:  TIA with slurred speech and expressive aphasia.  EXAM: MRI HEAD WITHOUT CONTRAST  MRA HEAD WITHOUT CONTRAST  TECHNIQUE: Multiplanar, multiecho pulse sequences of the brain and surrounding structures were obtained without intravenous contrast. Angiographic images of the head were obtained using MRA technique without contrast.  COMPARISON:  Head CT from earlier today   FINDINGS: MRI HEAD FINDINGS  Calvarium and upper cervical spine: Ankylosing spondylitis with diffuse fusion of the visualized cervical spine. 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  Remote right craniotomy for subdural hematoma.  Orbits: No significant findings.  Sinuses and Mastoids: Clear. Mastoid and middle ears are clear.  Brain: Cluster of acute nonhemorrhagic infarcts in the left corona radiata, central MCA territory, measuring up to 3 mm individually. There is also a punctate cortical infarct on the lower left precentral gyrus.  Encephalomalacia and gliosis along the superficial right temporal lobe correlating with previous traumatic injury. Hemosiderin staining also seen along the superior folia of the cerebellum.  Generalized atrophy, which likely accounts for ventriculomegaly. Diffuse ischemic gliosis throughout the bilateral cerebral  white matter, confluent around the lateral ventricles.  MRA HEAD FINDINGS  Left posterior communicating artery present.  Symmetric vertebral arteries. The right AICA and left PICA are dominant.  Moderate mid basilar stenosis from eccentric filling defect consistent with bulky atherosclerotic plaque. High-grade left proximal P2 stenosis. High-grade fairly long segment stenosis of the first branch right PCA.  Symmetric carotid arteries with patent siphons. Bilateral high-grade M1 stenosis, present distally and after first branching. There is a relative paucity of left opercular particular branches, with occluded left operculum branch - upper division, demarcated on series 7, image 19.  IMPRESSION: 1. Cluster of small acute infarcts in the central left MCA territory white matter and cortex. 2. High-grade bilateral distal M1 segment stenosis and left M2 branch occlusion (which places additional brain at risk of infarction). 3. Extensive posterior circulation atherosclerotic disease with moderate basilar and high-grade bilateral  proximal PCA stenoses. 4. Atrophy and chronic small vessel disease. 5. Ankylosing spondylitis with diffuse cervical ankylosis. 6. Remote right temporal contusion.   Electronically Signed   By: Monte Fantasia M.D.   On: 11/04/2014 07:55   Mr Jodene Nam Head/brain Wo Cm  11/04/2014   CLINICAL DATA:  TIA with slurred speech and expressive aphasia.  EXAM: MRI HEAD WITHOUT CONTRAST  MRA HEAD WITHOUT CONTRAST  TECHNIQUE: Multiplanar, multiecho pulse sequences of the brain and surrounding structures were obtained without intravenous contrast. Angiographic images of the head were obtained using MRA technique without contrast.  COMPARISON:  Head CT from earlier today  FINDINGS: MRI HEAD FINDINGS  Calvarium and upper cervical spine: Ankylosing spondylitis with diffuse fusion of the visualized cervical spine. 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  Remote right craniotomy for subdural hematoma.  Orbits: No significant findings.  Sinuses and Mastoids: Clear. Mastoid and middle ears are clear.  Brain: Cluster of acute nonhemorrhagic infarcts in the left corona radiata, central MCA territory, measuring up to 3 mm individually. There is also a punctate cortical infarct on the lower left precentral gyrus.  Encephalomalacia and gliosis along the superficial right temporal lobe correlating with previous traumatic injury. Hemosiderin staining also seen along the superior folia of the cerebellum.  Generalized atrophy, which likely accounts for ventriculomegaly. Diffuse ischemic gliosis throughout the bilateral cerebral white matter, confluent around the lateral ventricles.  MRA HEAD FINDINGS  Left posterior communicating artery present.  Symmetric vertebral arteries. The right AICA and left PICA are dominant.  Moderate mid basilar stenosis from eccentric filling defect consistent with bulky atherosclerotic plaque. High-grade left proximal P2 stenosis. High-grade fairly long segment stenosis of  the first branch right PCA.  Symmetric carotid arteries with patent siphons. Bilateral high-grade M1 stenosis, present distally and after first branching. There is a relative paucity of left opercular particular branches, with occluded left operculum branch - upper division, demarcated on series 7, image 19.  IMPRESSION: 1. Cluster of small acute infarcts in the central left MCA territory white matter and cortex. 2. High-grade bilateral distal M1 segment stenosis and left M2 branch occlusion (which places additional brain at risk of infarction). 3. Extensive posterior circulation atherosclerotic disease with moderate basilar and high-grade bilateral proximal PCA stenoses. 4. Atrophy and chronic small vessel disease. 5. Ankylosing spondylitis with diffuse cervical ankylosis. 6. Remote right temporal contusion.   Electronically Signed   By: Monte Fantasia M.D.   On: 11/04/2014 07:55    Oren Binet, MD  [facility] Pager:[phone]  If 7PM-7AM, please contact night-coverage [email or web address] Password [email or web address] 11/04/2014, 2:42 PM  LOS: 0 days

## 2014-11-04 NOTE — Progress Notes (Signed)
  Echocardiogram 2D Echocardiogram has been performed.  Erica [name] 11/04/2014, 3:02 PM

## 2014-11-04 NOTE — Progress Notes (Signed)
    [facility] has been requested to perform a transesophageal echocardiogram on Erica [name] for stroke.  After careful review of history and examination, the risks and benefits of transesophageal echocardiogram have been explained including risks of esophageal damage, perforation (1:10,000 risk), bleeding, pharyngeal hematoma as well as other potential complications associated with conscious sedation including aspiration, arrhythmia, respiratory failure and death. Alternatives to treatment were discussed, questions were answered. Patient is willing to proceed.   Erlene Quan, PA 11/04/2014 7:27 PM

## 2014-11-04 NOTE — Progress Notes (Signed)
VASCULAR LAB PRELIMINARY  PRELIMINARY  PRELIMINARY  PRELIMINARY  Carotid duple completed.    Preliminary report:  1-39% plaquing.  Vertebral artery flow is antegrade.    Klarisa Barman, RVT 11/04/2014, 2:00 PM
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
## 2014-11-05 ENCOUNTER — Encounter (HOSPITAL_COMMUNITY)
Admission: EM | Disposition: A | Discharge status: Home / Self Care | Payer: Self-pay | Source: Home / Self Care | Attending: Internal Medicine

## 2014-11-05 ENCOUNTER — Inpatient Hospital Stay (HOSPITAL_COMMUNITY): Payer: Medicare HMO

## 2014-11-05 ENCOUNTER — Ambulatory Visit (HOSPITAL_COMMUNITY): Payer: Medicare HMO

## 2014-11-05 ENCOUNTER — Encounter (HOSPITAL_COMMUNITY): Payer: Self-pay | Admitting: *Deleted

## 2014-11-05 DIAGNOSIS — I10 Essential (primary) hypertension: Secondary | ICD-10-CM

## 2014-11-05 DIAGNOSIS — F418 Other specified anxiety disorders: Secondary | ICD-10-CM

## 2014-11-05 DIAGNOSIS — I639 Cerebral infarction, unspecified: Secondary | ICD-10-CM

## 2014-11-05 HISTORY — PX: TEE WITHOUT CARDIOVERSION: SHX5443

## 2014-11-05 HISTORY — PX: EP IMPLANTABLE DEVICE: SHX172B

## 2014-11-05 LAB — CBC
HEMATOCRIT: 45.2 % (ref 36.0–46.0)
HEMOGLOBIN: 15.1 g/dL — AB (ref 12.0–15.0)
MCH: 29.2 pg (ref 26.0–34.0)
MCHC: 33.4 g/dL (ref 30.0–36.0)
MCV: 87.3 fL (ref 78.0–100.0)
Platelets: 239 10*3/uL (ref 150–400)
RBC: 5.18 MIL/uL — AB (ref 3.87–5.11)
RDW: 14.9 % (ref 11.5–15.5)
WBC: 7 10*3/uL (ref 4.0–10.5)

## 2014-11-05 LAB — BASIC METABOLIC PANEL
Anion gap: 9 (ref 5–15)
BUN: 13 mg/dL (ref 6–20)
CHLORIDE: 107 mmol/L (ref 101–111)
CO2: 22 mmol/L (ref 22–32)
CREATININE: 0.59 mg/dL (ref 0.44–1.00)
Calcium: 8.4 mg/dL — ABNORMAL LOW (ref 8.9–10.3)
GFR calc Af Amer: >60 mL/min (ref >60)
GFR calc non Af Amer: >60 mL/min (ref >60)
GLUCOSE: 113 mg/dL — AB (ref 65–99)
POTASSIUM: 3.7 mmol/L (ref 3.5–5.1)
Sodium: 138 mmol/L (ref 135–145)

## 2014-11-05 LAB — HEMOGLOBIN A1C
HEMOGLOBIN A1C: 6.4 % — AB (ref 4.8–5.6)
MEAN PLASMA GLUCOSE: 137 mg/dL

## 2014-11-05 SURGERY — LOOP RECORDER INSERTION
Anesthesia: LOCAL

## 2014-11-05 SURGERY — ECHOCARDIOGRAM, TRANSESOPHAGEAL
Anesthesia: Moderate Sedation

## 2014-11-05 MED ORDER — FENTANYL CITRATE (PF) 100 MCG/2ML IJ SOLN
INTRAMUSCULAR | Status: DC | PRN
  Administered 2014-11-05 (×2): 25 ug via INTRAVENOUS

## 2014-11-05 MED ORDER — HYDRALAZINE HCL 20 MG/ML IJ SOLN
INTRAMUSCULAR | Status: AC
  Filled 2014-11-05: qty 1

## 2014-11-05 MED ORDER — SODIUM CHLORIDE 0.9 % IV SOLN
INTRAVENOUS | Status: DC
  Administered 2014-11-05: 12:00:00 via INTRAVENOUS

## 2014-11-05 MED ORDER — FENTANYL CITRATE (PF) 100 MCG/2ML IJ SOLN
INTRAMUSCULAR | Status: AC
  Filled 2014-11-05: qty 2

## 2014-11-05 MED ORDER — LIDOCAINE-EPINEPHRINE 1 %-1:100000 IJ SOLN
INTRAMUSCULAR | Status: DC | PRN
  Administered 2014-11-05: 10 mL

## 2014-11-05 MED ORDER — MIDAZOLAM HCL 10 MG/2ML IJ SOLN
INTRAMUSCULAR | Status: DC | PRN
  Administered 2014-11-05 (×2): 2 mg via INTRAVENOUS

## 2014-11-05 MED ORDER — BUTAMBEN-TETRACAINE-BENZOCAINE 2-2-14 % EX AERO
INHALATION_SPRAY | CUTANEOUS | Status: DC | PRN
Start: 2014-11-05 — End: 2014-11-05
  Administered 2014-11-05: 2 via TOPICAL

## 2014-11-05 MED ORDER — LIDOCAINE-EPINEPHRINE 1 %-1:100000 IJ SOLN
INTRAMUSCULAR | Status: AC
  Filled 2014-11-05: qty 1

## 2014-11-05 MED ORDER — MIDAZOLAM HCL 5 MG/ML IJ SOLN
INTRAMUSCULAR | Status: AC
  Filled 2014-11-05: qty 2

## 2014-11-05 MED ORDER — DIPHENHYDRAMINE HCL 50 MG/ML IJ SOLN
INTRAMUSCULAR | Status: AC
  Filled 2014-11-05: qty 1

## 2014-11-05 SURGICAL SUPPLY — 2 items
LOOP REVEAL LINQSYS (Prosthesis & Implant Heart) ×1 IMPLANT
PACK LOOP INSERTION (CUSTOM PROCEDURE TRAY) ×2 IMPLANT
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
## 2014-11-05 NOTE — Consult Note (Signed)
ELECTROPHYSIOLOGY CONSULT NOTE  Patient ID: Erica [name] MRN: [id number], DOB/AGE: February 27, 1934   Admit date: 11/03/2014 Date of Consult: 11/05/2014  Primary Physician: Woody Seller, MD Primary Cardiologist: New (Nasher performed TEE) Reason for Consultation: Cryptogenic stroke; recommendations regarding Implantable Loop Recorder  History of Present Illness Erica [name] was admitted on 11/03/2014 with acute CVA. she has been monitored on telemetry which has demonstrated no arrhythmias. No cause has been identified. Inpatient stroke work-up is to be completed with a TEE. EP has been asked to evaluate for placement of an implantable loop recorder to monitor for atrial fibrillation.  Past Medical History Past Medical History  Diagnosis Date  . Spondylitis, ankylosing   . Anxiety     h/o curious anxiety   . Arthritis     osteoporosis, ankylosing spondylosis   . Cancer     basal cell- facial   . History of blood transfusion     for back surgery  . Osteoporosis     Past Surgical History Past Surgical History  Procedure Laterality Date  . Abdominal hysterectomy    . Hardware removal N/A 10/23/2013    Procedure: HARDWARE REMOVAL;  Surgeon: Kristeen Miss, MD;  Location: [place] [facility];  Service: Neurosurgery;  Laterality: N/A;  HARDWARE REMOVAL  . Craniotomy Right 10/06/2013    Procedure: CRANIECTOMY HEMATOMA EVACUATION SUBDURAL, PLACEMENT OF SKULL FLAP IN ABDOMEN;  Surgeon: Kristeen Miss, MD;  Location: [place] [facility];  Service: Neurosurgery;  Laterality: Right;  . Posterior lumbar fusion N/A 10/06/2013    Procedure: POSTERIOR LUMBAR FUSION  lumbar one/two;  Surgeon: Kristeen Miss, MD;  Location: [place] [facility];  Service: Neurosurgery;  Laterality: N/A;  . Cranioplasty N/A 01/11/2014    Procedure: CRANIOPLASTY FROM FLAP IN ABDOMEN;  Surgeon: Kristeen Miss, MD;  Location: [place] [facility];  Service: Neurosurgery;  Laterality: N/A;    Allergies/Intolerances Allergies  Allergen Reactions  . Codeine  Nausea Only   Inpatient Medications .  stroke: mapping our early stages of recovery book   Does not apply Once  .  stroke: mapping our early stages of recovery book   Does not apply Once  . aspirin  325 mg Oral Daily   Or  . aspirin  300 mg Rectal Daily  . clopidogrel  75 mg Oral Daily  . heparin  5,000 Units Subcutaneous 3 times per day   . sodium chloride 20 mL/hr at 11/05/14 1229   Social History Social History   Social History  . Marital Status: Legally Separated    Spouse Name: N/A  . Number of Children: N/A  . Years of Education: N/A   Occupational History  . Not on file.   Social History Main Topics  . Smoking status: Never Smoker   . Smokeless tobacco: Not on file  . Alcohol Use: No  . Drug Use: No  . Sexual Activity: Not on file   Other Topics Concern  . Not on file   Social History Narrative    Review of Systems General: No chills, fever, night sweats or weight changes  Cardiovascular:  No chest pain, dyspnea on exertion, edema, orthopnea, palpitations, paroxysmal nocturnal dyspnea Dermatological: No rash, lesions or masses Respiratory: No cough, dyspnea Urologic: No hematuria, dysuria Abdominal: No nausea, vomiting, diarrhea, bright red blood per rectum, melena, or hematemesis Neurologic: No visual changes, weakness, changes in mental status All other systems reviewed and are otherwise negative except as noted above.  Physical Exam Blood pressure 167/72, pulse 99, temperature 98.5 F (  36.9 C), temperature source Oral, resp. rate 15, height 4\' 11"  (1.499 m), weight 128 lb (58.06 kg), SpO2 97 %.  General: Well developed, well appearing 81 y.o. female in no acute distress. HEENT: Normocephalic, atraumatic. EOMs intact. Sclera nonicteric. Oropharynx clear.  Neck: Supple without bruits. No JVD. Lungs: Respirations regular and unlabored, CTA bilaterally. No wheezes, rales or rhonchi. Heart: RRR. S1, S2 present. No murmurs, rub, S3 or S4. Abdomen: Soft,  non-tender, non-distended. BS present x 4 quadrants. No hepatosplenomegaly.  Extremities: No clubbing, cyanosis or edema. DP/PT/Radials 2+ and equal bilaterally. Psych: Normal affect. Neuro: Alert and oriented X 3. Moves all extremities spontaneously. Musculoskeletal: No kyphosis. Skin: Intact. Warm and dry. No rashes or petechiae in exposed areas.   Labs Lab Results  Component Value Date   WBC 7.0 11/05/2014   HGB 15.1* 11/05/2014   HCT 45.2 11/05/2014   MCV 87.3 11/05/2014   PLT 239 11/05/2014    Recent Labs Lab 11/03/14 1953 11/05/14 0551  NA 142 138  K 4.3 3.7  CL 105 107  CO2 29 22  BUN 12 13  CREATININE 0.66 0.59  CALCIUM 9.2 8.4*  PROT 6.6  --   BILITOT 0.7  --   ALKPHOS 64  --   ALT 52  --   AST 34  --   GLUCOSE 107* 113*    Recent Labs  11/03/14 1633  INR 0.96    Radiology/Studies Ct Head Wo Contrast  11/03/2014   CLINICAL DATA:  TIA, evaluate for bleed  EXAM: CT HEAD WITHOUT CONTRAST  TECHNIQUE: Contiguous axial images were obtained from the base of the skull through the vertex without intravenous contrast.  COMPARISON:  10/06/2013  FINDINGS: The patient is status post right frontoparietal craniotomy. No intracranial hemorrhage, mass effect or midline shift. Paranasal sinuses and mastoid air cells are unremarkable. There is no acute cortical infarction. Moderate cerebral atrophy. Moderate periventricular and patchy subcortical white matter decreased attenuation probable due to chronic small vessel ischemic changes. There is encephalomalacia in right temporal lobe anteriorly probable sequela from prior hemorrhage. No mass lesion is noted on this unenhanced scan.  IMPRESSION: No acute intracranial abnormality. There is no acute cortical infarction. Moderate cerebral atrophy. Moderate periventricular and patchy subcortical white matter decreased attenuation probable due to chronic small vessel ischemic changes. There is encephalomalacia in right temporal lobe  anteriorly probable sequela from prior hemorrhage.   Electronically Signed   By: Lahoma Crocker M.D.   On: 11/03/2014 16:06   Mr Brain Wo Contrast  11/04/2014   CLINICAL DATA:  TIA with slurred speech and expressive aphasia.  EXAM: MRI HEAD WITHOUT CONTRAST  MRA HEAD WITHOUT CONTRAST  TECHNIQUE: Multiplanar, multiecho pulse sequences of the brain and surrounding structures were obtained without intravenous contrast. Angiographic images of the head were obtained using MRA technique without contrast.  COMPARISON:  Head CT from earlier today  FINDINGS: MRI HEAD FINDINGS  Calvarium and upper cervical spine: Ankylosing spondylitis with diffuse fusion of the visualized cervical spine. 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  Remote right craniotomy for subdural hematoma.  Orbits: No significant findings.  Sinuses and Mastoids: Clear. Mastoid and middle ears are clear.  Brain: Cluster of acute nonhemorrhagic infarcts in the left corona radiata, central MCA territory, measuring up to 3 mm individually. There is also a punctate cortical infarct on the lower left precentral gyrus.  Encephalomalacia and gliosis along the superficial right temporal lobe correlating with previous traumatic injury. Hemosiderin staining  also seen along the superior folia of the cerebellum.  Generalized atrophy, which likely accounts for ventriculomegaly. Diffuse ischemic gliosis throughout the bilateral cerebral white matter, confluent around the lateral ventricles.  MRA HEAD FINDINGS  Left posterior communicating artery present.  Symmetric vertebral arteries. The right AICA and left PICA are dominant.  Moderate mid basilar stenosis from eccentric filling defect consistent with bulky atherosclerotic plaque. High-grade left proximal P2 stenosis. High-grade fairly long segment stenosis of the first branch right PCA.  Symmetric carotid arteries with patent siphons. Bilateral high-grade M1 stenosis, present  distally and after first branching. There is a relative paucity of left opercular particular branches, with occluded left operculum branch - upper division, demarcated on series 7, image 19.  IMPRESSION: 1. Cluster of small acute infarcts in the central left MCA territory white matter and cortex. 2. High-grade bilateral distal M1 segment stenosis and left M2 branch occlusion (which places additional brain at risk of infarction). 3. Extensive posterior circulation atherosclerotic disease with moderate basilar and high-grade bilateral proximal PCA stenoses. 4. Atrophy and chronic small vessel disease. 5. Ankylosing spondylitis with diffuse cervical ankylosis. 6. Remote right temporal contusion.   Electronically Signed   By: Monte Fantasia M.D.   On: 11/04/2014 07:55   Mr Jodene Nam Head/brain Wo Cm  11/04/2014   CLINICAL DATA:  TIA with slurred speech and expressive aphasia.  EXAM: MRI HEAD WITHOUT CONTRAST  MRA HEAD WITHOUT CONTRAST  TECHNIQUE: Multiplanar, multiecho pulse sequences of the brain and surrounding structures were obtained without intravenous contrast. Angiographic images of the head were obtained using MRA technique without contrast.  COMPARISON:  Head CT from earlier today  FINDINGS: MRI HEAD FINDINGS  Calvarium and upper cervical spine: Ankylosing spondylitis with diffuse fusion of the visualized cervical spine. 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  Remote right craniotomy for subdural hematoma.  Orbits: No significant findings.  Sinuses and Mastoids: Clear. Mastoid and middle ears are clear.  Brain: Cluster of acute nonhemorrhagic infarcts in the left corona radiata, central MCA territory, measuring up to 3 mm individually. There is also a punctate cortical infarct on the lower left precentral gyrus.  Encephalomalacia and gliosis along the superficial right temporal lobe correlating with previous traumatic injury. Hemosiderin staining also seen along the  superior folia of the cerebellum.  Generalized atrophy, which likely accounts for ventriculomegaly. Diffuse ischemic gliosis throughout the bilateral cerebral white matter, confluent around the lateral ventricles.  MRA HEAD FINDINGS  Left posterior communicating artery present.  Symmetric vertebral arteries. The right AICA and left PICA are dominant.  Moderate mid basilar stenosis from eccentric filling defect consistent with bulky atherosclerotic plaque. High-grade left proximal P2 stenosis. High-grade fairly long segment stenosis of the first branch right PCA.  Symmetric carotid arteries with patent siphons. Bilateral high-grade M1 stenosis, present distally and after first branching. There is a relative paucity of left opercular particular branches, with occluded left operculum branch - upper division, demarcated on series 7, image 19.  IMPRESSION: 1. Cluster of small acute infarcts in the central left MCA territory white matter and cortex. 2. High-grade bilateral distal M1 segment stenosis and left M2 branch occlusion (which places additional brain at risk of infarction). 3. Extensive posterior circulation atherosclerotic disease with moderate basilar and high-grade bilateral proximal PCA stenoses. 4. Atrophy and chronic small vessel disease. 5. Ankylosing spondylitis with diffuse cervical ankylosis. 6. Remote right temporal contusion.   Electronically Signed   By: Monte Fantasia M.D.   On: 11/04/2014  07:55    Echocardiogram  TEE Left Ventrical: Normal LV function, EF 60-65 %, mild - mod VLH  Mitral Valve: mild MR   Aortic Valve: normal AV  Tricuspid Valve: trivial TR  Pulmonic Valve: normal   Left Atrium/ Left atrial appendage: no thrombi  Atrial septum: no PFO or ASD by color flow or bubble contrast   Aorta: mild calcification    12-lead ECG SR Telemetry SR    Assessment and Plan 1. Cryptogenic stroke   Since the TEE was negative, we recommend loop recorder insertion to  monitor for AF. The indication for loop recorder insertion / monitoring for AF in setting of cryptogenic stroke was discussed with the patient. The loop recorder insertion procedure was reviewed in detail including risks and benefits. These risks include but are not limited to bleeding and infection. The patient expressed verbal understanding and agrees to proceed. The patient was also counseled regarding wound care and device follow-up.  SignedLyda Jester 11/05/2014, 3:55 PM   I have seen and examined this patient with Lyda Jester.  Agree with above, note added to reflect my findings.  On exam, regular rhythm, no murmurs.  Patient with cryptogenic stroke presenting with word finding difficulty.  Had MRI showing stroke and TEE without evidence of clot.  Sean Macwilliams plan for LINQ to evaluate for atrial fibrillation.    Makiyah Zentz M. Jaylon Grode MD 11/05/2014 5:31 PM

## 2014-11-05 NOTE — Progress Notes (Signed)
Transport called- pt getting another procedure; advised EEG will be done tomorrow.

## 2014-11-05 NOTE — Progress Notes (Signed)
Pt reports a 3-5 minute episode of aphasia as she was laying in bed being transported back to room from cath lab. Pt is presently verbal and states she still feels her speech is "a little off." MD notified. Will continue to monitor.

## 2014-11-05 NOTE — Interval H&P Note (Signed)
History and Physical Interval Note:  11/05/2014 1:17 PM  Erica [name]  has presented today for surgery, with the diagnosis of stroke  The various methods of treatment have been discussed with the patient and family. After consideration of risks, benefits and other options for treatment, the patient has consented to  Procedure(s): TRANSESOPHAGEAL ECHOCARDIOGRAM (TEE) (N/A) as a surgical intervention .  The patient's history has been reviewed, patient examined, no change in status, stable for surgery.  I have reviewed the patient's chart and labs.  Questions were answered to the patient's satisfaction.     Nahser, Wonda Cheng
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
## 2014-11-05 NOTE — CV Procedure (Signed)
    Transesophageal Echocardiogram Note  Erica [name] [id number] 26-Jun-1933  Procedure: Transesophageal Echocardiogram Indications: CVA  Procedure Details Consent: Obtained Time Out: Verified patient identification, verified procedure, site/side was marked, verified correct patient position, special equipment/implants available, Radiology Safety Procedures followed,  medications/allergies/relevent history reviewed, required imaging and test results available.  Performed  Medications: Fentanyl: 50 mcg iv  Versed: 4 mg iv   Left Ventrical:  Normal LV function,  EF 60-65 %, mild - mod VLH  Mitral Valve: mild MR   Aortic Valve: normal AV  Tricuspid Valve: trivial TR  Pulmonic Valve: normal   Left Atrium/ Left atrial appendage: no thrombi  Atrial septum: no PFO or ASD by color flow or bubble contrast   Aorta: mild calcification    Complications: No apparent complications Patient did tolerate procedure well.   Thayer Headings, Brooke Bonito., MD, [facility] 11/05/2014, 1:32 PM
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
## 2014-11-05 NOTE — Progress Notes (Signed)
STROKE TEAM PROGRESS NOTE   SUBJECTIVE (INTERVAL HISTORY) The patient is scheduled for a TEE and possible loop today. She is feeling better today. Dr. Erlinda Hong answered questions from the patient's daughter as well as the patient. The patient is opposed to statin therapy. She has no events overnight.   OBJECTIVE Temp:  [97.5 F (36.4 C)-98.6 F (37 C)] 98.3 F (36.8 C) (09/09 1221) Pulse Rate:  [78-119] 103 (09/09 1335) Cardiac Rhythm:  [-] Normal sinus rhythm (09/09 0827) Resp:  [14-25] 18 (09/09 1335) BP: (162-233)/(77-162) 175/77 mmHg (09/09 1335) SpO2:  [94 %-98 %] 94 % (09/09 1335)  No results for input(s): GLUCAP in the last 168 hours.  Recent Labs Lab 11/03/14 1633 11/03/14 1634 11/03/14 1953 11/05/14 0551  NA 141 142 142 138  K 4.0 3.9 4.3 3.7  CL 105 103 105 107  CO2 25  --  29 22  GLUCOSE 106* 105* 107* 113*  BUN 13 17 12 13   CREATININE 0.66 0.70 0.66 0.59  CALCIUM 9.2  --  9.2 8.4*    Recent Labs Lab 11/03/14 1633 11/03/14 1953  AST 39 34  ALT 55* 52  ALKPHOS 73 64  BILITOT 0.6 0.7  PROT 7.0 6.6  ALBUMIN 4.2 3.7    Recent Labs Lab 11/03/14 1633 11/03/14 1634 11/03/14 1953 11/05/14 0551  WBC 8.1  --  7.7 7.0  NEUTROABS 5.5  --   --   --   HGB 15.8* 17.0* 14.6 15.1*  HCT 47.0* 50.0* 43.9 45.2  MCV 87.0  --  86.8 87.3  PLT 252  --  252 239   No results for input(s): CKTOTAL, CKMB, CKMBINDEX, TROPONINI in the last 168 hours.  Recent Labs  11/03/14 1633  LABPROT 13.0  INR 0.96    Recent Labs  11/03/14 1638  COLORURINE YELLOW  LABSPEC 1.006  PHURINE 7.5  GLUCOSEU NEGATIVE  HGBUR NEGATIVE  BILIRUBINUR NEGATIVE  KETONESUR NEGATIVE  PROTEINUR NEGATIVE  UROBILINOGEN 0.2  NITRITE NEGATIVE  LEUKOCYTESUR NEGATIVE       Component Value Date/Time   CHOL 181 11/04/2014 0715   TRIG 153* 11/04/2014 0715   HDL 41 11/04/2014 0715   CHOLHDL 4.4 11/04/2014 0715   VLDL 31 11/04/2014 0715   LDLCALC 109* 11/04/2014 0715   Lab Results   Component Value Date   HGBA1C 6.4* 11/04/2014      Component Value Date/Time   LABOPIA NONE DETECTED 11/03/2014 1637   COCAINSCRNUR NONE DETECTED 11/03/2014 1637   LABBENZ NONE DETECTED 11/03/2014 1637   AMPHETMU NONE DETECTED 11/03/2014 1637   THCU NONE DETECTED 11/03/2014 1637   LABBARB NONE DETECTED 11/03/2014 1637     Recent Labs Lab 11/03/14 1953  ETH <5    I have personally reviewed the radiological images below and agree with the radiology interpretations.  Ct Head Wo Contrast 11/03/2014    No acute intracranial abnormality. There is no acute cortical infarction. Moderate cerebral atrophy. Moderate periventricular and patchy subcortical white matter decreased attenuation probable due to chronic small vessel ischemic changes. There is encephalomalacia in right temporal lobe anteriorly probable sequela from prior hemorrhage.    Mri and Mra Brain Wo Contrast  11/04/2014   1. Cluster of small acute infarcts in the central left MCA territory white matter and cortex. 2. High-grade bilateral distal M1 segment stenosis and left M2 branch occlusion (which places additional brain at risk of infarction). 3. Extensive posterior circulation atherosclerotic disease with moderate basilar and high-grade bilateral proximal PCA stenoses. 4. Atrophy  and chronic small vessel disease. 5. Ankylosing spondylitis with diffuse cervical ankylosis. 6. Remote right temporal contusion.   Carotid Doppler  Bilateral: 1-39% ICA stenosis. Vertebral artery flow is antegrade.  2D Echocardiogram  - Left ventricle: The cavity size was normal. Wall thickness was increased in a pattern of moderate LVH. Systolic function was normal. The estimated ejection fraction was in the range of 55% to 60%. Wall motion was normal; there were no regional wall motion abnormalities. Doppler parameters are consistent with abnormal left ventricular relaxation (grade 1 diastolic dysfunction). - Mitral valve: Calcified  annulus. There was mild regurgitation.  EKG  normal sinus rhythm. For complete results please see formal report.  TEE 11/05/2014 Left Ventrical: Normal LV function, EF 60-65 %, mild - mod VLH Mitral Valve: mild MR  Aortic Valve: normal AV Tricuspid Valve: trivial TR Pulmonic Valve: normal  Left Atrium/ Left atrial appendage: no thrombi Atrial septum: no PFO or ASD by color flow or bubble contrast  Aorta: mild calcification  Complications: No apparent complications Patient did tolerate procedure well.  EEG pending  PHYSICAL EXAM  Temp:  [97.5 F (36.4 C)-98.6 F (37 C)] 98.3 F (36.8 C) (09/09 1221) Pulse Rate:  [78-119] 103 (09/09 1335) Resp:  [14-25] 18 (09/09 1335) BP: (162-233)/(77-162) 175/77 mmHg (09/09 1335) SpO2:  [94 %-98 %] 94 % (09/09 1335)  General - Well nourished, well developed, in no apparent distress.  Ophthalmologic - Sharp disc margins OU.  Cardiovascular - Regular rate and rhythm with no murmur.  Neck - supple, no carotid bruits  Mental Status -  Level of arousal and orientation to time, place, and person were intact. Language including expression, naming, repetition, comprehension was assessed and found intact. Fund of Knowledge was assessed and was intact.  Cranial Nerves II - XII - II - Visual field intact OU. III, IV, VI - Extraocular movements intact. V - Facial sensation intact bilaterally. VII - Facial movement intact bilaterally. VIII - Hearing & vestibular intact bilaterally. X - Palate elevates symmetrically. XI - Chin turning & shoulder shrug intact bilaterally. XII - Tongue protrusion intact.  Motor Strength - The patient's strength was normal in all extremities and pronator drift was absent.  Bulk was normal and fasciculations were absent.   Motor Tone - Muscle tone was assessed at the neck and appendages and was normal.  Reflexes - The patient's reflexes were symmetrical in all extremities and she had no pathological  reflexes.  Sensory - Light touch, temperature/pinprick were assessed and were symmetrical.    Coordination - The patient had normal movements in the hands and feet with no ataxia or dysmetria.  Tremor was absent.  Gait and Station - deferred due to safety concerns.   ASSESSMENT/PLAN Erica [name] is a 81 y.o. female with history of anxiety and arthritis admitted for episodic aphasia. Symptoms resolved.    Stroke:  Dominant left patchy left MCA territory infarct embolic secondary to unclear source  MRI  left patchy MCA territory infarct  MRA  left M2 occlusion, left P2 stenosis, bilateral distal MCA branches stenosis  Carotid Doppler  unremarkable  2D Echo  Unremarkable  TEE - unremarkable, no PFO  LDL 109  EEG - Pending  HgbA1c 6.4  SCDs for VTE prophylaxis  Diet NPO time specified   no antithrombotic prior to admission, now on aspirin 325 mg orally every day and clopidogrel 75 mg orally every day. Due to intracranial stenosis, recommend dual antiplatelets for 3 months and then Plavix alone.  Recommend loop recorder placement to rule out afib  Patient counseled to be compliant with her antithrombotic medications  Ongoing aggressive stroke risk factor management  Therapy recommendations:  No OT follow-up recommended. Intermittent supervision needed. PT eval pending.  Disposition:  Pending  Hypertension Home meds none Permissive hypertension (OK if <220/120) for 24-48 hours post stroke and then gradually normalized within 5-7 days.  Bp at high side   Avoid hypotension due to left M2 occlusion  Hyperlipidemia  Home meds:  Fish oil   Currently on none  LDL 109, goal < 70  Patient refused statin  Other Stroke Risk Factors  Advanced age  PLAN  Follow-up with Dr. Erlinda Hong in one month  Possible Respect Esus trial candidate.  Dual antiplatelets therapy 3 months and Plavix alone  Avoid hypotension  EEG - pending  Loop recorder placement  recommended  Hospital day # 1  Rosalin Hawking, MD PhD Stroke Neurology 11/05/2014 10:33 PM  To contact Stroke Continuity provider, please refer to [email or web address] After hours, contact General Neurology

## 2014-11-05 NOTE — Progress Notes (Signed)
  Echocardiogram 2D Echocardiogram has been performed.  Erica [name] M 11/05/2014, 1:52 PM

## 2014-11-06 ENCOUNTER — Inpatient Hospital Stay (HOSPITAL_COMMUNITY)
Admit: 2014-11-06 | Discharge: 2014-11-06 | Disposition: A | Discharge status: Home / Self Care | Payer: Medicare HMO | Attending: Physician Assistant | Admitting: Physician Assistant

## 2014-11-06 MED ORDER — ASPIRIN 325 MG PO TABS: 325.0000 mg | ORAL_TABLET | Freq: Every day | ORAL | Status: DC

## 2014-11-06 MED ORDER — CLOPIDOGREL BISULFATE 75 MG PO TABS: 75.0000 mg | ORAL_TABLET | Freq: Every day | ORAL | Status: AC

## 2014-11-06 NOTE — Procedures (Signed)
ELECTROENCEPHALOGRAM REPORT   Patient: Erica [name]      Room #: 27M-03 Age: 81 y.o.        Sex: female Referring Physician: Dr Erlinda Hong Report Date:  11/06/2014        Interpreting Physician: Hulen Luster  History: Erica [name] is an 81 y.o. female admitted with speech deficits  Medications:  Scheduled: .  stroke: mapping our early stages of recovery book   Does not apply Once  .  stroke: mapping our early stages of recovery book   Does not apply Once  . aspirin  325 mg Oral Daily   Or  . aspirin  300 mg Rectal Daily  . clopidogrel  75 mg Oral Daily  . heparin  5,000 Units Subcutaneous 3 times per day    Conditions of Recording:  This is a 16 channel EEG carried out with the patient in the awake state.  Description:  The waking background activity consists of a low voltage, symmetrical, fairly well organized, 9-11 Hz alpha activity, seen from the parieto-occipital and posterior temporal regions. No focal slowing or epileptiform activity noted.   Hyperventilation was not performed. Intermittent photic stimulation was not performed.    IMPRESSION: Normal electroencephalogram. There are no focal lateralizing or epileptiform features.   Jim Like, DO [facility] [phone]  If 7pm- 7am, please page neurology on call as listed in [place]. 11/06/2014, 3:53 PM

## 2014-11-06 NOTE — Progress Notes (Signed)
PATIENT DETAILS Name: Erica [name] Age: 81 y.o. Sex: female Date of Birth: February 12, 1934 Admit Date: 11/03/2014 Admitting Physician Thayer Headings, MD NTZ:[id number],SWHQ HENRY, MD  Subjective: Speech clear this morning. Had another episode of mild dysarthria yesterday evening following a loop recorder implantation.  Assessment/Plan: Principal Problem: Acute CVA: MRI brain showed cluster of small acute infarcts in the left MCA territory, carotid Doppler negative for significant stenosis. 2-D echocardiogram showed preserved ejection fraction without any embolic foci. Since suspicion for embolic CVA, underwent a TEE which was negative, subsequently underwent a loop recorder implantation. On dual antiplatelets therapy with aspirin and Plavix, who refuses to be on statins. Spoke with neurology, await EEG, no further recommendations if EEG is negative.  A1c 6.4, DL 109 (goal Less than 70)-does not want to start statins, she will like to try exercise/dietary modification.   Active Problems: History of  Traumatic closed displaced fracture of lumbar spine with incomplete lesion of spinal cordAnd Subdural hematoma: Requiring craniotomy, lumbar fusion in August 2015. Currently stable.   Disposition: Remain inpatient-suspect home later today.  Antimicrobial agents  See below  Anti-infectives    None      DVT Prophylaxis: Prophylactic Heparin   Code Status: Full code   Family Communication None at bedside  Procedures: None  CONSULTS:  neurology  Time spent 25 minutes-Greater than 50% of this time was spent in counseling, explanation of diagnosis, planning of further management, and coordination of care.  MEDICATIONS: Scheduled Meds: .  stroke: mapping our early stages of recovery book   Does not apply Once  .  stroke: mapping our early stages of recovery book   Does not apply Once  . aspirin  325 mg Oral Daily   Or  . aspirin  300 mg Rectal Daily  . clopidogrel   75 mg Oral Daily  . heparin  5,000 Units Subcutaneous 3 times per day   Continuous Infusions:   PRN Meds:.acetaminophen **OR** acetaminophen, hydrALAZINE    PHYSICAL EXAM: Vital signs in last 24 hours: Filed Vitals:   11/05/14 2150 11/06/14 0224 11/06/14 0550 11/06/14 1016  BP: 141/71 145/74 163/95 176/86  Pulse: 92 95 68 96  Temp: 99.1 F (37.3 C) 98.9 F (37.2 C) 98.4 F (36.9 C) 98 F (36.7 C)  TempSrc: Oral Oral Oral Oral  Resp: 16 15 18 17   Height:      Weight:      SpO2: 96% 97% 94% 97%    Weight change:  Filed Weights   11/04/14 0949  Weight: 58.06 kg (128 lb)   Body mass index is 25.84 kg/(m^2).   Gen Exam: Awake and alert   Neck: Supple, No JVD.   Chest: B/L Clear.   CVS: S1 S2 Regular, no murmurs.  Abdomen: soft, BS +, non tender, non distended.  Extremities: no edema, lower extremities warm to touch. Neurologic: Non Focal.   Skin: No Rash.  Wounds: N/A.    Intake/Output from previous day: No intake or output data in the 24 hours ending 11/06/14 1113   LAB RESULTS: CBC  Recent Labs Lab 11/03/14 1633 11/03/14 1634 11/03/14 1953 11/05/14 0551  WBC 8.1  --  7.7 7.0  HGB 15.8* 17.0* 14.6 15.1*  HCT 47.0* 50.0* 43.9 45.2  PLT 252  --  252 239  MCV 87.0  --  86.8 87.3  MCH 29.3  --  28.9 29.2  MCHC 33.6  --  33.3 33.4  RDW 14.9  --  15.0 14.9  LYMPHSABS 1.8  --   --   --   MONOABS 0.6  --   --   --   EOSABS 0.1  --   --   --   BASOSABS 0.1  --   --   --     Chemistries   Recent Labs Lab 11/03/14 1633 11/03/14 1634 11/03/14 1953 11/05/14 0551  NA 141 142 142 138  K 4.0 3.9 4.3 3.7  CL 105 103 105 107  CO2 25  --  29 22  GLUCOSE 106* 105* 107* 113*  BUN 13 17 12 13   CREATININE 0.66 0.70 0.66 0.59  CALCIUM 9.2  --  9.2 8.4*    CBG: No results for input(s): GLUCAP in the last 168 hours.  GFR Estimated Creatinine Clearance: 45 mL/min (by C-G formula based on Cr of 0.59).  Coagulation profile  Recent Labs Lab  11/03/14 1633  INR 0.96    Cardiac Enzymes No results for input(s): CKMB, TROPONINI, MYOGLOBIN in the last 168 hours.  Invalid input(s): CK  Invalid input(s): POCBNP No results for input(s): DDIMER in the last 72 hours.  Recent Labs  11/04/14 0715  HGBA1C 6.4*    Recent Labs  11/04/14 0715  CHOL 181  HDL 41  LDLCALC 109*  TRIG 153*  CHOLHDL 4.4    Recent Labs  11/03/14 1953  TSH 2.300   No results for input(s): VITAMINB12, FOLATE, FERRITIN, TIBC, IRON, RETICCTPCT in the last 72 hours. No results for input(s): LIPASE, AMYLASE in the last 72 hours.  Urine Studies No results for input(s): UHGB, CRYS in the last 72 hours.  Invalid input(s): UACOL, UAPR, USPG, UPH, UTP, UGL, UKET, UBIL, UNIT, UROB, ULEU, UEPI, UWBC, URBC, UBAC, CAST, UCOM, BILUA  MICROBIOLOGY: No results found for this or any previous visit (from the past 240 hour(s)).  RADIOLOGY STUDIES/RESULTS: Ct Head Wo Contrast  11/03/2014   CLINICAL DATA:  TIA, evaluate for bleed  EXAM: CT HEAD WITHOUT CONTRAST  TECHNIQUE: Contiguous axial images were obtained from the base of the skull through the vertex without intravenous contrast.  COMPARISON:  10/06/2013  FINDINGS: The patient is status post right frontoparietal craniotomy. No intracranial hemorrhage, mass effect or midline shift. Paranasal sinuses and mastoid air cells are unremarkable. There is no acute cortical infarction. Moderate cerebral atrophy. Moderate periventricular and patchy subcortical white matter decreased attenuation probable due to chronic small vessel ischemic changes. There is encephalomalacia in right temporal lobe anteriorly probable sequela from prior hemorrhage. No mass lesion is noted on this unenhanced scan.  IMPRESSION: No acute intracranial abnormality. There is no acute cortical infarction. Moderate cerebral atrophy. Moderate periventricular and patchy subcortical white matter decreased attenuation probable due to chronic small vessel  ischemic changes. There is encephalomalacia in right temporal lobe anteriorly probable sequela from prior hemorrhage.   Electronically Signed   By: Lahoma Crocker M.D.   On: 11/03/2014 16:06   Mr Brain Wo Contrast  11/04/2014   CLINICAL DATA:  TIA with slurred speech and expressive aphasia.  EXAM: MRI HEAD WITHOUT CONTRAST  MRA HEAD WITHOUT CONTRAST  TECHNIQUE: Multiplanar, multiecho pulse sequences of the brain and surrounding structures were obtained without intravenous contrast. Angiographic images of the head were obtained using MRA technique without contrast.  COMPARISON:  Head CT from earlier today  FINDINGS: MRI HEAD FINDINGS  Calvarium and upper cervical spine: Ankylosing spondylitis with diffuse fusion of the visualized cervical spine. Atlanto occipital  ankylosis with degenerative changes at the open C1-2 articulation.  Remote right craniotomy for subdural hematoma.  Orbits: No significant findings.  Sinuses and Mastoids: Clear. Mastoid and middle ears are clear.  Brain: Cluster of acute nonhemorrhagic infarcts in the left corona radiata, central MCA territory, measuring up to 3 mm individually. There is also a punctate cortical infarct on the lower left precentral gyrus.  Encephalomalacia and gliosis along the superficial right temporal lobe correlating with previous traumatic injury. Hemosiderin staining also seen along the superior folia of the cerebellum.  Generalized atrophy, which likely accounts for ventriculomegaly. Diffuse ischemic gliosis throughout the bilateral cerebral white matter, confluent around the lateral ventricles.  MRA HEAD FINDINGS  Left posterior communicating artery present.  Symmetric vertebral arteries. The right AICA and left PICA are dominant.  Moderate mid basilar stenosis from eccentric filling defect consistent with bulky atherosclerotic plaque. High-grade left proximal P2 stenosis. High-grade fairly long segment stenosis of the first branch right PCA.  Symmetric carotid  arteries with patent siphons. Bilateral high-grade M1 stenosis, present distally and after first branching. There is a relative paucity of left opercular particular branches, with occluded left operculum branch - upper division, demarcated on series 7, image 19.  IMPRESSION: 1. Cluster of small acute infarcts in the central left MCA territory white matter and cortex. 2. High-grade bilateral distal M1 segment stenosis and left M2 branch occlusion (which places additional brain at risk of infarction). 3. Extensive posterior circulation atherosclerotic disease with moderate basilar and high-grade bilateral proximal PCA stenoses. 4. Atrophy and chronic small vessel disease. 5. Ankylosing spondylitis with diffuse cervical ankylosis. 6. Remote right temporal contusion.   Electronically Signed   By: Monte Fantasia M.D.   On: 11/04/2014 07:55   Mr Jodene Nam Head/brain Wo Cm  11/04/2014   CLINICAL DATA:  TIA with slurred speech and expressive aphasia.  EXAM: MRI HEAD WITHOUT CONTRAST  MRA HEAD WITHOUT CONTRAST  TECHNIQUE: Multiplanar, multiecho pulse sequences of the brain and surrounding structures were obtained without intravenous contrast. Angiographic images of the head were obtained using MRA technique without contrast.  COMPARISON:  Head CT from earlier today  FINDINGS: MRI HEAD FINDINGS  Calvarium and upper cervical spine: Ankylosing spondylitis with diffuse fusion of the visualized cervical spine. 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  Remote right craniotomy for subdural hematoma.  Orbits: No significant findings.  Sinuses and Mastoids: Clear. Mastoid and middle ears are clear.  Brain: Cluster of acute nonhemorrhagic infarcts in the left corona radiata, central MCA territory, measuring up to 3 mm individually. There is also a punctate cortical infarct on the lower left precentral gyrus.  Encephalomalacia and gliosis along the superficial right temporal lobe correlating  with previous traumatic injury. Hemosiderin staining also seen along the superior folia of the cerebellum.  Generalized atrophy, which likely accounts for ventriculomegaly. Diffuse ischemic gliosis throughout the bilateral cerebral white matter, confluent around the lateral ventricles.  MRA HEAD FINDINGS  Left posterior communicating artery present.  Symmetric vertebral arteries. The right AICA and left PICA are dominant.  Moderate mid basilar stenosis from eccentric filling defect consistent with bulky atherosclerotic plaque. High-grade left proximal P2 stenosis. High-grade fairly long segment stenosis of the first branch right PCA.  Symmetric carotid arteries with patent siphons. Bilateral high-grade M1 stenosis, present distally and after first branching. There is a relative paucity of left opercular particular branches, with occluded left operculum branch - upper division, demarcated on series 7, image 19.  IMPRESSION: 1. Cluster  of small acute infarcts in the central left MCA territory white matter and cortex. 2. High-grade bilateral distal M1 segment stenosis and left M2 branch occlusion (which places additional brain at risk of infarction). 3. Extensive posterior circulation atherosclerotic disease with moderate basilar and high-grade bilateral proximal PCA stenoses. 4. Atrophy and chronic small vessel disease. 5. Ankylosing spondylitis with diffuse cervical ankylosis. 6. Remote right temporal contusion.   Electronically Signed   By: Monte Fantasia M.D.   On: 11/04/2014 07:55    Oren Binet, MD  [facility] Pager:[phone]  If 7PM-7AM, please contact night-coverage [email or web address] Password [email or web address] 11/06/2014, 11:13 AM   LOS: 2 days

## 2014-11-06 NOTE — Progress Notes (Signed)
STROKE TEAM PROGRESS NOTE   SUBJECTIVE (INTERVAL HISTORY) No family members present. She notes a small bit of oozing at the site of the loop recorder implant. Otherwise she has no complaints.   OBJECTIVE Temp:  [98.3 F (36.8 C)-99.1 F (37.3 C)] 98.4 F (36.9 C) (09/10 0550) Pulse Rate:  [68-119] 68 (09/10 0550) Cardiac Rhythm:  [-] Normal sinus rhythm (09/10 0700) Resp:  [14-25] 18 (09/10 0550) BP: (141-233)/(71-162) 163/95 mmHg (09/10 0550) SpO2:  [94 %-98 %] 94 % (09/10 0550)  No results for input(s): GLUCAP in the last 168 hours.  Recent Labs Lab 11/03/14 1633 11/03/14 1634 11/03/14 1953 11/05/14 0551  NA 141 142 142 138  K 4.0 3.9 4.3 3.7  CL 105 103 105 107  CO2 25  --  29 22  GLUCOSE 106* 105* 107* 113*  BUN 13 17 12 13   CREATININE 0.66 0.70 0.66 0.59  CALCIUM 9.2  --  9.2 8.4*    Recent Labs Lab 11/03/14 1633 11/03/14 1953  AST 39 34  ALT 55* 52  ALKPHOS 73 64  BILITOT 0.6 0.7  PROT 7.0 6.6  ALBUMIN 4.2 3.7    Recent Labs Lab 11/03/14 1633 11/03/14 1634 11/03/14 1953 11/05/14 0551  WBC 8.1  --  7.7 7.0  NEUTROABS 5.5  --   --   --   HGB 15.8* 17.0* 14.6 15.1*  HCT 47.0* 50.0* 43.9 45.2  MCV 87.0  --  86.8 87.3  PLT 252  --  252 239   No results for input(s): CKTOTAL, CKMB, CKMBINDEX, TROPONINI in the last 168 hours.  Recent Labs  11/03/14 1633  LABPROT 13.0  INR 0.96    Recent Labs  11/03/14 1638  COLORURINE YELLOW  LABSPEC 1.006  PHURINE 7.5  GLUCOSEU NEGATIVE  HGBUR NEGATIVE  BILIRUBINUR NEGATIVE  KETONESUR NEGATIVE  PROTEINUR NEGATIVE  UROBILINOGEN 0.2  NITRITE NEGATIVE  LEUKOCYTESUR NEGATIVE       Component Value Date/Time   CHOL 181 11/04/2014 0715   TRIG 153* 11/04/2014 0715   HDL 41 11/04/2014 0715   CHOLHDL 4.4 11/04/2014 0715   VLDL 31 11/04/2014 0715   LDLCALC 109* 11/04/2014 0715   Lab Results  Component Value Date   HGBA1C 6.4* 11/04/2014      Component Value Date/Time   LABOPIA NONE DETECTED  11/03/2014 1637   COCAINSCRNUR NONE DETECTED 11/03/2014 1637   LABBENZ NONE DETECTED 11/03/2014 1637   AMPHETMU NONE DETECTED 11/03/2014 1637   THCU NONE DETECTED 11/03/2014 1637   LABBARB NONE DETECTED 11/03/2014 1637     Recent Labs Lab 11/03/14 1953  ETH <5    I have personally reviewed the radiological images below and agree with the radiology interpretations.  Ct Head Wo Contrast 11/03/2014    No acute intracranial abnormality. There is no acute cortical infarction. Moderate cerebral atrophy. Moderate periventricular and patchy subcortical white matter decreased attenuation probable due to chronic small vessel ischemic changes. There is encephalomalacia in right temporal lobe anteriorly probable sequela from prior hemorrhage.    Mri and Mra Brain Wo Contrast  11/04/2014   1. Cluster of small acute infarcts in the central left MCA territory white matter and cortex. 2. High-grade bilateral distal M1 segment stenosis and left M2 branch occlusion (which places additional brain at risk of infarction). 3. Extensive posterior circulation atherosclerotic disease with moderate basilar and high-grade bilateral proximal PCA stenoses. 4. Atrophy and chronic small vessel disease. 5. Ankylosing spondylitis with diffuse cervical ankylosis. 6. Remote right temporal contusion.  Carotid Doppler  Bilateral: 1-39% ICA stenosis. Vertebral artery flow is antegrade.  2D Echocardiogram  - Left ventricle: The cavity size was normal. Wall thickness was increased in a pattern of moderate LVH. Systolic function was normal. The estimated ejection fraction was in the range of 55% to 60%. Wall motion was normal; there were no regional wall motion abnormalities. Doppler parameters are consistent with abnormal left ventricular relaxation (grade 1 diastolic dysfunction). - Mitral valve: Calcified annulus. There was mild regurgitation.  EKG  normal sinus rhythm. For complete results please see formal  report.  TEE 11/05/2014 Left Ventrical: Normal LV function, EF 60-65 %, mild - mod VLH Mitral Valve: mild MR  Aortic Valve: normal AV Tricuspid Valve: trivial TR Pulmonic Valve: normal  Left Atrium/ Left atrial appendage: no thrombi Atrial septum: no PFO or ASD by color flow or bubble contrast  Aorta: mild calcification  Complications: No apparent complications Patient did tolerate procedure well.  EEG pending  PHYSICAL EXAM  Temp:  [98.3 F (36.8 C)-99.1 F (37.3 C)] 98.4 F (36.9 C) (09/10 0550) Pulse Rate:  [68-119] 68 (09/10 0550) Resp:  [14-25] 18 (09/10 0550) BP: (141-233)/(71-162) 163/95 mmHg (09/10 0550) SpO2:  [94 %-98 %] 94 % (09/10 0550)  General - Well nourished, well developed, in no apparent distress.  Ophthalmologic - Sharp disc margins OU.  Cardiovascular - Regular rate and rhythm with no murmur.  Neck - supple, no carotid bruits  Mental Status -  Level of arousal and orientation to time, place, and person were intact. Language including expression, naming, repetition, comprehension was assessed and found intact. Fund of Knowledge was assessed and was intact.  Cranial Nerves II - XII - II - Visual field intact OU. III, IV, VI - Extraocular movements intact. V - Facial sensation intact bilaterally. VII - Facial movement intact bilaterally. VIII - Hearing & vestibular intact bilaterally. X - Palate elevates symmetrically. XI - Chin turning & shoulder shrug intact bilaterally. XII - Tongue protrusion intact.  Motor Strength - The patient's strength was normal in all extremities and pronator drift was absent.  Bulk was normal and fasciculations were absent.   Motor Tone - Muscle tone was assessed at the neck and appendages and was normal.  Reflexes - The patient's reflexes were symmetrical in all extremities and she had no pathological reflexes.  Sensory - Light touch, temperature/pinprick were assessed and were symmetrical.    Coordination -  The patient had normal movements in the hands and feet with no ataxia or dysmetria.  Tremor was absent.  Gait and Station - deferred due to safety concerns.   ASSESSMENT/PLAN [name] is a 81 y.o. female with history of anxiety and arthritis admitted for episodic aphasia. Symptoms resolved.    Stroke:  Dominant left patchy left MCA territory infarct embolic secondary to unclear source  MRI  left patchy MCA territory infarct  MRA  left M2 occlusion, left P2 stenosis, bilateral distal MCA branches stenosis  Carotid Doppler  unremarkable  2D Echo  Unremarkable  TEE - unremarkable, no PFO  LDL 109  EEG - Pending  HgbA1c 6.4  SCDs for VTE prophylaxis  Diet Heart Room service appropriate?: Yes; Fluid consistency:: Thin   no antithrombotic prior to admission, now on aspirin 325 mg orally every day and clopidogrel 75 mg orally every day. Due to intracranial stenosis, recommend dual antiplatelets for 3 months and then Plavix alone.  Recommend loop recorder placement to rule out afib  Patient counseled to be  compliant with her antithrombotic medications  Ongoing aggressive stroke risk factor management  Therapy recommendations:  No OT follow-up recommended. Intermittent supervision needed. PT eval pending.  Disposition:  Pending  Hypertension Home meds none Permissive hypertension (OK if <220/120) for 24-48 hours post stroke and then gradually normalized within 5-7 days.  Bp at high side   Avoid hypotension due to left M2 occlusion  Hyperlipidemia  Home meds:  Fish oil   Currently on none  LDL 109, goal < 70  Patient refused statin  Other Stroke Risk Factors  Advanced age  PLAN  Follow-up with Dr. Erlinda Hong in one month  Possible Respect Esus trial candidate.  Dual antiplatelets therapy 3 months and Plavix alone  Avoid hypotension  EEG - pending  Loop recorder implanted  Hospital day # 2  Mikey Bussing PA-C [facility] Pager  [phone] 11/06/2014, 9:50 AM   Loop recorder.  D/c planning Follow up as out pt. Leotis Pain  To contact Stroke Continuity provider, please refer to [email or web address] After hours, contact General Neurology
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
## 2014-11-06 NOTE — Progress Notes (Signed)
EEG completed, results pending. 

## 2014-11-06 NOTE — Progress Notes (Addendum)
DC instructions, prescriptions, handouts, and Stroke book provided to patient and her family.  All questions answered. Patient to be in [facility] escorted by staff to lobby. Family will transport home in private vehicle. Care plan resolved.

## 2014-11-08 ENCOUNTER — Encounter (HOSPITAL_COMMUNITY): Payer: Self-pay | Admitting: Cardiology

## 2014-11-09 ENCOUNTER — Encounter (HOSPITAL_COMMUNITY): Payer: Self-pay | Admitting: Cardiovascular Disease

## 2014-11-22 ENCOUNTER — Ambulatory Visit (INDEPENDENT_AMBULATORY_CARE_PROVIDER_SITE_OTHER): Payer: Medicare HMO | Admitting: *Deleted

## 2014-11-22 DIAGNOSIS — I639 Cerebral infarction, unspecified: Secondary | ICD-10-CM

## 2014-11-22 LAB — CUP PACEART INCLINIC DEVICE CHECK
Date Time Interrogation Session: 20160926162100
MDC IDC SET ZONE DETECTION INTERVAL: 3000 ms
MDC IDC SET ZONE DETECTION INTERVAL: 390 ms
Zone Setting Detection Interval: 2000 ms

## 2014-11-22 NOTE — Progress Notes (Signed)
Wound check appointment. Steri-strips removed by pt prior to arrival. Wound without redness or edema. Incision edges approximated, wound well healed. Loop check in clinic.  Pt with 0 tachy episodes; 0 brady episodes; 0 asystole. 1 symptom episode---post implant instructions. ROV w/ WC 02/22/15.

## 2014-12-02 ENCOUNTER — Ambulatory Visit: Payer: Self-pay | Admitting: Neurology

## 2014-12-06 ENCOUNTER — Ambulatory Visit (INDEPENDENT_AMBULATORY_CARE_PROVIDER_SITE_OTHER): Payer: Medicare HMO | Admitting: *Deleted

## 2014-12-06 DIAGNOSIS — I639 Cerebral infarction, unspecified: Secondary | ICD-10-CM | POA: Diagnosis not present

## 2014-12-07 NOTE — Progress Notes (Signed)
LOOP RECORDER  

## 2014-12-22 ENCOUNTER — Encounter: Payer: Self-pay | Admitting: Neurology

## 2014-12-22 ENCOUNTER — Ambulatory Visit (INDEPENDENT_AMBULATORY_CARE_PROVIDER_SITE_OTHER): Payer: Medicare HMO | Admitting: Neurology

## 2014-12-22 VITALS — BP 149/84 | HR 75 | Ht 59.0 in | Wt 128.0 lb

## 2014-12-22 DIAGNOSIS — Z8673 Personal history of transient ischemic attack (TIA), and cerebral infarction without residual deficits: Secondary | ICD-10-CM | POA: Insufficient documentation

## 2014-12-22 DIAGNOSIS — I63132 Cerebral infarction due to embolism of left carotid artery: Secondary | ICD-10-CM

## 2014-12-22 NOTE — Progress Notes (Signed)
[facility] [address] [place] [address] [phone]       OFFICE FOLLOW-UP NOTE  [name] Date of Birth:  [birth year] Medical Record Number:  [id number]   HPI: Ms Carrillo is a 81 year  Caucasian lady seen today for follow-up accompanied by her daughter after hospital admission for stroke in September 2016.DYLYNN KETNER is an 81 y.o. female who reports that on yesterday morning while talking to a friend on the phone she noted that she was unable to get her words out correctly for about 3 minutes. Her symptoms resolved spontaneously and she and her friend just laughed it off. She was able to talk normally last evening on a phone call before going to bed. This morning her first attempt at speech was with her prayer partner at about 10AM. Again she was unable to get the words out correctly. This episode lasted longer but again resolved on its own and by the time her partner left two hours later her speech was at baseline. Her children were called though and when her son arrived sometime after 1PM the patient was working in the yard. Again she had difficulty getting her words out. He noted that she was drooling and that she was moving slower than usual. EMS was called at that time. By the time EMS arrived (10 minutes) she was back to baseline and has had no recurrence of symptoms.  Date last known well: Date: 11/02/2014 Time last known well: Time: 22:00 tPA Given: No: Resolution of symptoms. CT scan of the head showed no acute abnormality and MRI scan showed a cluster of small acute infarcts in the central left MCA territory white matter as well as cortex with MRA showing high-grade distal M1 stenosis with left M2 branch occlusion on the left. There are also extensive posterior circulation atherosclerotic disease involving basilar and proximal PCA stenosis. Transthoracic echo showed ejection fraction of 55-60% without cardiac source of embolism.  Transesophageal echocardiogram was obtained which showed no evidence of PFO, intra-atrial clot or other cardiac source of embolism. EEG was obtained which was normal. Hemoglobin A1c was borderline at 6.4. LDL cholesterol was also borderline at 109 mg percent. Patient was recommended to be on dual antiplatelet therapy for intracranial stenosis and as well as had loop recorder placement for paroxysmal A. Fib. Patient states she's done well since discharge. She still has some intermittent word finding difficulties particularly when she is tired or gets excited and tries to walk talk fast. She has finished home speech therapy and is wondering if she needs outpatient. She's had no new stroke or TIA symptoms. She is tolerating aspirin and Plavix with only minor bruising and no bleeding. She wants to drive.  ROS:   14 system review of systems is positive for   Speech difficulties , writing difficulties and all other systems negative  PMH:  Past Medical History  Diagnosis Date  . Spondylitis, ankylosing ([place])   . Anxiety     h/o curious anxiety   . Arthritis     osteoporosis, ankylosing spondylosis   . Cancer ([place])     basal cell- facial   . History of blood transfusion     for back surgery  . Osteoporosis   . Stroke [facility])     Social History:  Social History   Social History  . Marital Status: Legally Separated    Spouse Name: N/A  . Number of Children: N/A  . Years of Education: N/A  Occupational History  . Not on file.   Social History Main Topics  . Smoking status: Never Smoker   . Smokeless tobacco: Not on file  . Alcohol Use: No  . Drug Use: No  . Sexual Activity: Not on file   Other Topics Concern  . Not on file   Social History Narrative    Medications:   Current Outpatient Prescriptions on File Prior to Visit  Medication Sig Dispense Refill  . Ascorbic Acid (VITAMIN C PO) Take 500 mg by mouth 3 (three) times daily.     Marland Kitchen aspirin 325 MG tablet Take 1 tablet (325 mg  total) by mouth daily. 90 tablet 0  . Cholecalciferol (VITAMIN D-3) 5000 UNITS TABS Take 2,000 Units by mouth daily.     . clopidogrel (PLAVIX) 75 MG tablet Take 1 tablet (75 mg total) by mouth daily. 90 tablet 0  . Digestive Enzymes CAPS Take 1 capsule by mouth 3 (three) times daily with meals.    . Folic Acid-Vit O3-JKK X38 (FOLBEE) 2.5-25-1 MG TABS tablet Take 1 tablet by mouth daily. 1 under tongue daily, dissolves    . Omega-3 Fatty Acids (OMEGA-3 FISH OIL PO) Take 1 capsule by mouth 2 (two) times daily.    Marland Kitchen OVER THE COUNTER MEDICATION Take 1 capsule by mouth 3 (three) times daily with meals. Amazing Grape    . OVER THE COUNTER MEDICATION Take 1 tablet by mouth 2 (two) times daily. Joint performance plus    . OVER THE COUNTER MEDICATION Take 1 tablet by mouth 3 (three) times daily. Thytrophine PMG    . OVER THE COUNTER MEDICATION Take 1 tablet by mouth 3 (three) times daily. Calcifood    . OVER THE COUNTER MEDICATION Take 1 tablet by mouth 3 (three) times daily. utrophin    . OVER THE COUNTER MEDICATION Take 1 tablet by mouth 3 (three) times daily. ovex    . OVER THE COUNTER MEDICATION Take 1 tablet by mouth daily. natto bp plus    . OVER THE COUNTER MEDICATION Take 1 capsule by mouth 3 (three) times daily with meals. Nitric oxide booster    . OVER THE COUNTER MEDICATION Take 3 mg by mouth 2 (two) times daily. Prolamine Iodine    . OVER THE COUNTER MEDICATION Take 3 tablets by mouth 3 (three) times daily. Drenamin    . OVER THE COUNTER MEDICATION Take 1 tablet by mouth 3 (three) times daily. Parotid PMG    . OVER THE COUNTER MEDICATION Take 1 capsule by mouth daily. Zinc Picolinate    . OVER THE COUNTER MEDICATION Take 1 capsule by mouth 2 (two) times daily. GSH-3 Cell Defense    . OVER THE COUNTER MEDICATION Take 2 capsules by mouth daily. Cardio Relax AO - Before Breakfast    . OVER THE COUNTER MEDICATION Take 2 capsules by mouth daily. Lunaflex    . OVER THE COUNTER MEDICATION Apply  1.82 application topically. 20mg . 1/4 Teaspoon. 5 days a week, skip all weekends    . OVER THE COUNTER MEDICATION 0.5 drops 3 (three) times daily. Allergy Drops in distilled water    . Probiotic Product (ADVANCED PROBIOTIC PO) Take 1 capsule by mouth daily.    Marland Kitchen Specialty Vitamins Products (BILBERRY PLUS) CAPS Take 1 capsule by mouth 2 (two) times daily.    Marland Kitchen Specialty Vitamins Products (LYMPH PO) Take 10 drops by mouth 3 (three) times daily.    . TURMERIC PO Take 1 capsule by mouth 2 (two) times  daily. With garlic and cayenne pepper     No current facility-administered medications on file prior to visit.    Allergies:   Allergies  Allergen Reactions  . Codeine Nausea Only    Physical Exam General: well developed, well nourished  Pleasant elderly Caucasian lady, seated, in no evident distress Head: head normocephalic and atraumatic.  Neck: supple with no carotid or supraclavicular bruits Cardiovascular: regular rate and rhythm, no murmurs Musculoskeletal: no deformity Skin:  no rash/petichiae Vascular:  Normal pulses all extremities Filed Vitals:   12/22/14 1037  BP: 149/84  Pulse: 75   Neurologic Exam Mental Status: Awake and fully alert. Oriented to place and time. Recent and remote memory intact. Attention span, concentration and fund of knowledge appropriate. Mood and affect appropriate.  Speech and language appear normal. No word finding difficulties, para physical errors or disfluency. Able to name and repeat quite well. Animal naming test 9 Cranial Nerves: Fundoscopic exam reveals sharp disc margins. Pupils equal, briskly reactive to light. Extraocular movements full without nystagmus. Visual fields full to confrontation. Hearing intact. Facial sensation intact. Face, tongue, palate moves normally and symmetrically.  Motor: Normal bulk and tone. Normal strength in all tested extremity muscles. Sensory.: intact to touch ,pinprick .position and vibratory sensation.    Coordination: Rapid alternating movements normal in all extremities. Finger-to-nose and heel-to-shin performed accurately bilaterally. Gait and Station: Arises from chair without difficulty. Stance is normal. Gait demonstrates normal stride length and balance . Able to heel, toe and tandem walk without difficulty.  Reflexes: 1+ and symmetric. Toes downgoing.   NIHSS  0 Modified Rankin  1   ASSESSMENT: 71 year Caucasian lady with left MCA branch infarct in September 3536 of embolic etiology without definite identified source. Vascular risk factors of hypertension  and intracranial atherosclerosis only    PLAN: I had a long d/w patient and daughter about her recent stroke, risk for recurrent stroke/TIAs, personally independently reviewed imaging studies and stroke evaluation results and answered questions.Continue Aspirin 81 mg and Plavix 75 mg  X 1 month then aspirin alone for secondary stroke prevention and maintain strict control of hypertension with blood pressure goal below 130/90, diabetes with hemoglobin A1c goal below 6.5% and lipids with LDL cholesterol goal below 100 mg/dL. I also advised the patient to eat a healthy diet with plenty of whole grains, cereals, fruits and vegetables, exercise regularly and maintain ideal body weight. She may possibly consider participation in the [place] stroke trial if interested and will be given written information to review at home.Greater than 50% of time during this 25 minute visit was spent on counseling,explanation of diagnosis, planning of further management, discussion with patient and family and coordination of care Followup in the future with me in  59months or call earlier if necessary.  Antony Contras, MD  Note: This document was prepared with digital dictation and possible smart phrase technology. Any transcriptional errors that result from this process are unintentional

## 2014-12-22 NOTE — Patient Instructions (Signed)
I had a long d/w patient and daughter about her recent stroke, risk for recurrent stroke/TIAs, personally independently reviewed imaging studies and stroke evaluation results and answered questions.Continue Aspirin 81 mg and Plavix 75 mg  X 1 month then aspirin alone for secondary stroke prevention and maintain strict control of hypertension with blood pressure goal below 130/90, diabetes with hemoglobin A1c goal below 6.5% and lipids with LDL cholesterol goal below 100 mg/dL. I also advised the patient to eat a healthy diet with plenty of whole grains, cereals, fruits and vegetables, exercise regularly and maintain ideal body weight. She may possibly consider participation in the [place] stroke trial if interested and will be given written information to review at home. Followup in the future with me in  14months or call earlier if necessary. Stroke Prevention Some medical conditions and behaviors are associated with an increased chance of having a stroke. You may prevent a stroke by making healthy choices and managing medical conditions. HOW CAN I REDUCE MY RISK OF HAVING A STROKE?   Stay physically active. Get at least 30 minutes of activity on most or all days.  Do not smoke. It may also be helpful to avoid exposure to secondhand smoke.  Limit alcohol use. Moderate alcohol use is considered to be:  No more than 2 drinks per day for men.  No more than 1 drink per day for nonpregnant women.  Eat healthy foods. This involves:  Eating 5 or more servings of fruits and vegetables a day.  Making dietary changes that address high blood pressure (hypertension), high cholesterol, diabetes, or obesity.  Manage your cholesterol levels.  Making food choices that are high in fiber and low in saturated fat, trans fat, and cholesterol may control cholesterol levels.  Take any prescribed medicines to control cholesterol as directed by your health care provider.  Manage your diabetes.  Controlling  your carbohydrate and sugar intake is recommended to manage diabetes.  Take any prescribed medicines to control diabetes as directed by your health care provider.  Control your hypertension.  Making food choices that are low in salt (sodium), saturated fat, trans fat, and cholesterol is recommended to manage hypertension.  Ask your health care provider if you need treatment to lower your blood pressure. Take any prescribed medicines to control hypertension as directed by your health care provider.  If you are 77-30 years of age, have your blood pressure checked every 3-5 years. If you are 16 years of age or older, have your blood pressure checked every year.  Maintain a healthy weight.  Reducing calorie intake and making food choices that are low in sodium, saturated fat, trans fat, and cholesterol are recommended to manage weight.  Stop drug abuse.  Avoid taking birth control pills.  Talk to your health care provider about the risks of taking birth control pills if you are over 41 years old, smoke, get migraines, or have ever had a blood clot.  Get evaluated for sleep disorders (sleep apnea).  Talk to your health care provider about getting a sleep evaluation if you snore a lot or have excessive sleepiness.  Take medicines only as directed by your health care provider.  For some people, aspirin or blood thinners (anticoagulants) are helpful in reducing the risk of forming abnormal blood clots that can lead to stroke. If you have the irregular heart rhythm of atrial fibrillation, you should be on a blood thinner unless there is a good reason you cannot take them.  Understand all  your medicine instructions.  Make sure that other conditions (such as anemia or atherosclerosis) are addressed. SEEK IMMEDIATE MEDICAL CARE IF:   You have sudden weakness or numbness of the face, arm, or leg, especially on one side of the body.  Your face or eyelid droops to one side.  You have sudden  confusion.  You have trouble speaking (aphasia) or understanding.  You have sudden trouble seeing in one or both eyes.  You have sudden trouble walking.  You have dizziness.  You have a loss of balance or coordination.  You have a sudden, severe headache with no known cause.  You have new chest pain or an irregular heartbeat. Any of these symptoms may represent a serious problem that is an emergency. Do not wait to see if the symptoms will go away. Get medical help at once. Call your local emergency services (911 in [place] Do not drive yourself to the hospital.   This information is not intended to replace advice given to you by your health care provider. Make sure you discuss any questions you have with your health care provider.   Document Released: 03/22/2004 Document Revised: 03/05/2014 Document Reviewed: 08/15/2012 Elsevier Interactive Patient Education [employer].

## 2014-12-23 ENCOUNTER — Ambulatory Visit: Payer: Self-pay | Admitting: Neurology

## 2014-12-27 LAB — CUP PACEART REMOTE DEVICE CHECK: MDC IDC SESS DTM: 20161009203736

## 2015-01-04 ENCOUNTER — Ambulatory Visit (INDEPENDENT_AMBULATORY_CARE_PROVIDER_SITE_OTHER): Payer: Medicare HMO | Admitting: *Deleted

## 2015-01-04 DIAGNOSIS — I639 Cerebral infarction, unspecified: Secondary | ICD-10-CM

## 2015-01-04 LAB — CUP PACEART REMOTE DEVICE CHECK: Date Time Interrogation Session: 20161108203747

## 2015-01-05 NOTE — Progress Notes (Signed)
Carelink Summary Report / Loop recorder 

## 2015-01-27 ENCOUNTER — Encounter: Payer: Self-pay | Admitting: Cardiology

## 2015-02-02 ENCOUNTER — Encounter: Payer: Self-pay | Admitting: Cardiology

## 2015-02-03 ENCOUNTER — Telehealth: Payer: Self-pay | Admitting: Neurology

## 2015-02-03 ENCOUNTER — Ambulatory Visit (INDEPENDENT_AMBULATORY_CARE_PROVIDER_SITE_OTHER): Payer: Medicare HMO | Admitting: *Deleted

## 2015-02-03 DIAGNOSIS — I639 Cerebral infarction, unspecified: Secondary | ICD-10-CM | POA: Diagnosis not present

## 2015-02-03 NOTE — Telephone Encounter (Signed)
Message was given to Dr.Sethi to call patients son back.

## 2015-02-03 NOTE — Telephone Encounter (Signed)
Pt's son called and states that pt ( mother) has been dizzy for the past week off and on. She is getting over a cold; the cold is better but the dizziness is getting worse. She is not able to walk with out a walker now, week long. She is also dragging her right leg when walking. No numbness, a little confusion but not more than normal since first stroke. Not taking any cold medication right now. She is eating and drinking well. They want to know if she needs to come in today or go to the hospital. Pt states that her left leg feels weaker. Please call and advise [phone] Cell, [name], son.

## 2015-02-03 NOTE — Telephone Encounter (Signed)
[name], patient's son is returning a call. Please call.

## 2015-02-03 NOTE — Telephone Encounter (Signed)
I spoke to the patient's son Erica [name] informed me that the patient was recovering from a cough and cold illness but has been quite dizzy for the last 1 week and requires help to get up and walk. He also felt she was dragging her right leg and is concerned about a new stroke. I advised him to take her to the urgent care to be evaluated and get a brain scan. He voiced understanding.

## 2015-02-03 NOTE — Telephone Encounter (Signed)
Rn talk to patients son [name] about his mom having dizzy spells, right leg weakness. Pt has had dizzy spells for 4 days now. Pt is currently using a walker because of the right leg being weak.Pt id not have the weakness before per son. Pts son stated his mom is seeing the PCP for the cold now. PTs mom was at the office today. PT son she is not having slurred speech, drooping, numbness. Rn stated Dr.Sethi will be sent a message. Rn stated if his mom starts having stroke sympts , she should be seen at [facility] immediately. Pt also had any falls. PT son he understand and will like a call from the MD.

## 2015-02-04 ENCOUNTER — Emergency Department (HOSPITAL_COMMUNITY): Payer: Medicare HMO

## 2015-02-04 ENCOUNTER — Encounter (HOSPITAL_COMMUNITY): Payer: Self-pay | Admitting: *Deleted

## 2015-02-04 ENCOUNTER — Inpatient Hospital Stay (HOSPITAL_COMMUNITY)
Admission: EM | Admit: 2015-02-04 | Discharge: 2015-02-07 | DRG: 065 | Disposition: A | Discharge status: Home Health | Payer: Medicare HMO | Attending: Internal Medicine | Admitting: Internal Medicine

## 2015-02-04 ENCOUNTER — Inpatient Hospital Stay (HOSPITAL_COMMUNITY): Payer: Medicare HMO

## 2015-02-04 DIAGNOSIS — R269 Unspecified abnormalities of gait and mobility: Secondary | ICD-10-CM

## 2015-02-04 DIAGNOSIS — I639 Cerebral infarction, unspecified: Secondary | ICD-10-CM | POA: Diagnosis present

## 2015-02-04 DIAGNOSIS — F418 Other specified anxiety disorders: Secondary | ICD-10-CM | POA: Diagnosis present

## 2015-02-04 DIAGNOSIS — Z823 Family history of stroke: Secondary | ICD-10-CM | POA: Diagnosis not present

## 2015-02-04 DIAGNOSIS — E785 Hyperlipidemia, unspecified: Secondary | ICD-10-CM | POA: Diagnosis present

## 2015-02-04 DIAGNOSIS — Z87820 Personal history of traumatic brain injury: Secondary | ICD-10-CM | POA: Diagnosis not present

## 2015-02-04 DIAGNOSIS — Z7902 Long term (current) use of antithrombotics/antiplatelets: Secondary | ICD-10-CM

## 2015-02-04 DIAGNOSIS — F419 Anxiety disorder, unspecified: Secondary | ICD-10-CM | POA: Diagnosis present

## 2015-02-04 DIAGNOSIS — G8194 Hemiplegia, unspecified affecting left nondominant side: Secondary | ICD-10-CM | POA: Diagnosis present

## 2015-02-04 DIAGNOSIS — I63449 Cerebral infarction due to embolism of unspecified cerebellar artery: Principal | ICD-10-CM | POA: Diagnosis present

## 2015-02-04 DIAGNOSIS — Z8673 Personal history of transient ischemic attack (TIA), and cerebral infarction without residual deficits: Secondary | ICD-10-CM

## 2015-02-04 DIAGNOSIS — R262 Difficulty in walking, not elsewhere classified: Secondary | ICD-10-CM | POA: Diagnosis present

## 2015-02-04 DIAGNOSIS — R531 Weakness: Secondary | ICD-10-CM | POA: Diagnosis present

## 2015-02-04 DIAGNOSIS — G8929 Other chronic pain: Secondary | ICD-10-CM | POA: Diagnosis present

## 2015-02-04 DIAGNOSIS — M810 Age-related osteoporosis without current pathological fracture: Secondary | ICD-10-CM | POA: Diagnosis present

## 2015-02-04 DIAGNOSIS — I10 Essential (primary) hypertension: Secondary | ICD-10-CM | POA: Diagnosis present

## 2015-02-04 DIAGNOSIS — I6340 Cerebral infarction due to embolism of unspecified cerebral artery: Secondary | ICD-10-CM | POA: Diagnosis not present

## 2015-02-04 DIAGNOSIS — Z8669 Personal history of other diseases of the nervous system and sense organs: Secondary | ICD-10-CM | POA: Diagnosis not present

## 2015-02-04 DIAGNOSIS — Z7982 Long term (current) use of aspirin: Secondary | ICD-10-CM | POA: Diagnosis not present

## 2015-02-04 DIAGNOSIS — M549 Dorsalgia, unspecified: Secondary | ICD-10-CM | POA: Diagnosis present

## 2015-02-04 DIAGNOSIS — M1990 Unspecified osteoarthritis, unspecified site: Secondary | ICD-10-CM | POA: Diagnosis present

## 2015-02-04 DIAGNOSIS — I6789 Other cerebrovascular disease: Secondary | ICD-10-CM | POA: Diagnosis not present

## 2015-02-04 LAB — COMPREHENSIVE METABOLIC PANEL
ALBUMIN: 3.9 g/dL (ref 3.5–5.0)
ALT: 31 U/L (ref 14–54)
AST: 28 U/L (ref 15–41)
Alkaline Phosphatase: 62 U/L (ref 38–126)
Anion gap: 9 (ref 5–15)
BILIRUBIN TOTAL: 0.6 mg/dL (ref 0.3–1.2)
BUN: 15 mg/dL (ref 6–20)
CHLORIDE: 106 mmol/L (ref 101–111)
CO2: 27 mmol/L (ref 22–32)
Calcium: 9.6 mg/dL (ref 8.9–10.3)
Creatinine, Ser: 0.68 mg/dL (ref 0.44–1.00)
GFR calc Af Amer: >60 mL/min (ref >60)
GFR calc non Af Amer: >60 mL/min (ref >60)
GLUCOSE: 125 mg/dL — AB (ref 65–99)
POTASSIUM: 4.2 mmol/L (ref 3.5–5.1)
SODIUM: 142 mmol/L (ref 135–145)
TOTAL PROTEIN: 7.1 g/dL (ref 6.5–8.1)

## 2015-02-04 LAB — CBC WITH DIFFERENTIAL/PLATELET
Basophils Absolute: 0.1 10*3/uL (ref 0.0–0.1)
Basophils Relative: 1 %
EOS PCT: 0 %
Eosinophils Absolute: 0 10*3/uL (ref 0.0–0.7)
HEMATOCRIT: 44.6 % (ref 36.0–46.0)
Hemoglobin: 14.9 g/dL (ref 12.0–15.0)
LYMPHS ABS: 1.5 10*3/uL (ref 0.7–4.0)
LYMPHS PCT: 18 %
MCH: 29.4 pg (ref 26.0–34.0)
MCHC: 33.4 g/dL (ref 30.0–36.0)
MCV: 88.1 fL (ref 78.0–100.0)
MONO ABS: 0.7 10*3/uL (ref 0.1–1.0)
MONOS PCT: 9 %
Neutro Abs: 6.2 10*3/uL (ref 1.7–7.7)
Neutrophils Relative %: 72 %
PLATELETS: 305 10*3/uL (ref 150–400)
RBC: 5.06 MIL/uL (ref 3.87–5.11)
RDW: 14.6 % (ref 11.5–15.5)
WBC: 8.6 10*3/uL (ref 4.0–10.5)

## 2015-02-04 LAB — TROPONIN I: Troponin I: <0.03 ng/mL (ref <0.031)

## 2015-02-04 MED ORDER — SODIUM CHLORIDE 0.9 % IV SOLN
INTRAVENOUS | Status: DC
  Administered 2015-02-04: 20:00:00 via INTRAVENOUS

## 2015-02-04 MED ORDER — CLOPIDOGREL BISULFATE 75 MG PO TABS
75.0000 mg | ORAL_TABLET | Freq: Every day | ORAL | Status: DC
  Administered 2015-02-04 – 2015-02-07 (×4): 75 mg via ORAL
  Filled 2015-02-04 (×4): qty 1

## 2015-02-04 MED ORDER — OMEGA-3-ACID ETHYL ESTERS 1 G PO CAPS
1.0000 | ORAL_CAPSULE | Freq: Two times a day (BID) | ORAL | Status: DC
  Administered 2015-02-05 – 2015-02-07 (×5): 1 g via ORAL
  Filled 2015-02-04 (×5): qty 1

## 2015-02-04 MED ORDER — STROKE: EARLY STAGES OF RECOVERY BOOK
Freq: Once | Status: AC
  Administered 2015-02-04: 20:00:00
  Filled 2015-02-04: qty 1

## 2015-02-04 MED ORDER — ASPIRIN 325 MG PO TABS
325.0000 mg | ORAL_TABLET | Freq: Every day | ORAL | Status: DC
  Administered 2015-02-04 – 2015-02-07 (×4): 325 mg via ORAL
  Filled 2015-02-04 (×4): qty 1

## 2015-02-04 MED ORDER — ENOXAPARIN SODIUM 40 MG/0.4ML ~~LOC~~ SOLN
40.0000 mg | SUBCUTANEOUS | Status: DC
  Administered 2015-02-04 – 2015-02-06 (×3): 40 mg via SUBCUTANEOUS
  Filled 2015-02-04 (×3): qty 0.4

## 2015-02-04 MED ORDER — IOHEXOL 350 MG/ML SOLN
50.0000 mL | Freq: Once | INTRAVENOUS | Status: AC | PRN
  Administered 2015-02-04: 50 mL via INTRAVENOUS

## 2015-02-04 NOTE — Progress Notes (Signed)
Patient arrived to 5C13 AAOx4 and pleasant. Family at the bedside. Vitals taken and tele placed. Call bell at her side. Will continue to monitor. Matraca Hunkins, Rande Brunt, RN

## 2015-02-04 NOTE — ED Notes (Signed)
Patient transported to MRI 

## 2015-02-04 NOTE — ED Provider Notes (Signed)
CSN: [id number]     Arrival date & time 02/04/15  1056 History   First MD Initiated Contact with Patient 02/04/15 1108     Chief Complaint  Patient presents with  . Dizziness     (Consider location/radiation/quality/duration/timing/severity/associated sxs/prior Treatment) HPI Patient has a history of CVA in September of this year. That was embolic to the left MCA. The patient had good recovery. She walked independently. She reports predominantly her speech was impacted. She has now had onset of gait instability over several days duration. Patient has not been able to identify a specific time of change. She notes that she got a cold around Thanksgiving and some time after those symptoms had started, approximately in the last 4-5 days her gait became unsteady and she has been unable to walk without a walker. She reports taking some herbal supplements for her cold and reports that although symptoms have resolved. She no longer is having problems with significant nasal discharge or cough. The significant change occurred approximately 2 days ago. She reports she will list to the side or fall if she does not have support. At one point approximately 1-2 days ago, her son reports that her left leg was weak and dragging. That seems to improved at this point however her gait remains too unsteady for independent walking. He denies any headache in association with this. She does feel some degree of dizziness. No fever. No visual symptoms. She does not endorse specific upper extremity focal weakness or numbness. Past Medical History  Diagnosis Date  . Spondylitis, ankylosing ([place])   . Anxiety     h/o curious anxiety   . Arthritis     osteoporosis, ankylosing spondylosis   . Cancer ([place])     basal cell- facial   . History of blood transfusion     for back surgery  . Osteoporosis   . Stroke [facility])    Past Surgical History  Procedure Laterality Date  . Abdominal hysterectomy    . Hardware removal N/A  10/23/2013    Procedure: HARDWARE REMOVAL;  Surgeon: Kristeen Miss, MD;  Location: [place] NEURO ORS;  Service: Neurosurgery;  Laterality: N/A;  HARDWARE REMOVAL  . Craniotomy Right 10/06/2013    Procedure: CRANIECTOMY HEMATOMA EVACUATION SUBDURAL, PLACEMENT OF SKULL FLAP IN ABDOMEN;  Surgeon: Kristeen Miss, MD;  Location: [place] NEURO ORS;  Service: Neurosurgery;  Laterality: Right;  . Posterior lumbar fusion N/A 10/06/2013    Procedure: POSTERIOR LUMBAR FUSION  lumbar one/two;  Surgeon: Kristeen Miss, MD;  Location: [place] NEURO ORS;  Service: Neurosurgery;  Laterality: N/A;  . Cranioplasty N/A 01/11/2014    Procedure: CRANIOPLASTY FROM FLAP IN ABDOMEN;  Surgeon: Kristeen Miss, MD;  Location: [place] NEURO ORS;  Service: Neurosurgery;  Laterality: N/A;  . Ep implantable device N/A 11/05/2014    Procedure: Loop Recorder Insertion;  Surgeon: Will Meredith Leeds, MD;  Location: [place] CV LAB;  Service: Cardiovascular;  Laterality: N/A;  . Tee without cardioversion N/A 11/05/2014    Procedure: TRANSESOPHAGEAL ECHOCARDIOGRAM (TEE);  Surgeon: Thayer Headings, MD;  Location: [facility] ENDOSCOPY;  Service: Cardiovascular;  Laterality: N/A;   Family History  Problem Relation Age of Onset  . Stroke Mother   . Stroke Father    Social History  Substance Use Topics  . Smoking status: Never Smoker   . Smokeless tobacco: None  . Alcohol Use: No   OB History    No data available     Review of Systems 10 Systems reviewed and are negative  for acute change except as noted in the HPI.    Allergies  Codeine  Home Medications   Prior to Admission medications   Medication Sig Start Date End Date Taking? Authorizing Provider  Ascorbic Acid (VITAMIN C PO) Take 500 mg by mouth 3 (three) times daily.     Historical Provider, MD  aspirin 325 MG tablet Take 1 tablet (325 mg total) by mouth daily. 11/06/14   Shanker Kristeen Mans, MD  Cholecalciferol (VITAMIN D-3) 5000 UNITS TABS Take 2,000 Units by mouth daily.     Historical Provider,  MD  clopidogrel (PLAVIX) 75 MG tablet Take 1 tablet (75 mg total) by mouth daily. 11/06/14   Shanker Kristeen Mans, MD  Digestive Enzymes CAPS Take 1 capsule by mouth 3 (three) times daily with meals.    Historical Provider, MD  Folic Acid-Vit Q000111Q [id number] (FOLBEE) 2.5-25-1 MG TABS tablet Take 1 tablet by mouth daily. 1 under tongue daily, dissolves    Historical Provider, MD  MAGNESIUM CITRATE PO Take 160 capsules by mouth.    Historical Provider, MD  Omega-3 Fatty Acids (OMEGA-3 FISH OIL PO) Take 1 capsule by mouth 2 (two) times daily.    Historical Provider, MD  OVER THE COUNTER MEDICATION Take 1 capsule by mouth 3 (three) times daily with meals. Amazing Grape    Historical Provider, MD  OVER THE COUNTER MEDICATION Take 1 tablet by mouth 2 (two) times daily. Joint performance plus    Historical Provider, MD  OVER THE COUNTER MEDICATION Take 1 tablet by mouth 3 (three) times daily. Thytrophine PMG    Historical Provider, MD  OVER THE COUNTER MEDICATION Take 1 tablet by mouth 3 (three) times daily. Calcifood    Historical Provider, MD  OVER THE COUNTER MEDICATION Take 1 tablet by mouth 3 (three) times daily. utrophin    Historical Provider, MD  OVER THE COUNTER MEDICATION Take 1 tablet by mouth 3 (three) times daily. ovex    Historical Provider, MD  OVER THE COUNTER MEDICATION Take 1 tablet by mouth daily. natto bp plus    Historical Provider, MD  OVER THE COUNTER MEDICATION Take 1 capsule by mouth 3 (three) times daily with meals. Nitric oxide booster    Historical Provider, MD  OVER THE COUNTER MEDICATION Take 3 mg by mouth 2 (two) times daily. Prolamine Iodine    Historical Provider, MD  OVER THE COUNTER MEDICATION Take 3 tablets by mouth 3 (three) times daily. Drenamin    Historical Provider, MD  OVER THE COUNTER MEDICATION Take 1 tablet by mouth 3 (three) times daily. Parotid PMG    Historical Provider, MD  OVER THE COUNTER MEDICATION Take 1 capsule by mouth daily. Zinc Picolinate    Historical  Provider, MD  OVER THE COUNTER MEDICATION Take 1 capsule by mouth 2 (two) times daily. GSH-3 Cell Defense    Historical Provider, MD  OVER THE COUNTER MEDICATION Take 2 capsules by mouth daily. Cardio Relax AO - Before Breakfast    Historical Provider, MD  OVER THE COUNTER MEDICATION Take 2 capsules by mouth daily. Lunaflex    Historical Provider, MD  OVER THE COUNTER MEDICATION Apply [id number] application topically. 20mg . 1/4 Teaspoon. 5 days a week, skip all weekends    Historical Provider, MD  OVER THE COUNTER MEDICATION 0.5 drops 3 (three) times daily. Allergy Drops in distilled water    Historical Provider, MD  OVER THE COUNTER MEDICATION 1 tbsp 3 times a day for 9 days a month    Historical  Provider, MD  OVER THE COUNTER MEDICATION 1 capsule 3 (three) times daily after meals.    Historical Provider, MD  OVER THE COUNTER MEDICATION Place under the tongue. 1/2 drop 3 times a day as needed    Historical Provider, MD  [place] by Other route 3 (three) times daily. 1/2 dropper three times a day    Historical Provider, MD  [place] 3 (three) times daily. 2 sprays as needed    Historical Provider, MD  Probiotic Product (ADVANCED PROBIOTIC PO) Take 1 capsule by mouth daily.    Historical Provider, MD  Specialty Vitamins Products (BILBERRY PLUS) CAPS Take 1 capsule by mouth 2 (two) times daily.    Historical Provider, MD  Specialty Vitamins Products (LYMPH PO) Take 10 drops by mouth 3 (three) times daily.    Historical Provider, MD  TURMERIC PO Take 1 capsule by mouth 2 (two) times daily. With garlic and cayenne pepper    Historical Provider, MD   BP 150/94 mmHg  Pulse 76  Temp(Src) 98.7 F (37.1 C) (Oral)  Resp 15  SpO2 98% Physical Exam  Constitutional: She is oriented to person, place, and time. She appears well-developed and well-nourished.  HENT:  Head: Normocephalic and atraumatic.  Right Ear: External ear normal.  Left Ear: External ear normal.  Nose:  Nose normal.  Mouth/Throat: Oropharynx is clear and moist.  Eyes: EOM are normal. Pupils are equal, round, and reactive to light.  Neck: Neck supple.  Cardiovascular: Normal rate, regular rhythm, normal heart sounds and intact distal pulses.   Pulmonary/Chest: Effort normal and breath sounds normal.  Abdominal: Soft. Bowel sounds are normal. She exhibits no distension. There is no tenderness.  Musculoskeletal: Normal range of motion. She exhibits no edema or tenderness.  Neurological: She is alert and oriented to person, place, and time. She has normal strength. No cranial nerve deficit. She exhibits normal muscle tone. Coordination normal. GCS eye subscore is 4. GCS verbal subscore is 5. GCS motor subscore is 6.  Skin: Skin is warm, dry and intact.  Psychiatric: She has a normal mood and affect.    ED Course  Procedures (including critical care time) Labs Review Labs Reviewed  COMPREHENSIVE METABOLIC PANEL - Abnormal; Notable for the following:    Glucose, Bld 125 (*)    All other components within normal limits  CBC WITH DIFFERENTIAL/PLATELET  TROPONIN I    Imaging Review Mr Brain Wo Contrast (neuro Protocol)  02/04/2015  CLINICAL DATA:  Left-sided weakness following recent viral illness. Gait instability. Difficulty ambulating. Personal history of CVA 3 months ago. EXAM: MRI HEAD WITHOUT CONTRAST TECHNIQUE: Multiplanar, multiecho pulse sequences of the brain and surrounding structures were obtained without intravenous contrast. COMPARISON:  MRI the brain 11/03/2014 FINDINGS: Acute nonhemorrhagic infarct is present within the posterior limb of the right internal capsule and extending into the brainstem. A diffuse left frontal and parietal white matter acute nonhemorrhagic infarct represents progression of the previous left-sided infarct. There is slight progression of ventricular enlargement. The lateral ventricles, third ventricle, and fourth ventricle are all enlarged. Periventricular  T2 changes are again seen. There is increased T2 change over the left centrum semiovale, corresponding to the area of acute infarct. New matter changes are also present in the posterior limb of the right internal capsule. Remote lacunar infarcts of the basal ganglia bilaterally are otherwise stable. A right craniotomy is again noted. The internal auditory canals are within normal limits bilaterally. Flow is present in the  major intracranial arteries. The globes orbits are intact. Encephalomalacia of the anterior right temporal tip and anterior inferior right frontal lobe are stable. A polyp or mucous retention cyst is again noted in the right maxillary sinus. An anterior right ethmoid air cell is now opacified. IMPRESSION: 1. Acute nonhemorrhagic infarct involving the posterior limb right internal capsule with extension into the brainstem. 2. Acute nonhemorrhagic infarcts in the left frontal and parietal white matter consistent with expansion of the previously-seen infarct. 3. Otherwise stable atrophy and diffuse white matter disease. 4. Remote encephalomalacia of the right temporal lobe. 5. Interval increase in ventriculomegaly. 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 These results were called by telephone at the time of interpretation on 02/04/2015 at 3:54 pm to Dr. Charlesetta Shanks , who verbally acknowledged these results. Electronically Signed   By: San Morelle M.D.   On: 02/04/2015 15:54   I have personally reviewed and evaluated these images and lab results as part of my medical decision-making.   EKG Interpretation   Date/Time:  Friday February 04 2015 11:45:29 EST Ventricular Rate:  83 PR Interval:  164 QRS Duration: 95 QT Interval:  389 QTC Calculation: 457 R Axis:   48 Text Interpretation:  Sinus rhythm Prominent P waves, nondiagnostic  Probable anteroseptal infarct, recent Lateral leads are also involved  agree. no siig change Confirmed by Johnney Killian, MD, Jeannie Done [phone]) on   02/04/2015 1:34:50 PM     Consult neurology Dr. Leonel Ramsay has evaluated the patient and will be admitted to hospital service. Consult the hospitalist service. MDM   Final diagnoses:  Cerebral infarction due to embolism of cerebral artery [facility])  Neurologic gait dysfunction   Patient presents with several days of neurologic symptoms. There is no specific time of onset. MRI confirms bilateral embolic infarcts. His mental status is clear. She is not having any respiratory distress or difficulty handing secretions. In supine examination she has intact motor use of her extremities. She reports significant gait dysfunction. Patient will be admitted for ongoing management and treatment.   Charlesetta Shanks, MD 02/04/15 [phone]

## 2015-02-04 NOTE — Progress Notes (Signed)
[facility] Summary Report / Loop Recorder 
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
## 2015-02-04 NOTE — ED Notes (Signed)
Report attempted 

## 2015-02-04 NOTE — ED Notes (Signed)
Pt reports dizziness and unsteady gait for several days.

## 2015-02-04 NOTE — ED Notes (Signed)
Report attempted, was told charge nurse has not assigned
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
## 2015-02-04 NOTE — ED Notes (Signed)
Attempted report 

## 2015-02-04 NOTE — Telephone Encounter (Signed)
I spoke to the son yesterday and advised him to take her to the urgent care.

## 2015-02-04 NOTE — Telephone Encounter (Signed)
[name] called to advise the Urgent Care Dr. Leonie Man recommended is closed, feels like Mom may be having another stroke, wonders if he should just take her to the [facility] ED. [name] advised to proceed to [facility] ED. [name] would like for Dr. Leonie Man to expedite things at [facility] ED.

## 2015-02-05 ENCOUNTER — Other Ambulatory Visit (HOSPITAL_COMMUNITY): Payer: Medicare HMO

## 2015-02-05 DIAGNOSIS — Z87820 Personal history of traumatic brain injury: Secondary | ICD-10-CM

## 2015-02-05 DIAGNOSIS — E785 Hyperlipidemia, unspecified: Secondary | ICD-10-CM

## 2015-02-05 DIAGNOSIS — Z8669 Personal history of other diseases of the nervous system and sense organs: Secondary | ICD-10-CM

## 2015-02-05 DIAGNOSIS — I639 Cerebral infarction, unspecified: Secondary | ICD-10-CM

## 2015-02-05 LAB — LIPID PANEL
CHOLESTEROL: 171 mg/dL (ref 0–200)
HDL: 33 mg/dL — ABNORMAL LOW (ref >40)
LDL Cholesterol: 104 mg/dL — ABNORMAL HIGH (ref 0–99)
Total CHOL/HDL Ratio: 5.2 RATIO
Triglycerides: 170 mg/dL — ABNORMAL HIGH (ref <150)
VLDL: 34 mg/dL (ref 0–40)

## 2015-02-05 MED ORDER — ATORVASTATIN CALCIUM 10 MG PO TABS
20.0000 mg | ORAL_TABLET | Freq: Every day | ORAL | Status: DC
  Administered 2015-02-05 – 2015-02-06 (×2): 20 mg via ORAL
  Filled 2015-02-05 (×2): qty 2

## 2015-02-05 NOTE — Progress Notes (Signed)
Loop interrogated and no arrhthymias noted.
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
## 2015-02-05 NOTE — Progress Notes (Signed)
Have asked for loop to be interrogated.
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
## 2015-02-06 ENCOUNTER — Inpatient Hospital Stay (HOSPITAL_COMMUNITY): Payer: Medicare HMO

## 2015-02-06 DIAGNOSIS — I6789 Other cerebrovascular disease: Secondary | ICD-10-CM
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
## 2015-02-06 NOTE — Progress Notes (Signed)
  Echocardiogram 2D Echocardiogram has been performed.  [name] 02/06/2015, 11:24 AM

## 2015-02-06 NOTE — Progress Notes (Signed)
Patient  States she thinks she is having a bad effect from the Lipitor she took yesterday at 16:30. Denies any pain but just not feeling well. Vital signs Tem 97.7 P74  R16  B/P 172/90 No acute resp distress satis on room air 96% Provider made aware.

## 2015-02-07 DIAGNOSIS — I6340 Cerebral infarction due to embolism of unspecified cerebral artery: Secondary | ICD-10-CM | POA: Insufficient documentation

## 2015-02-07 LAB — HEMOGLOBIN A1C
Hgb A1c MFr Bld: 6.1 % — ABNORMAL HIGH (ref 4.8–5.6)
MEAN PLASMA GLUCOSE: 128 mg/dL

## 2015-02-07 MED ORDER — NONFORMULARY OR COMPOUNDED ITEM: Status: AC

## 2015-02-07 MED ORDER — ATORVASTATIN CALCIUM 20 MG PO TABS: 20.0000 mg | ORAL_TABLET | Freq: Every day | ORAL | Status: DC
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
## 2015-02-07 NOTE — Care Management Important Message (Signed)
Important Message  Patient Details  Name: Erica [name] MRN: [id number] Date of Birth: 10/17/1933   Medicare Important Message Given:  Yes    Jerron Niblack P Jermal Dismuke 02/07/2015, 2:12 PM
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
## 2015-02-07 NOTE — Discharge Summary (Signed)
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 Posterior circulation: Distal vertebral arteries are stable since September and without stenosis. Left PICA and dominant appearing AICA origins again noted. There is stable mild to moderate stenosis of the midbasilar artery. SCA and PCA origins are within normal limits. Normal left posterior communicating artery, the  right is diminutive. Moderate to severe stenosis of the left PCA P2 segment is stable. There is preserved distal left PCA flow. Right PCA branches within normal limits. Anterior circulation: Calcified plaque throughout both ICA siphons. Ophthalmic and posterior communicating artery origins are normal. No siphon stenosis identified. Stable and normal carotid termini. Stable and normal MCA and ACA origins. Anterior communicating artery and bilateral ACA branches are within normal limits. Left MCA origin is normal. Mild to moderate M1 segment irregularity is stable without  significant stenosis. Left MCA bifurcation is patent. There is moderate to severe stenosis in the proximal aspect of the dominant posterior left MCA division best seen on series 505, image 19. This is stable since September. Left MCA branches are stable, no other stenosis and no left MCA branch occlusion identified. Right MCA origin, M1 segment within normal limits. Right MCA bifurcation is patent, however, there is moderate to severe stenosis of the proximal M2 branches, best seen on series 502, image 195. This appears stable since September. No right MCA branch occlusion is identified. Venous sinuses: Patent. Anatomic variants: None. Delayed phase: No abnormal enhancement identified. IMPRESSION: 1. Normal extracranial carotid and vertebral arteries aside from tortuosity. Mild to moderate plaque in the distal arch and proximal left subclavian artery without stenosis. 2. 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 3. Stable appearance of the brain from MRI earlier today. 4. Ankylosing spondylitis, with remote T2 fracture. Electronically Signed   By: Genevie Ann M.D.   On: 02/04/2015 18:43   Mr Brain Wo Contrast (neuro Protocol)  02/04/2015  CLINICAL DATA:  Left-sided weakness following recent viral illness. Gait instability. Difficulty ambulating. Personal history of CVA 3 months ago. EXAM: MRI HEAD WITHOUT CONTRAST TECHNIQUE: Multiplanar, multiecho pulse sequences of the brain and surrounding structures were obtained without intravenous contrast. COMPARISON:  MRI the brain 11/03/2014 FINDINGS: Acute nonhemorrhagic infarct is  present within the posterior limb of the right internal capsule and extending into the brainstem. A diffuse left frontal and parietal white matter acute nonhemorrhagic infarct represents progression of the previous left-sided infarct. There is slight progression of ventricular enlargement. The lateral ventricles, third ventricle, and fourth ventricle are all enlarged. Periventricular T2 changes are again seen. There is increased T2 change over the left centrum semiovale, corresponding to the area of acute infarct. New matter changes are also present in the posterior limb of the right internal capsule. Remote lacunar infarcts of the basal ganglia bilaterally are otherwise stable. A right craniotomy is again noted. The internal auditory canals are within normal limits bilaterally. Flow is present in the major intracranial arteries. The globes orbits are intact. Encephalomalacia of the anterior right temporal tip and anterior inferior right frontal lobe are stable. A polyp or mucous retention cyst is again noted in the right maxillary sinus. An anterior right ethmoid air cell is now opacified. IMPRESSION: 1. Acute nonhemorrhagic infarct involving the posterior limb right internal capsule with extension into the brainstem. 2. Acute nonhemorrhagic infarcts in the left frontal and parietal white matter consistent with expansion of the previously-seen infarct. 3. Otherwise stable atrophy and diffuse white matter disease. 4. Remote encephalomalacia of the  right temporal lobe. 5. Interval increase in ventriculomegaly. 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 These results were called by telephone at the time of interpretation on 02/04/2015 at 3:54 pm to Dr. Charlesetta Shanks , who verbally acknowledged these results. Electronically Signed   By: San Morelle M.D.   On: 02/04/2015 15:54    Micro Results   No results found for this or any previous visit (from the past 240 hour(s)).  Today   Subjective     Erica [name] today has no headache,no chest abdominal pain,no new weakness tingling or numbness, feels much better wants to go home today.     Objective   Blood pressure 175/82, pulse 78, temperature 98 F (36.7 C), temperature source Oral, resp. rate 18, height 5' (1.524 m), weight 53.615 kg (118 lb 3.2 oz), SpO2 97 %.  No intake or output data in the 24 hours ending 02/07/15 1347  Exam Awake Alert, Oriented x 3, No new F.N deficits, Normal affect [place].AT,PERRAL Supple Neck,No JVD, No cervical lymphadenopathy appriciated.  Symmetrical Chest wall movement, Good air movement bilaterally, CTAB RRR,No Gallops,Rubs or new Murmurs, No Parasternal Heave +ve B.Sounds, Abd Soft, Non tender, No organomegaly appriciated, No rebound -guarding or rigidity. No Cyanosis, Clubbing or edema, No new Rash or bruise   Data Review   CBC w Diff:  Lab Results  Component Value Date   WBC 8.6 02/04/2015   HGB 14.9 02/04/2015   HCT 44.6 02/04/2015   PLT 305 02/04/2015   LYMPHOPCT 18 02/04/2015   MONOPCT 9 02/04/2015   EOSPCT 0 02/04/2015   BASOPCT 1 02/04/2015    CMP:  Lab Results  Component Value Date   NA 142 02/04/2015   K 4.2 02/04/2015   CL 106 02/04/2015   CO2 27 02/04/2015   BUN 15 02/04/2015   CREATININE 0.68 02/04/2015   PROT 7.1 02/04/2015   ALBUMIN 3.9 02/04/2015   BILITOT 0.6 02/04/2015   ALKPHOS 62 02/04/2015   AST 28 02/04/2015   ALT 31 02/04/2015  . Lab Results  Component Value Date   CHOL 171 02/05/2015   HDL 33* 02/05/2015   LDLCALC 104* 02/05/2015   TRIG 170* 02/05/2015   CHOLHDL 5.2 02/05/2015    Lab Results  Component Value Date   HGBA1C 6.1* 02/05/2015     Total Time in preparing paper work, data evaluation and todays exam - 35 minutes  Thurnell Lose M.D on 02/07/2015 at 1:47 PM  [place]  [phone]

## 2015-02-07 NOTE — Discharge Instructions (Signed)
Follow with Primary MD Woody Seller, MD in 7 days   Get CBC, CMP, 2 view Chest X ray checked  by Primary MD next visit.    Activity: As tolerated with Full fall precautions use walker/cane & assistance as needed   Disposition Home     Diet:   Heart Healthy  with feeding assistance and aspiration precautions.  For Heart failure patients - Check your Weight same time everyday, if you gain over 2 pounds, or you develop in leg swelling, experience more shortness of breath or chest pain, call your Primary MD immediately. Follow Cardiac Low Salt Diet and 1.5 lit/day fluid restriction.   On your next visit with your primary care physician please Get Medicines reviewed and adjusted.   Please request your Prim.MD to go over all Hospital Tests and Procedure/Radiological results at the follow up, please get all Hospital records sent to your Prim MD by signing hospital release before you go home.   If you experience worsening of your admission symptoms, develop shortness of breath, life threatening emergency, suicidal or homicidal thoughts you must seek medical attention immediately by calling 911 or calling your MD immediately  if symptoms less severe.  You Must read complete instructions/literature along with all the possible adverse reactions/side effects for all the Medicines you take and that have been prescribed to you. Take any new Medicines after you have completely understood and accpet all the possible adverse reactions/side effects.   Do not drive, operating heavy machinery, perform activities at heights, swimming or participation in water activities or provide baby sitting services if your were admitted for syncope or siezures until you have seen by Primary MD or a Neurologist and advised to do so again.  Do not drive when taking Pain medications.    Do not take more than prescribed Pain, Sleep and Anxiety Medications  Special Instructions: If you have smoked or chewed Tobacco   in the last 2 yrs please stop smoking, stop any regular Alcohol  and or any Recreational drug use.  Wear Seat belts while driving.   Please note  You were cared for by a hospitalist during your hospital stay. If you have any questions about your discharge medications or the care you received while you were in the hospital after you are discharged, you can call the unit and asked to speak with the hospitalist on call if the hospitalist that took care of you is not available. Once you are discharged, your primary care physician will handle any further medical issues. Please note that NO REFILLS for any discharge medications will be authorized once you are discharged, as it is imperative that you return to your primary care physician (or establish a relationship with a primary care physician if you do not have one) for your aftercare needs so that they can reassess your need for medications and monitor your lab values.

## 2015-02-07 NOTE — Care Management Note (Signed)
Case Management Note  Patient Details  Name: Erica [name] MRN: [id number] Date of Birth: 1933-05-03  Subjective/Objective:   Patient admitted with CVA. Patient is from home alone.                  Action/Plan: Plan is to discharge patient home with home health PT safety visit of patients home. After home PT, patient is to have outpatient PT at [facility]. Neurology provided the patient with a prescription for outpatient PT. CM met with the patient and provided her a list of home health agencies in the [facility] area. She selected [place]. Tiffany with [facility] notified and accepted the referral for the safety PT visit. CM went over with the patient the plan of having home health come to her home initially for a safety visit of her environment. If this goes well, and they feel she is safe in her environment, she is to call [facility] and make an appointment and take the prescription with her to the outpatient PT appointment. Patient verbalized understanding. Bedside RN updated.    Expected Discharge Date:                  Expected Discharge Plan:  [place]  In-House Referral:     Discharge planning Services  CM Consult  Post Acute Care Choice:  Home Health Choice offered to:  Patient  DME Arranged:    DME Agency:     HH Arranged:  PT [place]:  [place]  Status of Service:  Completed, signed off  Medicare Important Message Given:  Yes Date Medicare IM Given:    Medicare IM give by:    Date Additional Medicare IM Given:    Additional Medicare Important Message give by:     If discussed at [place] of Stay Meetings, dates discussed:    Additional Comments:  Pollie Friar, RN 02/07/2015, 3:20 PM

## 2015-02-11 ENCOUNTER — Telehealth: Payer: Self-pay | Admitting: Cardiology

## 2015-02-11 NOTE — Telephone Encounter (Signed)
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 Patient states that her BP is down now but thanks me for letting her know.  She is seeing her PCP on Monday.

## 2015-02-11 NOTE — Telephone Encounter (Signed)
New message     Pt c/o BP issue: STAT if pt c/o blurred vision, one-sided weakness or slurred speech  1. What are your last 5 BP readings? 180/90 after mild exertion from fixing breakfast, resting 161/76 2. Are you having any other symptoms (ex. Dizziness, headache, blurred vision, passed out)? no 3. What is your BP issue? Home health nurse is concerned about elevated bp.  Please call pt at [phone]

## 2015-02-11 NOTE — Telephone Encounter (Signed)
Spoke with jim w/ PT - advised patient needs to discuss issue with her PCP. Informed him that I was unable to reach patient.  He told me that he just left there 5 minutes ago and her mobility is poor so may take her time to get to the phone. He is going to call her PCP to report issue and I will again try and reach patient at home.

## 2015-02-22 ENCOUNTER — Encounter: Payer: Medicare HMO | Admitting: Cardiology

## 2015-02-23 ENCOUNTER — Telehealth: Payer: Self-pay | Admitting: *Deleted

## 2015-02-23 NOTE — Telephone Encounter (Signed)
Informed patient that we will not need to see her in office for her LINQ monitor unless it reveals an abnormal heart rhthym.  Informed her office will contact her if anything shows up on monitor. Advised her to call office if she experiences any issues with her heart/rhythm.  Patient verbalized understanding and agreeable to plan.

## 2015-03-07 ENCOUNTER — Ambulatory Visit (INDEPENDENT_AMBULATORY_CARE_PROVIDER_SITE_OTHER): Payer: Medicare HMO | Admitting: *Deleted

## 2015-03-07 DIAGNOSIS — I639 Cerebral infarction, unspecified: Secondary | ICD-10-CM | POA: Diagnosis not present

## 2015-03-07 NOTE — Progress Notes (Signed)
[facility] Summary Report / Loop Recorder 

## 2015-03-09 LAB — CUP PACEART REMOTE DEVICE CHECK: MDC IDC SESS DTM: 20161208210649

## 2015-03-09 NOTE — Progress Notes (Signed)
Carelink summary report received. Battery status OK. Normal device function. No new symptom episodes, tachy episodes, brady, or pause episodes. No new AF episodes. Monthly summary reports and ROV/PRN 

## 2015-03-28 ENCOUNTER — Ambulatory Visit (INDEPENDENT_AMBULATORY_CARE_PROVIDER_SITE_OTHER): Payer: Medicare HMO | Admitting: Neurology

## 2015-03-28 ENCOUNTER — Telehealth: Payer: Self-pay | Admitting: Neurology

## 2015-03-28 ENCOUNTER — Encounter: Payer: Self-pay | Admitting: Neurology

## 2015-03-28 VITALS — BP 162/80 | HR 80 | Ht 59.0 in | Wt 127.0 lb

## 2015-03-28 DIAGNOSIS — I6381 Other cerebral infarction due to occlusion or stenosis of small artery: Secondary | ICD-10-CM

## 2015-03-28 DIAGNOSIS — I639 Cerebral infarction, unspecified: Secondary | ICD-10-CM

## 2015-03-28 NOTE — Progress Notes (Signed)
[facility] [address] [place]. [place] [address] [phone]       OFFICE FOLLOW-UP NOTE  Erica. Erica [name] Date of Birth:  07-Mar-1933 Medical Record Number:  [id number]   HPI: Erica [name] is an 82 year  Caucasian lady seen today for follow-up accompanied by her daughter after hospital admission for stroke in September 2016.Erica [name] is an 82 y.o. female who reports that on yesterday morning while talking to a friend on the phone she noted that she was unable to get her words out correctly for about 3 minutes. Her symptoms resolved spontaneously and she and her friend just laughed it off. She was able to talk normally last evening on a phone call before going to bed. This morning her first attempt at speech was with her prayer partner at about 10AM. Again she was unable to get the words out correctly. This episode lasted longer but again resolved on its own and by the time her partner left two hours later her speech was at baseline. Her children were called though and when her son arrived sometime after 1PM the patient was working in the yard. Again she had difficulty getting her words out. He noted that she was drooling and that she was moving slower than usual. EMS was called at that time. By the time EMS arrived (10 minutes) she was back to baseline and has had no recurrence of symptoms.  Date last known well: Date: 11/02/2014 Time last known well: Time: 22:00 tPA Given: No: Resolution of symptoms. CT scan of the head showed no acute abnormality and MRI scan showed a cluster of small acute infarcts in the central left MCA territory white matter as well as cortex with MRA showing high-grade distal M1 stenosis with left M2 branch occlusion on the left. There are also extensive posterior circulation atherosclerotic disease involving basilar and proximal PCA stenosis. Transthoracic echo showed ejection fraction of 55-60% without cardiac source of embolism.  Transesophageal echocardiogram was obtained which showed no evidence of PFO, intra-atrial clot or other cardiac source of embolism. EEG was obtained which was normal. Hemoglobin A1c was borderline at 6.4. LDL cholesterol was also borderline at 109 mg percent. Patient was recommended to be on dual antiplatelet therapy for intracranial stenosis and as well as had loop recorder placement for paroxysmal A. Fib. Patient states she's done well since discharge. She still has some intermittent word finding difficulties particularly when she is tired or gets excited and tries to walk talk fast. She has finished home speech therapy and is wondering if she needs outpatient. She's had no new stroke or TIA symptoms. She is tolerating aspirin and Plavix with only minor bruising and no bleeding. She wants to drive. Update 03/28/2015 : She returns for follow-up after last visit 3 months ago. She is accompanied by her daughter. She was readmitted in December following a fall at home and balance difficulties. MRI scan of the brain personally reviewed by me  showed acute right posterior limb internal capsule infarct extending into the brainstem. The previously seen left frontal and parietal infarcts were noted transthoracic echo showed normal ejection fraction. Loop recorder did not demonstrate atrial fibrillation. CT angiogram demonstrated moderate to severe stenosis of the left P2 segment and bilateral proximal M2 branches. LDL cholesterol was 104. Hemoglobin A1c was 6.4 in September 2016. Patient was discharged home and has finished home physical and occupation therapy. She uses a cane mainly for long distance but can walk short distances well. The  family feels that her judgment and memory are impaired and they do not trust her to drive. She lives independently though family lives nearby and provides support. The patient was placed on Lipitor which she did not tolerate and discontinued due to muscle aches and pains. She is instead  taking red yeast rice. She has a follow-up lipid profile pending later this week by her primary physician. She states she is careful with her walking and has had no further falls at home. ROS:   14 system review of systems is positive for   Speech difficulties , writing difficulties , decreased urination, joint pain, aching muscles, muscle cramps and all other systems negative  PMH:  Past Medical History  Diagnosis Date  . Spondylitis, ankylosing ([place])   . Anxiety     h/o curious anxiety   . Arthritis     osteoporosis, ankylosing spondylosis   . Cancer ([place])     basal cell- facial   . History of blood transfusion     for back surgery  . Osteoporosis   . Stroke [facility])     Social History:  Social History   Social History  . Marital Status: Legally Separated    Spouse Name: N/A  . Number of Children: N/A  . Years of Education: N/A   Occupational History  . Not on file.   Social History Main Topics  . Smoking status: Never Smoker   . Smokeless tobacco: Not on file  . Alcohol Use: No  . Drug Use: No  . Sexual Activity: Not on file   Other Topics Concern  . Not on file   Social History Narrative    Medications:   Current Outpatient Prescriptions on File Prior to Visit  Medication Sig Dispense Refill  . clopidogrel (PLAVIX) 75 MG tablet Take 1 tablet (75 mg total) by mouth daily. 90 tablet 0  . Digestive Enzymes CAPS Take 1 capsule by mouth 3 (three) times daily with meals.    . NONFORMULARY OR COMPOUNDED ITEM Outpatient PT for CVA 1 each 0  . OVER THE COUNTER MEDICATION Take 1 capsule by mouth 3 (three) times daily with meals. Amazing Grape take two capsule 3  Times a day    . OVER THE COUNTER MEDICATION Take 2 capsules by mouth daily. Cardio Relax AO - Before Breakfast    . OVER THE COUNTER MEDICATION Take 2 capsules by mouth daily. GSH-3 Cell Defense- Nordic Clinical- Selenium 278mcn, N-Acetyl-LCystene NAC 600mg  & Blood Orange complex 100mg     . OVER THE COUNTER  MEDICATION Take 1 tablet by mouth daily. [place] Probiotic Extra Strength-10 billion    . OVER THE COUNTER MEDICATION Take 1 capsule by mouth 2 (two) times daily. Triple Strength EPA (647 mg) & DHA (253 mg) Fish Oil Omega 3    . OVER THE COUNTER MEDICATION Take 1 capsule by mouth 3 (three) times daily. Joint Performance Plus-Best Health Nutritionals    . OVER THE COUNTER MEDICATION Take 1 tablet by mouth 3 (three) times daily. Thytrophin PMG (Thyroid) 100mg - Standard Process    . OVER THE COUNTER MEDICATION Take 1 tablet by mouth 2 (two) times daily. Prolamine Iodine (3mg )- Standard Process    . OVER THE COUNTER MEDICATION Take 3 tablets by mouth 3 (three) times daily. Drenamin (For Adrenals)- Standard Process    . OVER THE COUNTER MEDICATION Take 1 tablet by mouth 3 (three) times daily. Utrophin PMG (to replace uterus) - Standard Process    . OVER THE  COUNTER MEDICATION Take 1 tablet by mouth 3 (three) times daily. Ovex (To replace ovaries) - Standard Process    . OVER THE COUNTER MEDICATION Take 1 capsule by mouth 3 (three) times daily. Ostivone (Lpriflavone-enhances calcium absorption) - Enzymatic Therapy    . OVER THE COUNTER MEDICATION Take 1 capsule by mouth 2 (two) times daily. [place] ExtractPlus- S&L Natural Foods    . OVER THE COUNTER MEDICATION Take 1 capsule by mouth daily. Magnesium Citrate 160mg - Life Extension    . OVER THE COUNTER MEDICATION Take 1 capsule by mouth daily. Zinc Picolinate (30mg ) - Natures Life    . OVER THE COUNTER MEDICATION Take 2 tablets by mouth daily. Vitamin D3- 2,000 IU Carlson's    . OVER THE COUNTER MEDICATION Take 1 capsule by mouth 2 (two) times daily. Vitamin C with Bioflavonoids- 500mg     . OVER THE COUNTER MEDICATION Take 1 tablet by mouth daily. [id number] [id number] Folic Acid (123XX123 mcg 123456 / 2mg  B6 / A999333 mcg folic Acid)    . OVER THE COUNTER MEDICATION Take 2 capsules by mouth at bedtime. LunaFlex PM- Valerian Root (200 mg), Perluxan Hops (500mg ) UC-11  Standardized collagen (50mg )    . OVER THE COUNTER MEDICATION 2.5 mLs by Other route See admin instructions. Natural Progesterone Creme- 1/4 tsp= 20 mg of natural progesterone. 5 days a week, skip sat and sun.(Rub on body)    . OVER THE COUNTER MEDICATION 1.5 drops See admin instructions. V-HP- BioActive Homeopathic- When bacteria is present    . OVER THE COUNTER MEDICATION Take by mouth 4 (four) times daily - after meals and at bedtime. Hydrate ii- Bio Actuve Homeopathic 6 drops in water 4 times a day for dehydration     No current facility-administered medications on file prior to visit.    Allergies:   Allergies  Allergen Reactions  . Codeine Nausea Only    Physical Exam General: well developed, well nourished  Pleasant elderly Caucasian lady, seated, in no evident distress Head: head normocephalic and atraumatic.  Neck: supple with no carotid or supraclavicular bruits Cardiovascular: regular rate and rhythm, no murmurs Musculoskeletal: no deformity Skin:  no rash/petichiae Vascular:  Normal pulses all extremities Filed Vitals:   03/28/15 1650  BP: 162/80  Pulse: 80   Neurologic Exam Mental Status: Awake and fully alert. Oriented to place and time. Recent and remote memory intact. Attention span, concentration and fund of knowledge appropriate. Mood and affect appropriate.  Speech and language appear normal. No word finding difficulties, para physical errors or disfluency. Able to name and repeat quite well. Animal naming test 12. Recall 3/3. Cranial Nerves: Fundoscopic exam reveals sharp disc margins. Pupils equal, briskly reactive to light. Extraocular movements full without nystagmus. Visual fields full to confrontation. Hearing intact. Facial sensation intact. Face, tongue, palate moves normally and symmetrically.  Motor: Normal bulk and tone. Normal strength in all tested extremity muscles. Sensory.: intact to touch ,pinprick .position and vibratory sensation.  Coordination:  Rapid alternating movements normal in all extremities. Finger-to-nose and heel-to-shin performed accurately bilaterally. Gait and Station: Arises from chair without difficulty. Stance is normal. Gait demonstrates normal stride length and balance . Unable to heel, toe and tandem walk without difficulty.  Reflexes: 1+ and symmetric. Toes downgoing.   NIHSS  0 Modified Rankin  1   ASSESSMENT: 89 year Caucasian lady with left MCA branch infarct in September [id number] of embolic etiology without definite identified source. Vascular risk factors of hypertension  and intracranial atherosclerosis only. Recent readmission  and December 2016 with right subcortical infarct    PLAN:  I had a long d/w patient and daughter about her recent stroke, risk for recurrent stroke/TIAs, personally independently reviewed imaging studies and stroke evaluation results and answered questions.Continue clopidogrel 75 mg daily  for secondary stroke prevention and maintain strict control of hypertension with blood pressure goal below 130/90, diabetes with hemoglobin A1c goal below 6.5% and lipids with LDL cholesterol goal below 70 mg/dL. I also advised the patient to eat a healthy diet with plenty of whole grains, cereals, fruits and vegetables, exercise regularly and maintain ideal body weight. She was advised not to drive till the next follow-up visit. Followup in the future with nurse practitioner in 3 months.  Antony Contras, MD  Note: This document was prepared with digital dictation and possible smart phrase technology. Any transcriptional errors that result from this process are unintentional

## 2015-03-28 NOTE — Patient Instructions (Signed)
I had a long d/w patient and daughter about her recent stroke, risk for recurrent stroke/TIAs, personally independently reviewed imaging studies and stroke evaluation results and answered questions.Continue clopidogrel 75 mg daily  for secondary stroke prevention and maintain strict control of hypertension with blood pressure goal below 130/90, diabetes with hemoglobin A1c goal below 6.5% and lipids with LDL cholesterol goal below 70 mg/dL. I also advised the patient to eat a healthy diet with plenty of whole grains, cereals, fruits and vegetables, exercise regularly and maintain ideal body weight. She was advised not to drive till the next follow-up visit. Followup in the future with nurse practitioner in 3 months. Stroke Prevention Some medical conditions and behaviors are associated with an increased chance of having a stroke. You may prevent a stroke by making healthy choices and managing medical conditions. HOW CAN I REDUCE MY RISK OF HAVING A STROKE?   Stay physically active. Get at least 30 minutes of activity on most or all days.  Do not smoke. It may also be helpful to avoid exposure to secondhand smoke.  Limit alcohol use. Moderate alcohol use is considered to be:  No more than 2 drinks per day for men.  No more than 1 drink per day for nonpregnant women.  Eat healthy foods. This involves:  Eating 5 or more servings of fruits and vegetables a day.  Making dietary changes that address high blood pressure (hypertension), high cholesterol, diabetes, or obesity.  Manage your cholesterol levels.  Making food choices that are high in fiber and low in saturated fat, trans fat, and cholesterol may control cholesterol levels.  Take any prescribed medicines to control cholesterol as directed by your health care provider.  Manage your diabetes.  Controlling your carbohydrate and sugar intake is recommended to manage diabetes.  Take any prescribed medicines to control diabetes as  directed by your health care provider.  Control your hypertension.  Making food choices that are low in salt (sodium), saturated fat, trans fat, and cholesterol is recommended to manage hypertension.  Ask your health care provider if you need treatment to lower your blood pressure. Take any prescribed medicines to control hypertension as directed by your health care provider.  If you are 70-4 years of age, have your blood pressure checked every 3-5 years. If you are 45 years of age or older, have your blood pressure checked every year.  Maintain a healthy weight.  Reducing calorie intake and making food choices that are low in sodium, saturated fat, trans fat, and cholesterol are recommended to manage weight.  Stop drug abuse.  Avoid taking birth control pills.  Talk to your health care provider about the risks of taking birth control pills if you are over 63 years old, smoke, get migraines, or have ever had a blood clot.  Get evaluated for sleep disorders (sleep apnea).  Talk to your health care provider about getting a sleep evaluation if you snore a lot or have excessive sleepiness.  Take medicines only as directed by your health care provider.  For some people, aspirin or blood thinners (anticoagulants) are helpful in reducing the risk of forming abnormal blood clots that can lead to stroke. If you have the irregular heart rhythm of atrial fibrillation, you should be on a blood thinner unless there is a good reason you cannot take them.  Understand all your medicine instructions.  Make sure that other conditions (such as anemia or atherosclerosis) are addressed. SEEK IMMEDIATE MEDICAL CARE IF:   You have  sudden weakness or numbness of the face, arm, or leg, especially on one side of the body.  Your face or eyelid droops to one side.  You have sudden confusion.  You have trouble speaking (aphasia) or understanding.  You have sudden trouble seeing in one or both  eyes.  You have sudden trouble walking.  You have dizziness.  You have a loss of balance or coordination.  You have a sudden, severe headache with no known cause.  You have new chest pain or an irregular heartbeat. Any of these symptoms may represent a serious problem that is an emergency. Do not wait to see if the symptoms will go away. Get medical help at once. Call your local emergency services (911 in [place] Do not drive yourself to the hospital.   This information is not intended to replace advice given to you by your health care provider. Make sure you discuss any questions you have with your health care provider.   Document Released: 03/22/2004 Document Revised: 03/05/2014 Document Reviewed: 08/15/2012 Elsevier Interactive Patient Education [employer].
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
## 2015-03-28 NOTE — Telephone Encounter (Signed)
RN talk to patients son [name] about his moms driving. The family has concerns that's its unsafe for her to drive. Rn stated Dr.Sethi will know ahead of time.
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
## 2015-04-04 ENCOUNTER — Ambulatory Visit (INDEPENDENT_AMBULATORY_CARE_PROVIDER_SITE_OTHER): Payer: Medicare HMO | Admitting: *Deleted

## 2015-04-04 DIAGNOSIS — I639 Cerebral infarction, unspecified: Secondary | ICD-10-CM | POA: Diagnosis not present

## 2015-04-05 NOTE — Progress Notes (Signed)
[facility] Summary Report / Loop Recorder 

## 2015-04-18 LAB — CUP PACEART REMOTE DEVICE CHECK: MDC IDC SESS DTM: 20170107210735

## 2015-04-18 NOTE — Progress Notes (Signed)
Carelink summary report received. Battery status OK. Normal device function. No new symptom episodes, tachy episodes, brady, or pause episodes. No new AF episodes. Monthly summary reports and ROV/PRN 

## 2015-05-03 LAB — CUP PACEART REMOTE DEVICE CHECK: MDC IDC SESS DTM: 20170206213812

## 2015-05-03 NOTE — Progress Notes (Signed)
Carelink summary report received. Battery status OK. Normal device function. No new symptom episodes, tachy episodes, brady, or pause episodes. No new AF episodes. Monthly summary reports and ROV/PRN 

## 2015-05-04 ENCOUNTER — Ambulatory Visit (INDEPENDENT_AMBULATORY_CARE_PROVIDER_SITE_OTHER): Payer: Medicare HMO | Admitting: *Deleted

## 2015-05-04 DIAGNOSIS — I639 Cerebral infarction, unspecified: Secondary | ICD-10-CM

## 2015-05-06 NOTE — Progress Notes (Signed)
[facility] Summary Report / Loop Recorder 

## 2015-05-07 LAB — CUP PACEART REMOTE DEVICE CHECK: MDC IDC SESS DTM: 20170308213626

## 2015-05-07 NOTE — Progress Notes (Signed)
Carelink summary report received. Battery status OK. Normal device function. No new symptom episodes, tachy episodes, brady, or pause episodes. No new AF episodes. Monthly summary reports and ROV/PRN 

## 2015-06-03 ENCOUNTER — Ambulatory Visit (INDEPENDENT_AMBULATORY_CARE_PROVIDER_SITE_OTHER): Payer: Medicare HMO | Admitting: *Deleted

## 2015-06-03 DIAGNOSIS — I639 Cerebral infarction, unspecified: Secondary | ICD-10-CM

## 2015-06-03 NOTE — Progress Notes (Signed)
[facility] Summary Report / Loop Recorder 

## 2015-06-27 ENCOUNTER — Encounter: Payer: Self-pay | Admitting: Nurse Practitioner

## 2015-06-27 ENCOUNTER — Ambulatory Visit (INDEPENDENT_AMBULATORY_CARE_PROVIDER_SITE_OTHER): Payer: Medicare HMO | Admitting: Nurse Practitioner

## 2015-06-27 VITALS — BP 150/80 | HR 76 | Resp 20 | Ht <= 58 in | Wt 128.0 lb

## 2015-06-27 DIAGNOSIS — I639 Cerebral infarction, unspecified: Secondary | ICD-10-CM | POA: Diagnosis not present

## 2015-06-27 DIAGNOSIS — I10 Essential (primary) hypertension: Secondary | ICD-10-CM

## 2015-06-27 NOTE — Progress Notes (Signed)
[facility]  PATIENT: Erica [name] DOB: August 09, 1933   REASON FOR VISIT: Follow-up for lacunar infarct 12/16 previous stroke in August 2016 HISTORY FROM: Patient    HISTORY OF PRESENT ILLNESS: HISTORY PSMs Erica [name] is a 80 year Caucasian lady seen today for follow-up accompanied by her daughter after hospital admission for stroke in September 2016.Erica [name] is an 82 y.o. female who reports that on yesterday morning while talking to a friend on the phone she noted that she was unable to get her words out correctly for about 3 minutes. Her symptoms resolved spontaneously and she and her friend just laughed it off. She was able to talk normally last evening on a phone call before going to bed. This morning her first attempt at speech was with her prayer partner at about 10AM. Again she was unable to get the words out correctly. This episode lasted longer but again resolved on its own and by the time her partner left two hours later her speech was at baseline. Her children were called though and when her son arrived sometime after 1PM the patient was working in the yard. Again she had difficulty getting her words out. He noted that she was drooling and that she was moving slower than usual. EMS was called at that time. By the time EMS arrived (10 minutes) she was back to baseline and has had no recurrence of symptoms.  Date last known well: Date: 11/02/2014 Time last known well: Time: 22:00 tPA Given: No: Resolution of symptoms. CT scan of the head showed no acute abnormality and MRI scan showed a cluster of small acute infarcts in the central left MCA territory white matter as well as cortex with MRA showing high-grade distal M1 stenosis with left M2 branch occlusion on the left. There are also extensive posterior circulation atherosclerotic disease involving basilar and proximal PCA stenosis. Transthoracic echo showed ejection fraction of 55-60% without cardiac source  of embolism. Transesophageal echocardiogram was obtained which showed no evidence of PFO, intra-atrial clot or other cardiac source of embolism. EEG was obtained which was normal. Hemoglobin A1c was borderline at 6.4. LDL cholesterol was also borderline at 109 mg percent. Patient was recommended to be on dual antiplatelet therapy for intracranial stenosis and as well as had loop recorder placement for paroxysmal A. Fib. Patient states she's done well since discharge. She still has some intermittent word finding difficulties particularly when she is tired or gets excited and tries to walk talk fast. She has finished home speech therapy and is wondering if she needs outpatient. She's had no new stroke or TIA symptoms. She is tolerating aspirin and Plavix with only minor bruising and no bleeding. She wants to drive. Update 1/30/2017PS : She returns for follow-up after last visit 3 months ago. She is accompanied by her daughter. She was readmitted in December following a fall at home and balance difficulties. MRI scan of the brain personally reviewed by me showed acute right posterior limb internal capsule infarct extending into the brainstem. The previously seen left frontal and parietal infarcts were noted transthoracic echo showed normal ejection fraction. Loop recorder did not demonstrate atrial fibrillation. CT angiogram demonstrated moderate to severe stenosis of the left P2 segment and bilateral proximal M2 branches. LDL cholesterol was 104. Hemoglobin A1c was 6.4 in September 2016. Patient was discharged home and has finished home physical and occupation therapy. She uses a cane mainly for long distance but can walk short distances well. The family feels that  her judgment and memory are impaired and they do not trust her to drive. She lives independently though family lives nearby and provides support. The patient was placed on Lipitor which she did not tolerate and discontinued due to muscle aches and pains.  She is instead taking red yeast rice. She has a follow-up lipid profile pending later this week by her primary physician. She states she is careful with her walking and has had no further falls at home. UPDATE 05/01/2017CM Erica [name], 82 year old female returns for follow-up for stroke. She has not had further stroke or TIA symptoms is currently on Plavix. She continues to live alone and there have been no safety issues identified. She wears Lifeline. Recent LDL was 79 HDL 50 and triglycerides 180. She is continuing to exercise and has had no recent falls. She has a loop recorder in place without episodes of atrial fibrillation identified. She returns for reevaluation  REVIEW OF SYSTEMS: Full 14 system review of systems performed and notable only for those listed, all others are neg:  Constitutional: neg  Cardiovascular: neg Ear/Nose/Throat: neg  Skin: neg Eyes: neg Respiratory: neg Gastroitestinal: neg  Hematology/Lymphatic: neg  Endocrine: neg Musculoskeletal:neg Allergy/Immunology: neg Neurological: neg Psychiatric: neg Sleep : neg   ALLERGIES: Allergies  Allergen Reactions  . Codeine Nausea Only    HOME MEDICATIONS: Outpatient Prescriptions Prior to Visit  Medication Sig Dispense Refill  . clopidogrel (PLAVIX) 75 MG tablet Take 1 tablet (75 mg total) by mouth daily. 90 tablet 0  . Digestive Enzymes CAPS Take 1 capsule by mouth 3 (three) times daily with meals.    . NONFORMULARY OR COMPOUNDED ITEM Outpatient PT for CVA 1 each 0  . OVER THE COUNTER MEDICATION Take 1 capsule by mouth 3 (three) times daily with meals. Amazing Grape take two capsule 3  Times a day    . OVER THE COUNTER MEDICATION Take 2 capsules by mouth daily. Cardio Relax AO - Before Breakfast    . OVER THE COUNTER MEDICATION Take 2 capsules by mouth daily. GSH-3 Cell Defense- Nordic Clinical- Selenium 258mcn, N-Acetyl-LCystene NAC 600mg  & Blood Orange complex 100mg     . OVER THE COUNTER MEDICATION Take 1 tablet by  mouth daily. [place] Probiotic Extra Strength-10 billion    . OVER THE COUNTER MEDICATION Take 1 capsule by mouth 2 (two) times daily. Triple Strength EPA (647 mg) & DHA (253 mg) Fish Oil Omega 3    . OVER THE COUNTER MEDICATION Take 1 capsule by mouth 3 (three) times daily. Joint Performance Plus-Best Health Nutritionals    . OVER THE COUNTER MEDICATION Take 1 tablet by mouth 3 (three) times daily. Thytrophin PMG (Thyroid) 100mg - Standard Process    . OVER THE COUNTER MEDICATION Take 1 tablet by mouth 2 (two) times daily. Prolamine Iodine (3mg )- Standard Process    . OVER THE COUNTER MEDICATION Take 3 tablets by mouth 3 (three) times daily. Drenamin (For Adrenals)- Standard Process    . OVER THE COUNTER MEDICATION Take 1 tablet by mouth 3 (three) times daily. Utrophin PMG (to replace uterus) - Standard Process    . OVER THE COUNTER MEDICATION Take 1 tablet by mouth 3 (three) times daily. Ovex (To replace ovaries) - Standard Process    . OVER THE COUNTER MEDICATION Take 1 capsule by mouth 3 (three) times daily. Ostivone (Lpriflavone-enhances calcium absorption) - Enzymatic Therapy    . OVER THE COUNTER MEDICATION Take 1 capsule by mouth 2 (two) times daily. Billberry ExtractPlus- S&L Natural Foods    .  OVER THE COUNTER MEDICATION Take 1 capsule by mouth daily. Magnesium Citrate 160mg - Life Extension    . OVER THE COUNTER MEDICATION Take 1 capsule by mouth daily. Zinc Picolinate (30mg ) - Natures Life    . OVER THE COUNTER MEDICATION Take 2 tablets by mouth daily. Vitamin D3- 2,000 IU Carlson's    . OVER THE COUNTER MEDICATION Take 1 capsule by mouth 2 (two) times daily. Vitamin C with Bioflavonoids- 500mg     . OVER THE COUNTER MEDICATION Take 1 tablet by mouth daily. [id number] Folic Acid (123XX123 mcg 123456 / 2mg  B6 / A999333 mcg folic Acid)    . OVER THE COUNTER MEDICATION Take 2 capsules by mouth at bedtime. LunaFlex PM- Valerian Root (200 mg), Perluxan Hops (500mg ) UC-11 Standardized collagen (50mg )    .  OVER THE COUNTER MEDICATION 2.5 mLs by Other route See admin instructions. Natural Progesterone Creme- 1/4 tsp= 20 mg of natural progesterone. 5 days a week, skip sat and sun.(Rub on body)    . OVER THE COUNTER MEDICATION 1.5 drops See admin instructions. V-HP- BioActive Homeopathic- When bacteria is present    . OVER THE COUNTER MEDICATION Take by mouth 4 (four) times daily - after meals and at bedtime. Hydrate ii- Bio Actuve Homeopathic 6 drops in water 4 times a day for dehydration    . Red Yeast Rice Extract (RED YEAST RICE PO) Take by mouth. Take 2 capsule twice a day    . UNABLE TO FIND cyruta plus take  2 tabs three times a dAY FOR STROKE PREVENTION    . UNABLE TO FIND PHYTOSTEROL FORMULA TAKE 2 CAPSULE TWO TIMES A DAY    . UNABLE TO FIND SPANISH BLACK RADISH TO ELIMINATE LIPITOR SIDE EFFECTS     No facility-administered medications prior to visit.    PAST MEDICAL HISTORY: Past Medical History  Diagnosis Date  . Spondylitis, ankylosing ([place])   . Anxiety     h/o curious anxiety   . Arthritis     osteoporosis, ankylosing spondylosis   . Cancer ([place])     basal cell- facial   . History of blood transfusion     for back surgery  . Osteoporosis   . Stroke [facility])     PAST SURGICAL HISTORY: Past Surgical History  Procedure Laterality Date  . Abdominal hysterectomy    . Hardware removal N/A 10/23/2013    Procedure: HARDWARE REMOVAL;  Surgeon: Kristeen Miss, MD;  Location: [facility];  Service: Neurosurgery;  Laterality: N/A;  HARDWARE REMOVAL  . Craniotomy Right 10/06/2013    Procedure: CRANIECTOMY HEMATOMA EVACUATION SUBDURAL, PLACEMENT OF SKULL FLAP IN ABDOMEN;  Surgeon: Kristeen Miss, MD;  Location: [place];  Service: Neurosurgery;  Laterality: Right;  . Posterior lumbar fusion N/A 10/06/2013    Procedure: POSTERIOR LUMBAR FUSION  lumbar one/two;  Surgeon: Kristeen Miss, MD;  Location: [place] [facility];  Service: Neurosurgery;  Laterality: N/A;  . Cranioplasty N/A 01/11/2014     Procedure: CRANIOPLASTY FROM FLAP IN ABDOMEN;  Surgeon: Kristeen Miss, MD;  Location: [place] [facility];  Service: Neurosurgery;  Laterality: N/A;  . Ep implantable device N/A 11/05/2014    Procedure: Loop Recorder Insertion;  Surgeon: Will Meredith Leeds, MD;  Location: [place] [facility] LAB;  Service: Cardiovascular;  Laterality: N/A;  . Tee without cardioversion N/A 11/05/2014    Procedure: TRANSESOPHAGEAL ECHOCARDIOGRAM (TEE);  Surgeon: Thayer Headings, MD;  Location: [place];  Service: Cardiovascular;  Laterality: N/A;    FAMILY HISTORY: Family History  Problem Relation Age of Onset  . Stroke Mother   . Stroke Father     SOCIAL HISTORY: Social History   Social History  . Marital Status: Legally Separated    Spouse Name: N/A  . Number of Children: N/A  . Years of Education: N/A   Occupational History  . Not on file.   Social History Main Topics  . Smoking status: Never Smoker   . Smokeless tobacco: Not on file  . Alcohol Use: No  . Drug Use: No  . Sexual Activity: Not on file   Other Topics Concern  . Not on file   Social History Narrative     PHYSICAL EXAM  Filed Vitals:   06/27/15 1336  BP: 160/78  Pulse: 76  Resp: 20  Height: 4\' 10"  (1.473 m)  Weight: 128 lb (58.06 kg)   Body mass index is 26.76 kg/(m^2). General: well developed, well nourished Pleasant elderly Caucasian lady, seated, in no evident distress Head: head normocephalic and atraumatic.  Neck: supple with no carotid bruits Cardiovascular: regular rate and rhythm, no murmurs Musculoskeletal: no deformity Skin: no rash/petichiae Vascular: Normal pulses all extremities   Neurological examination   Mentation:Awake and fully alert. Oriented to place and time. Recent and remote memory intact. Attention span, concentration and fund of knowledge appropriate. Mood and affect appropriate. Speech and language appear normal. No word finding difficulties, para physical errors or disfluency. Able to name  and repeat quite well.  Cranial Nerves: Fundoscopic exam reveals sharp disc margins. Pupils equal, briskly reactive to light. Extraocular movements full without nystagmus. Visual fields full to confrontation. Hearing intact. Facial sensation intact. Face, tongue, palate moves normally and symmetrically.  Motor: Normal bulk and tone. Normal strength in all tested extremity muscles. Sensory.: intact to touch ,pinprick .position and vibratory sensation.  Coordination: Rapid alternating movements normal in all extremities. Finger-to-nose and heel-to-shin performed accurately bilaterally. Gait and Station: Arises from chair without difficulty. Stance is normal. Gait demonstrates normal stride length and balance . Able to heel, toe and tandem walk without difficulty.  Reflexes: 1+ and symmetric. Toes downgoing.  DIAGNOSTIC DATA (LABS, IMAGING, TESTING) - I reviewed patient records, labs, notes, testing and imaging myself where available.  Lab Results  Component Value Date   WBC 8.6 02/04/2015   HGB 14.9 02/04/2015   HCT 44.6 02/04/2015   MCV 88.1 02/04/2015   PLT 305 02/04/2015      Component Value Date/Time   NA 142 02/04/2015 1215   K 4.2 02/04/2015 1215   CL 106 02/04/2015 1215   CO2 27 02/04/2015 1215   GLUCOSE 125* 02/04/2015 1215   BUN 15 02/04/2015 1215   CREATININE 0.68 02/04/2015 1215   CALCIUM 9.6 02/04/2015 1215   PROT 7.1 02/04/2015 1215   ALBUMIN 3.9 02/04/2015 1215   AST 28 02/04/2015 1215   ALT 31 02/04/2015 1215   ALKPHOS 62 02/04/2015 1215   BILITOT 0.6 02/04/2015 1215   GFRNONAA >60 02/04/2015 1215   GFRAA >60 02/04/2015 1215   Lab Results  Component Value Date   CHOL 171 02/05/2015   HDL 33* 02/05/2015   LDLCALC 104* 02/05/2015   TRIG 170* 02/05/2015   CHOLHDL 5.2 02/05/2015   Lab Results  Component Value Date   HGBA1C 6.1* 02/05/2015   No results found for: [id number] Lab Results  Component Value Date   TSH 2.300 11/03/2014      ASSESSMENT AND  PLAN 36 year Caucasian lady with left MCA branch infarct in September 2016  of embolic etiology without definite identified source. Vascular risk factors of hypertension and intracranial atherosclerosis only. Recent readmission and December 2016 with right subcortical infarct.No further stroke or TIA symptoms since that time. The patient is a current patient of Dr. Leonie Man who is out of the office today . This note is sent to the work in doctor.       PLAN: Continue clopidogrel 75 mg daily for secondary stroke prevention maintain strict control of hypertension with blood pressure goal below 130/90, todays 150/80 diabetes with hemoglobin A1c goal below 6.5% lipids with LDL cholesterol goal below 70 mg/dL Most recent 79 Eat a healthy diet with plenty of whole grains, cereals, fruits and vegetables, exercise regularly and maintain ideal body weight.  May resume driving Followup in the future in 6 months Dennie Bible, [facility], [facility], APRN  [facility] [address] [place], [place] [address] [phone]

## 2015-06-27 NOTE — Progress Notes (Signed)
I agree with the assessment and plan as directed by NP .The patient is known to me .   Pola Furno, MD  

## 2015-06-27 NOTE — Patient Instructions (Addendum)
Continue clopidogrel 75 mg daily for secondary stroke prevention maintain strict control of hypertension with blood pressure goal below 130/90, todays 150/80 diabetes with hemoglobin A1c goal below 6.5% lipids with LDL cholesterol goal below 70 mg/dLMost recent 79 Eat a healthy diet with plenty of whole grains, cereals, fruits and vegetables, exercise regularly and maintain ideal body weight.  May resume driving Followup in the future in 6 months

## 2015-07-04 ENCOUNTER — Ambulatory Visit (INDEPENDENT_AMBULATORY_CARE_PROVIDER_SITE_OTHER): Payer: Medicare HMO | Admitting: *Deleted

## 2015-07-04 DIAGNOSIS — I639 Cerebral infarction, unspecified: Secondary | ICD-10-CM

## 2015-07-05 NOTE — Progress Notes (Signed)
[facility] Summary Report / Loop Recorder 

## 2015-07-23 LAB — CUP PACEART REMOTE DEVICE CHECK: Date Time Interrogation Session: 20170407220653

## 2015-07-23 NOTE — Progress Notes (Signed)
Carelink summary report received. Battery status OK. Normal device function. No new symptom episodes, tachy episodes, brady, or pause episodes. No new AF episodes. Monthly summary reports and ROV/PRN 

## 2015-08-02 ENCOUNTER — Ambulatory Visit (INDEPENDENT_AMBULATORY_CARE_PROVIDER_SITE_OTHER): Payer: Medicare HMO | Admitting: *Deleted

## 2015-08-02 DIAGNOSIS — I639 Cerebral infarction, unspecified: Secondary | ICD-10-CM

## 2015-08-03 NOTE — Progress Notes (Signed)
[facility] Summary Report / Loop Recorder 

## 2015-08-10 LAB — CUP PACEART REMOTE DEVICE CHECK: Date Time Interrogation Session: 20170507223609

## 2015-08-31 LAB — CUP PACEART REMOTE DEVICE CHECK: MDC IDC SESS DTM: 20170606223741

## 2015-09-01 ENCOUNTER — Ambulatory Visit (INDEPENDENT_AMBULATORY_CARE_PROVIDER_SITE_OTHER): Payer: Medicare HMO | Admitting: *Deleted

## 2015-09-01 DIAGNOSIS — I639 Cerebral infarction, unspecified: Secondary | ICD-10-CM | POA: Diagnosis not present

## 2015-09-02 NOTE — Progress Notes (Signed)
[facility] Summary Report / Loop Recorder 

## 2015-09-15 LAB — CUP PACEART REMOTE DEVICE CHECK: MDC IDC SESS DTM: 20170706230542

## 2015-10-03 ENCOUNTER — Ambulatory Visit (INDEPENDENT_AMBULATORY_CARE_PROVIDER_SITE_OTHER): Payer: Medicare HMO | Admitting: *Deleted

## 2015-10-03 DIAGNOSIS — I639 Cerebral infarction, unspecified: Secondary | ICD-10-CM | POA: Diagnosis not present

## 2015-10-03 NOTE — Progress Notes (Signed)
[facility] Summary Report / Loop Recorder 

## 2015-10-10 LAB — CUP PACEART REMOTE DEVICE CHECK: Date Time Interrogation Session: 20170805231341

## 2015-11-01 ENCOUNTER — Ambulatory Visit (INDEPENDENT_AMBULATORY_CARE_PROVIDER_SITE_OTHER): Payer: Medicare HMO | Admitting: *Deleted

## 2015-11-01 DIAGNOSIS — I639 Cerebral infarction, unspecified: Secondary | ICD-10-CM | POA: Diagnosis not present

## 2015-11-01 NOTE — Progress Notes (Signed)
[facility] Summary Report / Loop Recorder 

## 2015-11-26 LAB — CUP PACEART REMOTE DEVICE CHECK: MDC IDC SESS DTM: 20170904233614

## 2015-11-26 NOTE — Progress Notes (Signed)
Carelink summary report received. Battery status OK. Normal device function. No new symptom episodes, tachy episodes, brady, or pause episodes. No new AF episodes. Monthly summary reports and ROV/PRN 

## 2015-11-30 ENCOUNTER — Ambulatory Visit (INDEPENDENT_AMBULATORY_CARE_PROVIDER_SITE_OTHER): Payer: Medicare HMO | Admitting: *Deleted

## 2015-11-30 DIAGNOSIS — I639 Cerebral infarction, unspecified: Secondary | ICD-10-CM | POA: Diagnosis not present

## 2015-12-01 NOTE — Progress Notes (Signed)
[facility] Summary Report / Loop Recorder 

## 2015-12-30 ENCOUNTER — Ambulatory Visit (INDEPENDENT_AMBULATORY_CARE_PROVIDER_SITE_OTHER): Payer: Medicare HMO | Admitting: *Deleted

## 2015-12-30 DIAGNOSIS — I639 Cerebral infarction, unspecified: Secondary | ICD-10-CM

## 2016-01-02 ENCOUNTER — Ambulatory Visit (INDEPENDENT_AMBULATORY_CARE_PROVIDER_SITE_OTHER): Payer: Medicare HMO | Admitting: Nurse Practitioner

## 2016-01-02 ENCOUNTER — Encounter: Payer: Self-pay | Admitting: Nurse Practitioner

## 2016-01-02 VITALS — BP 140/80 | HR 80 | Ht <= 58 in | Wt 132.6 lb

## 2016-01-02 DIAGNOSIS — I63412 Cerebral infarction due to embolism of left middle cerebral artery: Secondary | ICD-10-CM

## 2016-01-02 DIAGNOSIS — I10 Essential (primary) hypertension: Secondary | ICD-10-CM

## 2016-01-02 DIAGNOSIS — E785 Hyperlipidemia, unspecified: Secondary | ICD-10-CM | POA: Diagnosis not present

## 2016-01-02 NOTE — Patient Instructions (Addendum)
Keep systolic blood pressure less than 130, today's reading  140/80  Continue taking blood pressure at home Lipids are followed by Dr. Redmond Pulling most recent LDL 79 Continue Plavix for  secondary stroke prevention No further stroke or TIA symptoms since December  2016 If recurrent stroke symptoms occur, call 911 and proceed to the hospital Discharge from neurologic services at this time

## 2016-01-02 NOTE — Progress Notes (Signed)
andis(  [facility]  PATIENT: Erica [name] DOB: 02/14/1934   REASON FOR VISIT: Follow-up for lacunar infarct 12/16 previous stroke in August 2016 Erica FROM: Patient    Erica OF PRESENT ILLNESS: Erica [name] is a 89 year Caucasian lady seen today for follow-up accompanied by her daughter after hospital admission for stroke in September 2016.Erica [name] is an 82 y.o. female who reports that on yesterday morning while talking to a friend on the phone she noted that she was unable to get her words out correctly for about 3 minutes. Her symptoms resolved spontaneously and she and her friend just laughed it off. She was able to talk normally last evening on a phone call before going to bed. This morning her first attempt at speech was with her prayer partner at about 10AM. Again she was unable to get the words out correctly. This episode lasted longer but again resolved on its own and by the time her partner left two hours later her speech was at baseline. Her children were called though and when her son arrived sometime after 1PM the patient was working in the yard. Again she had difficulty getting her words out. He noted that she was drooling and that she was moving slower than usual. EMS was called at that time. By the time EMS arrived (10 minutes) she was back to baseline and has had no recurrence of symptoms.  Date last known well: Date: 11/02/2014 Time last known well: Time: 22:00 tPA Given: No: Resolution of symptoms. CT scan of the head showed no acute abnormality and MRI scan showed a cluster of small acute infarcts in the central left MCA territory white matter as well as cortex with MRA showing high-grade distal M1 stenosis with left M2 branch occlusion on the left. There are also extensive posterior circulation atherosclerotic disease involving basilar and proximal PCA stenosis. Transthoracic echo showed ejection fraction of 55-60% without cardiac  source of embolism. Transesophageal echocardiogram was obtained which showed no evidence of PFO, intra-atrial clot or other cardiac source of embolism. EEG was obtained which was normal. Hemoglobin A1c was borderline at 6.4. LDL cholesterol was also borderline at 109 mg percent. Patient was recommended to be on dual antiplatelet therapy for intracranial stenosis and as well as had loop recorder placement for paroxysmal A. Fib. Patient states she's done well since discharge. She still has some intermittent word finding difficulties particularly when she is tired or gets excited and tries to walk talk fast. She has finished home speech therapy and is wondering if she needs outpatient. She's had no new stroke or TIA symptoms. She is tolerating aspirin and Plavix with only minor bruising and no bleeding. She wants to drive. Update 1/30/2017PS : She returns for follow-up after last visit 3 months ago. She is accompanied by her daughter. She was readmitted in December following a fall at home and balance difficulties. MRI scan of the brain personally reviewed by me showed acute right posterior limb internal capsule infarct extending into the brainstem. The previously seen left frontal and parietal infarcts were noted transthoracic echo showed normal ejection fraction. Loop recorder did not demonstrate atrial fibrillation. CT angiogram demonstrated moderate to severe stenosis of the left P2 segment and bilateral proximal M2 branches. LDL cholesterol was 104. Hemoglobin A1c was 6.4 in September 2016. Patient was discharged home and has finished home physical and occupation therapy. She uses a cane mainly for long distance but can walk short distances well. The family feels  that her judgment and memory are impaired and they do not trust her to drive. She lives independently though family lives nearby and provides support. The patient was placed on Lipitor which she did not tolerate and discontinued due to muscle aches and  pains. She is instead taking red yeast rice. She has a follow-up lipid profile pending later this week by her primary physician. She states she is careful with her walking and has had no further falls at home. UPDATE 05/01/2017CM Erica [name], 82 year old female returns for follow-up for stroke. She has not had further stroke or TIA symptoms is currently on Plavix. She continues to live alone and there have been no safety issues identified. She wears Lifeline. Recent LDL was 79 HDL 50 and triglycerides 180. She is continuing to exercise and has had no recent falls. She has a loop recorder in place without episodes of atrial fibrillation identified. She returns for reevaluation UPDATE 11/06/2017CM Erica [name], 83 year old female returns for follow-up. She has a Erica of lacunar infarct times 2 the last being December 2016. She is currently on Plavix without further stroke or TIA symptoms. She does not have any bruising or bleeding. Lipids are followed by her primary care and she has refused statins in the past due to myalgias. She is currently on Red yeast  Rice. Her last LDL was 79. She continues to take her blood pressure at home and her systolic is usually Q000111Q. Loop recorder has not shown episodes of atrial fibrillation. She continues to walk every day for 30 minutes. She is driving without difficulty. She is independent in all activities of daily living. She lives alone and has Lifeline. No falls no balance issues She returns for reevaluation REVIEW OF SYSTEMS: Full 14 system review of systems performed and notable only for those listed, all others are neg:  Constitutional: neg  Cardiovascular: neg Ear/Nose/Throat: neg  Skin: neg Eyes: neg Respiratory: neg Gastroitestinal: neg  Hematology/Lymphatic: neg  Endocrine: neg Musculoskeletal:neg Allergy/Immunology: Environmental allergies  Neurological: neg Psychiatric: neg Sleep : neg   ALLERGIES: Allergies  Allergen Reactions  . Codeine Nausea  Only    HOME MEDICATIONS: Outpatient Medications Prior to Visit  Medication Sig Dispense Refill  . clopidogrel (PLAVIX) 75 MG tablet Take 1 tablet (75 mg total) by mouth daily. 90 tablet 0  . Digestive Enzymes CAPS Take 1 capsule by mouth 3 (three) times daily with meals.    . NONFORMULARY OR COMPOUNDED ITEM Outpatient PT for CVA 1 each 0  . OVER THE COUNTER MEDICATION Take 1 capsule by mouth 3 (three) times daily with meals. Amazing Grape take two capsule 3  Times a day    . OVER THE COUNTER MEDICATION Take 2 capsules by mouth daily. Cardio Relax AO - Before Breakfast    . OVER THE COUNTER MEDICATION Take 2 capsules by mouth daily. GSH-3 Cell Defense- Nordic Clinical- Selenium 26mcn, N-Acetyl-LCystene NAC 600mg  & Blood Orange complex 100mg     . OVER THE COUNTER MEDICATION Take 1 tablet by mouth daily. Leedey Probiotic Extra Strength-10 billion    . OVER THE COUNTER MEDICATION Take 1 capsule by mouth 2 (two) times daily. Triple Strength EPA (647 mg) & DHA (253 mg) Fish Oil Omega 3    . OVER THE COUNTER MEDICATION Take 1 capsule by mouth 3 (three) times daily. Joint Performance Plus-Best Health Nutritionals    . OVER THE COUNTER MEDICATION Take 1 tablet by mouth 3 (three) times daily. Thytrophin PMG (Thyroid) 100mg - Standard Process    .  OVER THE COUNTER MEDICATION Take 1 tablet by mouth 2 (two) times daily. Prolamine Iodine (3mg )- Standard Process    . OVER THE COUNTER MEDICATION Take 3 tablets by mouth 3 (three) times daily. Drenamin (For Adrenals)- Standard Process    . OVER THE COUNTER MEDICATION Take 1 tablet by mouth 3 (three) times daily. Utrophin PMG (to replace uterus) - Standard Process    . OVER THE COUNTER MEDICATION Take 1 tablet by mouth 3 (three) times daily. Ovex (To replace ovaries) - Standard Process    . OVER THE COUNTER MEDICATION Take 1 capsule by mouth 3 (three) times daily. Ostivone (Lpriflavone-enhances calcium absorption) - Enzymatic Therapy    . OVER THE COUNTER  MEDICATION Take 1 capsule by mouth 2 (two) times daily. Billberry ExtractPlus- S&L Natural Foods    . OVER THE COUNTER MEDICATION Take 1 capsule by mouth daily. Magnesium Citrate 160mg - Life Extension    . OVER THE COUNTER MEDICATION Take 1 capsule by mouth daily. Zinc Picolinate (30mg ) - Natures Life    . OVER THE COUNTER MEDICATION Take 2 tablets by mouth daily. Vitamin D3- 2,000 IU Carlson's    . OVER THE COUNTER MEDICATION Take 1 capsule by mouth 2 (two) times daily. Vitamin C with Bioflavonoids- 500mg     . OVER THE COUNTER MEDICATION Take 1 tablet by mouth daily. [id number] Folic Acid (123XX123 mcg 123456 / 2mg  B6 / A999333 mcg folic Acid)    . OVER THE COUNTER MEDICATION Take 2 capsules by mouth at bedtime. LunaFlex PM- Valerian Root (200 mg), Perluxan Hops (500mg ) UC-11 Standardized collagen (50mg )    . OVER THE COUNTER MEDICATION 2.5 mLs by Other route See admin instructions. Natural Progesterone Creme- 1/4 tsp= 20 mg of natural progesterone. 5 days a week, skip sat and sun.(Rub on body)    . OVER THE COUNTER MEDICATION 1.5 drops See admin instructions. V-HP- BioActive Homeopathic- When bacteria is present    . OVER THE COUNTER MEDICATION Take by mouth 4 (four) times daily - after meals and at bedtime. Hydrate ii- Bio Actuve Homeopathic 6 drops in water 4 times a day for dehydration    . Red Yeast Rice Extract (RED YEAST RICE PO) Take by mouth. Take 2 capsule twice a day    . Turmeric Curcumin 500 MG CAPS     . UNABLE TO FIND cyruta plus take  2 tabs three times a dAY FOR STROKE PREVENTION    . UNABLE TO FIND PHYTOSTEROL FORMULA TAKE 2 CAPSULE TWO TIMES A DAY    . UNABLE TO FIND SPANISH BLACK RADISH TO ELIMINATE LIPITOR SIDE EFFECTS     No facility-administered medications prior to visit.     PAST MEDICAL Erica: Past Medical Erica:  Diagnosis Date  . Anxiety    h/o curious anxiety   . Arthritis    osteoporosis, ankylosing spondylosis   . Cancer ([place])    basal cell- facial   . Erica of  blood transfusion    for back surgery  . Osteoporosis   . Spondylitis, ankylosing ([place])   . Stroke [facility])     PAST SURGICAL Erica: Past Surgical Erica:  Procedure Laterality Date  . ABDOMINAL HYSTERECTOMY    . CRANIOPLASTY N/A 01/11/2014   Procedure: CRANIOPLASTY FROM FLAP IN ABDOMEN;  Surgeon: Kristeen Miss, MD;  Location: [place] NEURO ORS;  Service: Neurosurgery;  Laterality: N/A;  . CRANIOTOMY Right 10/06/2013   Procedure: CRANIECTOMY HEMATOMA EVACUATION SUBDURAL, PLACEMENT OF SKULL FLAP IN ABDOMEN;  Surgeon: Kristeen Miss, MD;  Location: [place] NEURO ORS;  Service: Neurosurgery;  Laterality: Right;  . EP IMPLANTABLE DEVICE N/A 11/05/2014   Procedure: Loop Recorder Insertion;  Surgeon: Will Meredith Leeds, MD;  Location: [place] CV LAB;  Service: Cardiovascular;  Laterality: N/A;  . HARDWARE REMOVAL N/A 10/23/2013   Procedure: HARDWARE REMOVAL;  Surgeon: Kristeen Miss, MD;  Location: [place] NEURO ORS;  Service: Neurosurgery;  Laterality: N/A;  HARDWARE REMOVAL  . POSTERIOR LUMBAR FUSION N/A 10/06/2013   Procedure: POSTERIOR LUMBAR FUSION  lumbar one/two;  Surgeon: Kristeen Miss, MD;  Location: [place] NEURO ORS;  Service: Neurosurgery;  Laterality: N/A;  . TEE WITHOUT CARDIOVERSION N/A 11/05/2014   Procedure: TRANSESOPHAGEAL ECHOCARDIOGRAM (TEE);  Surgeon: Thayer Headings, MD;  Location: [facility] ENDOSCOPY;  Service: Cardiovascular;  Laterality: N/A;    FAMILY Erica: Family Erica  Problem Relation Age of Onset  . Stroke Mother   . Stroke Father     SOCIAL Erica: Social Erica   Social Erica  . Marital status: Legally Separated    Spouse name: N/A  . Number of children: N/A  . Years of education: N/A   Occupational Erica  . Not on file.   Social Erica Main Topics  . Smoking status: Never Smoker  . Smokeless tobacco: Not on file  . Alcohol use No  . Drug use: No  . Sexual activity: Not on file   Other Topics Concern  . Not on file   Social Erica Narrative  . No narrative  on file     PHYSICAL EXAM  Vitals:   01/02/16 1250  BP: (!) 162/70  Pulse: 80  Weight: 132 lb 9.6 oz (60.1 kg)  Height: 4\' 10"  (1.473 m)   Body mass index is 27.71 kg/m. General: well developed, well nourished Pleasant elderly Caucasian lady, seated, in no evident distress Head: head normocephalic and atraumatic.  Neck: supple with no carotid bruits Cardiovascular: regular rate and rhythm, no murmurs Musculoskeletal: no deformity Skin: no rash/petichiae Vascular: Normal pulses all extremities   Neurological examination   Mentation:Awake and fully alert. Oriented to place and time. Recent and remote memory intact. Attention span, concentration and fund of knowledge appropriate. Mood and affect appropriate. Speech and language appear normal. Cranial Nerves: Pupils equal, briskly reactive to light. Extraocular movements full without nystagmus. Visual fields full to confrontation. Hearing intact. Facial sensation intact. Face, tongue, palate moves normally and symmetrically.  Motor: Normal bulk and tone. Normal strength in all tested extremity muscles. Sensory.: intact to touch ,pinprick .position and vibratory sensation in the upper and lower extremities  Coordination: Rapid alternating movements normal in all extremities. Finger-to-nose and heel-to-shin performed accurately bilaterally. Gait and Station: Arises from chair without difficulty. Stance is normal. Gait demonstrates normal stride length and balance . Able to heel, toe and tandem walk without difficulty. No assistive device Reflexes: 1+ and symmetric. Toes downgoing.  DIAGNOSTIC DATA (LABS, IMAGING, TESTING) - I reviewed patient records, labs, notes, testing and imaging myself where available.  Lab Results  Component Value Date   WBC 8.6 02/04/2015   HGB 14.9 02/04/2015   HCT 44.6 02/04/2015   MCV 88.1 02/04/2015   PLT 305 02/04/2015      Component Value Date/Time   NA 142 02/04/2015 1215   K 4.2  02/04/2015 1215   CL 106 02/04/2015 1215   CO2 27 02/04/2015 1215   GLUCOSE 125 (H) 02/04/2015 1215   BUN 15 02/04/2015 1215   CREATININE 0.68 02/04/2015 1215   CALCIUM 9.6 02/04/2015 1215  PROT 7.1 02/04/2015 1215   ALBUMIN 3.9 02/04/2015 1215   AST 28 02/04/2015 1215   ALT 31 02/04/2015 1215   ALKPHOS 62 02/04/2015 1215   BILITOT 0.6 02/04/2015 1215   GFRNONAA >60 02/04/2015 1215   GFRAA >60 02/04/2015 1215   Lab Results  Component Value Date   CHOL 171 02/05/2015   HDL 33 (L) 02/05/2015   LDLCALC 104 (H) 02/05/2015   TRIG 170 (H) 02/05/2015   CHOLHDL 5.2 02/05/2015   Lab Results  Component Value Date   HGBA1C 6.1 (H) 02/05/2015   No results found for: [id number] Lab Results  Component Value Date   TSH 2.300 11/03/2014      ASSESSMENT AND PLAN 29 year Caucasian lady with left MCA branch infarct in September [id number] of embolic etiology without definite identified source. Vascular risk factors of hypertension and intracranial atherosclerosis only. Recent readmission and December 2016 with right subcortical infarct.No further stroke or TIA symptoms since that time.Loop recorder has not shown evidence of atrial fibrillation  PLAN: Keep systolic blood pressure less than 130, today's reading  140/80  Continue taking blood pressure at home Lipids are followed by Dr. Redmond Pulling most recent LDL 79 Continue Plavix for  secondary stroke prevention Eat a healthy diet with plenty of whole grains, cereals, fruits and vegetables, exercise regularly and maintain ideal body weight.  No further stroke or TIA symptoms since December  2016 If recurrent stroke symptoms occur, call 911 and proceed to the hospital Discharge from neurologic services at this time Dennie Bible, [facility], [facility], [facility] [address] [place], [place] [address] [phone]

## 2016-01-02 NOTE — Progress Notes (Signed)
[facility] Summary Report / Loop Recorder 

## 2016-01-03 NOTE — Progress Notes (Signed)
I agree with the above plan 

## 2016-01-08 LAB — CUP PACEART REMOTE DEVICE CHECK
Date Time Interrogation Session: 20171005001020
Implantable Pulse Generator Implant Date: 20160909

## 2016-01-08 NOTE — Progress Notes (Signed)
Carelink summary report received. Battery status OK. Normal device function. No new symptom episodes, tachy episodes, brady, or pause episodes. No new AF episodes. Monthly summary reports and ROV/PRN 

## 2016-01-29 IMAGING — MR MR HEAD W/O CM
8 of 10 series · 37 of 48 positions shown · non-contrast
Comparison: MRI the brain 11/03/2014

CLINICAL DATA: Left-sided weakness following recent viral illness.
Gait instability. Difficulty ambulating. Personal history of CVA 3
months ago.

EXAM:
MRI HEAD WITHOUT CONTRAST
TECHNIQUE: Multiplanar, multiecho pulse sequences of the brain and surrounding
structures were obtained without intravenous contrast.

[Series 3: DWI · axial · 3.0mm · 1.09mm/px · z∈[-92,+44]mm · 9 of 94 slices shown (1 of 4)]
[im 1/94]
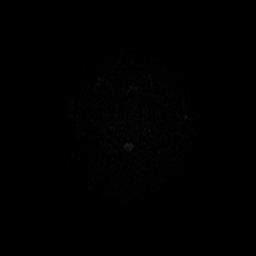
[im 17/94]
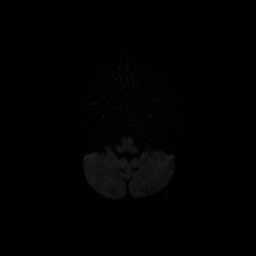
[im 26/94]
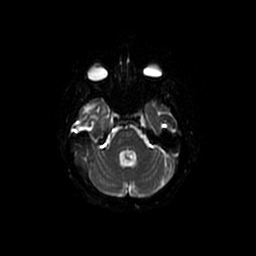
[im 43/94]
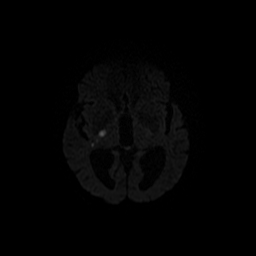
[im 51/94]
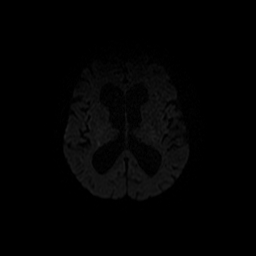
[im 68/94]
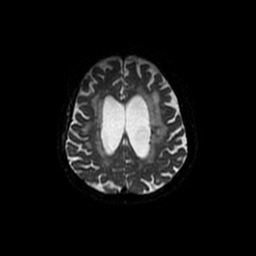
[im 77/94]
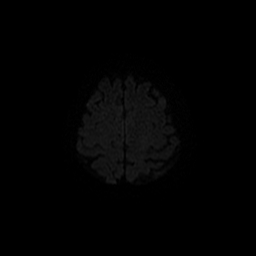
[im 85/94]
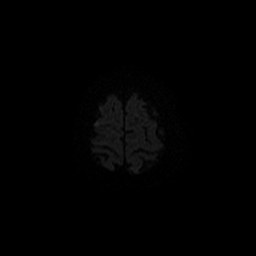
[im 94/94]
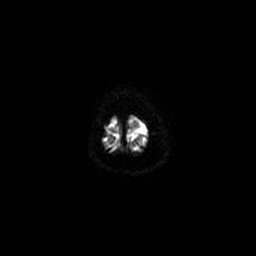

[Series 4: T2 · axial · 5.0mm · 0.43mm/px · z∈[-87,+50]mm · 3 of 24 slices shown (1 of 2)]
[im 1/24]
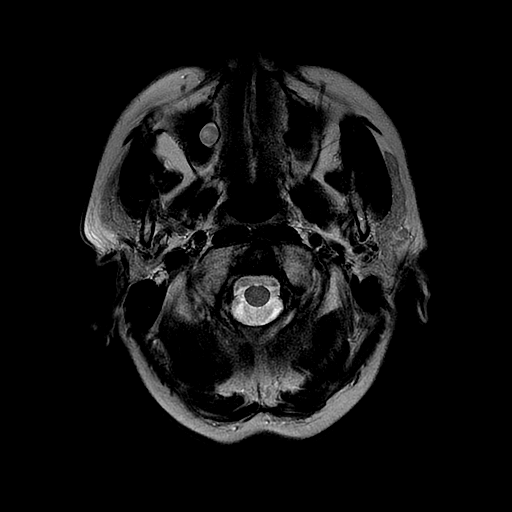
[im 12/24]
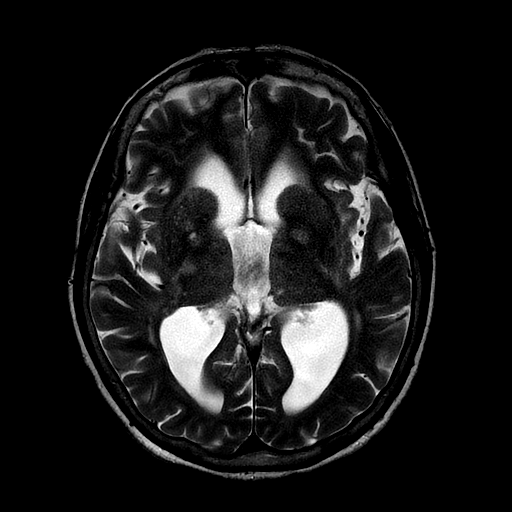
[im 24/24]
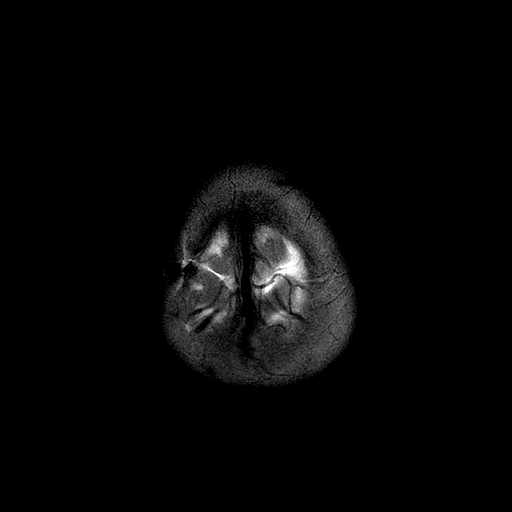

[Series 5: FLAIR · axial · 5.0mm · 0.43mm/px · z∈[-87,+50]mm · 3 of 24 slices shown (1 of 2)]
[im 1/24]
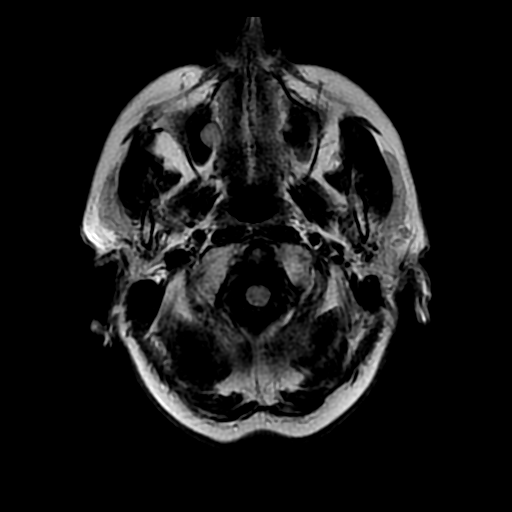
[im 12/24]
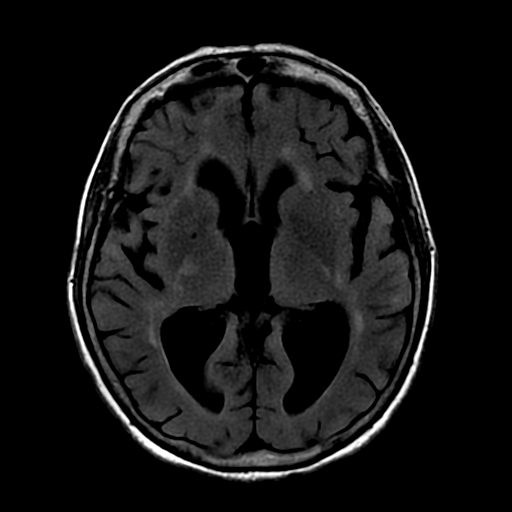
[im 24/24]
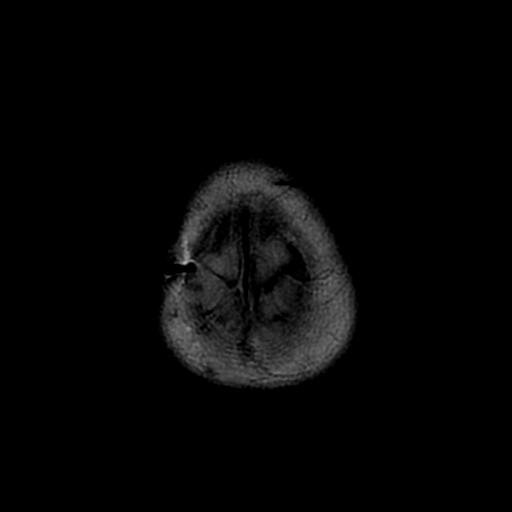

[Series 8: T2 · coronal · 5.0mm · 0.43mm/px · 1 of 28 slices shown (2 of 2)]
[im 1/28]
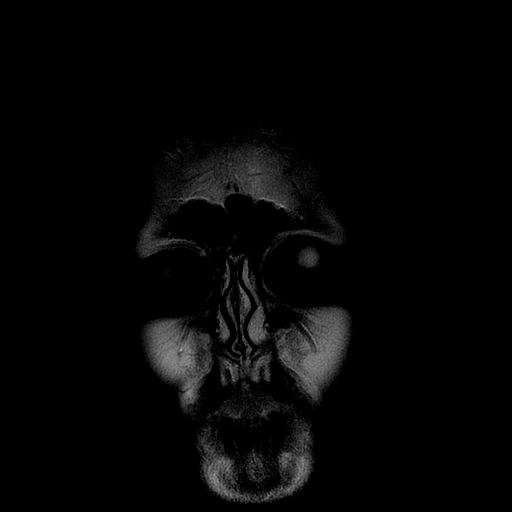

[Series 10: DWI · coronal · 5.0mm · 1.09mm/px · 8 of 66 slices shown (2 of 4)]
[im 1/66]
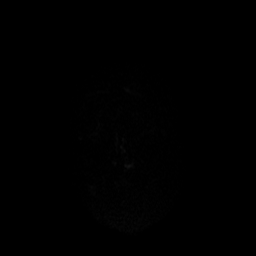
[im 10/66]
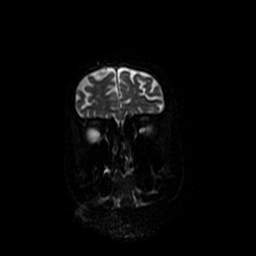
[im 19/66]
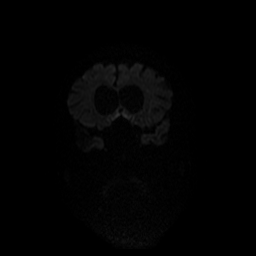
[im 28/66]
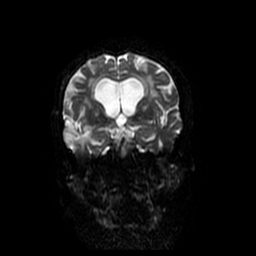
[im 38/66]
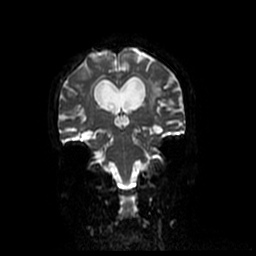
[im 47/66]
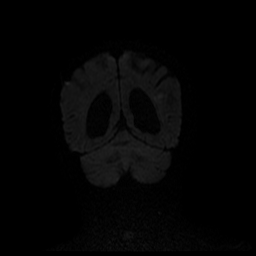
[im 56/66]
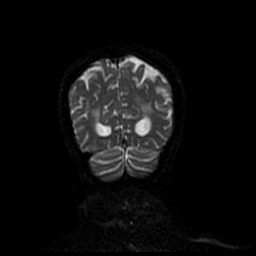
[im 66/66]
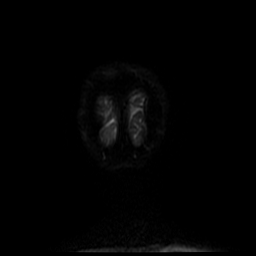

[Series 11: FLAIR · axial · 5.0mm · 0.43mm/px · z∈[-87,+50]mm · 3 of 24 slices shown (2 of 2)]
[im 1/24]
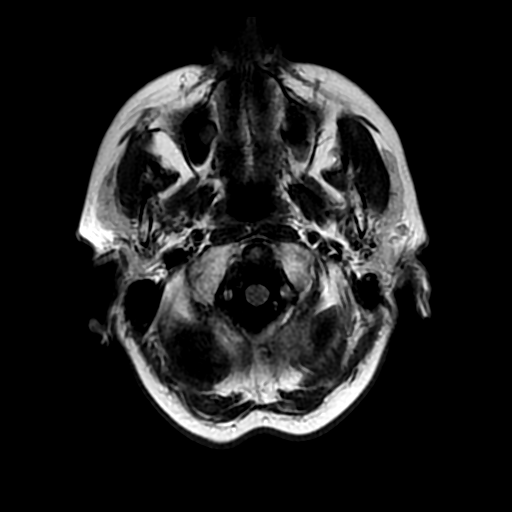
[im 12/24]
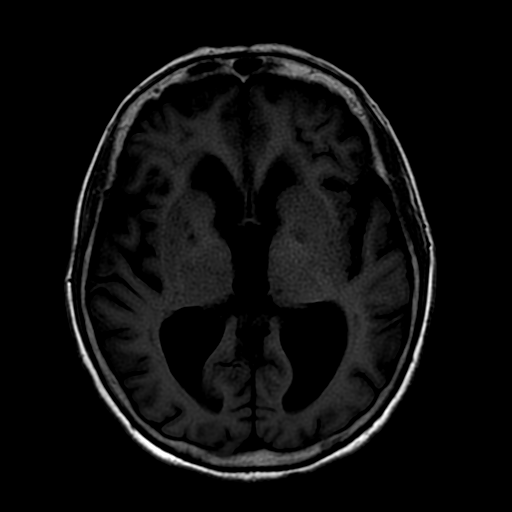
[im 24/24]
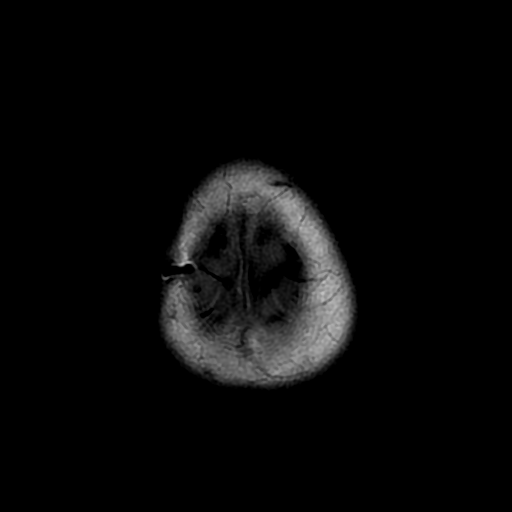

[Series 300: DWI · axial · 3.0mm · 1.09mm/px · z∈[-92,+44]mm · 6 of 47 slices shown (3 of 4)]
[im 1/47]
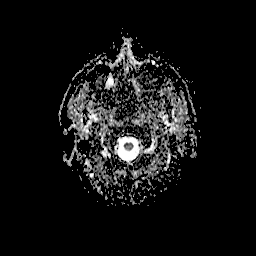
[im 10/47]
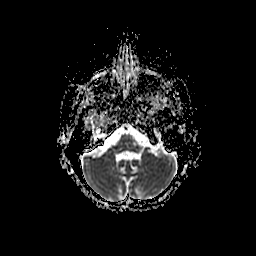
[im 19/47]
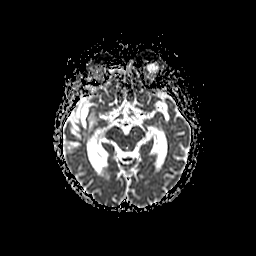
[im 28/47]
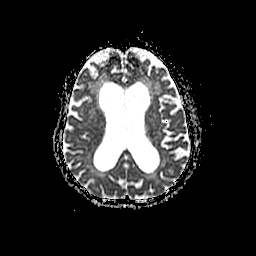
[im 37/47]
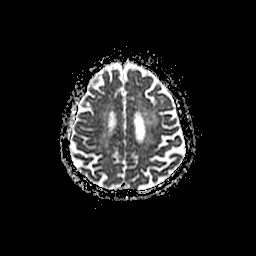
[im 47/47]
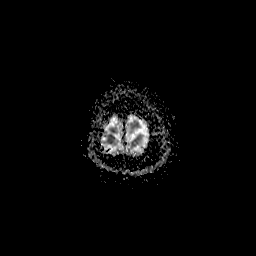

[Series 1000: DWI · coronal · 5.0mm · 1.09mm/px · 4 of 33 slices shown (4 of 4)]
[im 1/33]
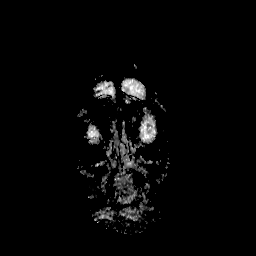
[im 11/33]
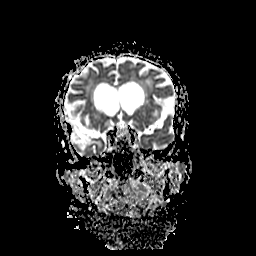
[im 22/33]
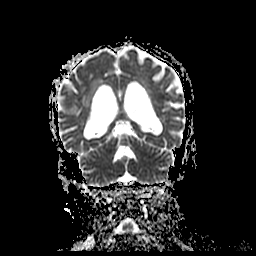
[im 33/33]
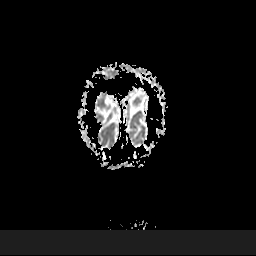

[37 of 48 positions shown; findings below may reference images not displayed]

FINDINGS: Acute nonhemorrhagic infarct is present within the posterior limb of
the right internal capsule and extending into the brainstem.

A diffuse left frontal and parietal white matter acute
nonhemorrhagic infarct represents progression of the previous
left-sided infarct.

There is slight progression of ventricular enlargement. The lateral
ventricles, third ventricle, and fourth ventricle are all enlarged.
Periventricular T2 changes are again seen. There is increased T2
change over the left centrum semiovale, corresponding to the area of
acute infarct. New matter changes are also present in the posterior
limb of the right internal capsule.

Remote lacunar infarcts of the basal ganglia bilaterally are
otherwise stable.

A right craniotomy is again noted.

The internal auditory canals are within normal limits bilaterally.
Flow is present in the major intracranial arteries.

The globes orbits are intact. Encephalomalacia of the anterior right
temporal tip and anterior inferior right frontal lobe are stable. A
polyp or mucous retention cyst is again noted in the right maxillary
sinus. An anterior right ethmoid air cell is now opacified.
IMPRESSION: 1. Acute nonhemorrhagic infarct involving the posterior limb right
internal capsule with extension into the brainstem.
2. Acute nonhemorrhagic infarcts in the left frontal and parietal
white matter consistent with expansion of the previously-seen
infarct.
3. Otherwise stable atrophy and diffuse white matter disease.
4. Remote encephalomalacia of the right temporal lobe.
5. Interval increase in ventriculomegaly. This raises concern for
communicating hydrocephalus.
These results were called by telephone at the time of interpretation
on 02/04/2015 at [DATE] to Dr. TSCHULIE HENNIGS , who verbally
acknowledged these results.

## 2016-01-30 ENCOUNTER — Ambulatory Visit (INDEPENDENT_AMBULATORY_CARE_PROVIDER_SITE_OTHER): Payer: Medicare HMO | Admitting: *Deleted

## 2016-01-30 DIAGNOSIS — I639 Cerebral infarction, unspecified: Secondary | ICD-10-CM | POA: Diagnosis not present

## 2016-01-30 NOTE — Progress Notes (Signed)
[facility] Summary Report / Loop Recorder 

## 2016-02-07 ENCOUNTER — Telehealth: Payer: Self-pay | Admitting: Cardiology

## 2016-02-07 NOTE — Telephone Encounter (Signed)
Pt had a question about billing. Directed pt to the billing department.

## 2016-02-08 LAB — CUP PACEART REMOTE DEVICE CHECK
Implantable Pulse Generator Implant Date: 20160909
MDC IDC SESS DTM: 20171104003904

## 2016-02-08 NOTE — Progress Notes (Signed)
Carelink summary report received. Battery status OK. Normal device function. No new symptom episodes, tachy episodes, brady, or pause episodes. No new AF episodes. Monthly summary reports and ROV/PRN 

## 2016-02-28 ENCOUNTER — Ambulatory Visit (INDEPENDENT_AMBULATORY_CARE_PROVIDER_SITE_OTHER): Payer: Medicare HMO | Admitting: *Deleted

## 2016-02-28 DIAGNOSIS — I639 Cerebral infarction, unspecified: Secondary | ICD-10-CM | POA: Diagnosis not present

## 2016-02-29 NOTE — Progress Notes (Signed)
[facility] Summary Report 

## 2016-03-08 LAB — CUP PACEART REMOTE DEVICE CHECK
MDC IDC PG IMPLANT DT: 20160909
MDC IDC SESS DTM: 20171204013608

## 2016-03-29 ENCOUNTER — Ambulatory Visit (INDEPENDENT_AMBULATORY_CARE_PROVIDER_SITE_OTHER): Payer: Medicare HMO | Admitting: *Deleted

## 2016-03-29 DIAGNOSIS — I639 Cerebral infarction, unspecified: Secondary | ICD-10-CM

## 2016-04-02 NOTE — Progress Notes (Signed)
[facility] Summary Report / Loop Recorder 

## 2016-04-09 LAB — CUP PACEART REMOTE DEVICE CHECK
Date Time Interrogation Session: 20180103024108
MDC IDC PG IMPLANT DT: 20160909

## 2016-04-27 LAB — CUP PACEART REMOTE DEVICE CHECK
Implantable Pulse Generator Implant Date: 20160909
MDC IDC SESS DTM: 20180202031102

## 2016-04-30 ENCOUNTER — Ambulatory Visit (INDEPENDENT_AMBULATORY_CARE_PROVIDER_SITE_OTHER): Payer: Medicare HMO | Admitting: *Deleted

## 2016-04-30 DIAGNOSIS — I639 Cerebral infarction, unspecified: Secondary | ICD-10-CM

## 2016-04-30 NOTE — Progress Notes (Signed)
[facility] Summary Report / Loop Recorder 

## 2016-05-12 LAB — CUP PACEART REMOTE DEVICE CHECK
Implantable Pulse Generator Implant Date: 20160909
MDC IDC SESS DTM: 20180304054257

## 2016-05-12 NOTE — Progress Notes (Signed)
Carelink summary report received. Battery status OK. Normal device function. No new symptom episodes, tachy episodes, brady, or pause episodes. No new AF episodes. Monthly summary reports and ROV/PRN 

## 2016-05-29 ENCOUNTER — Ambulatory Visit (INDEPENDENT_AMBULATORY_CARE_PROVIDER_SITE_OTHER): Payer: Medicare HMO | Admitting: *Deleted

## 2016-05-29 DIAGNOSIS — I639 Cerebral infarction, unspecified: Secondary | ICD-10-CM

## 2016-05-29 NOTE — Progress Notes (Signed)
[facility] Summary Report / Loop Recorder 

## 2016-06-04 LAB — CUP PACEART REMOTE DEVICE CHECK
Date Time Interrogation Session: 20180403060942
MDC IDC PG IMPLANT DT: 20160909

## 2016-06-04 NOTE — Progress Notes (Signed)
Carelink summary report received. Battery status OK. Normal device function. No new symptom episodes, tachy episodes, brady, or pause episodes. No new AF episodes. Monthly summary reports and ROV/PRN 

## 2016-06-28 ENCOUNTER — Ambulatory Visit (INDEPENDENT_AMBULATORY_CARE_PROVIDER_SITE_OTHER): Payer: Medicare HMO | Admitting: *Deleted

## 2016-06-28 DIAGNOSIS — I639 Cerebral infarction, unspecified: Secondary | ICD-10-CM

## 2016-06-28 NOTE — Progress Notes (Signed)
[facility] Summary Report / Loop Recorder 

## 2016-07-12 LAB — CUP PACEART REMOTE DEVICE CHECK
Date Time Interrogation Session: 20180503063819
Implantable Pulse Generator Implant Date: 20160909

## 2016-07-30 ENCOUNTER — Ambulatory Visit (INDEPENDENT_AMBULATORY_CARE_PROVIDER_SITE_OTHER): Payer: Medicare HMO | Admitting: *Deleted

## 2016-07-30 DIAGNOSIS — I639 Cerebral infarction, unspecified: Secondary | ICD-10-CM

## 2016-07-31 NOTE — Progress Notes (Signed)
[facility] Summary Report / Loop Recorder 

## 2016-08-01 LAB — CUP PACEART REMOTE DEVICE CHECK
Date Time Interrogation Session: 20180602063827
Implantable Pulse Generator Implant Date: 20160909

## 2016-08-27 ENCOUNTER — Ambulatory Visit (INDEPENDENT_AMBULATORY_CARE_PROVIDER_SITE_OTHER): Payer: Medicare HMO | Admitting: *Deleted

## 2016-08-27 DIAGNOSIS — I639 Cerebral infarction, unspecified: Secondary | ICD-10-CM | POA: Diagnosis not present

## 2016-08-27 NOTE — Progress Notes (Signed)
[facility] Summary Report / Loop Recorder 

## 2016-09-06 LAB — CUP PACEART REMOTE DEVICE CHECK
Implantable Pulse Generator Implant Date: 20160909
MDC IDC SESS DTM: 20180702114759

## 2016-09-26 ENCOUNTER — Ambulatory Visit (INDEPENDENT_AMBULATORY_CARE_PROVIDER_SITE_OTHER): Payer: Medicare HMO | Admitting: *Deleted

## 2016-09-26 DIAGNOSIS — I639 Cerebral infarction, unspecified: Secondary | ICD-10-CM | POA: Diagnosis not present

## 2016-09-26 NOTE — Progress Notes (Signed)
[facility] Summary Report / Loop Recorder 

## 2016-10-08 LAB — CUP PACEART REMOTE DEVICE CHECK
Implantable Pulse Generator Implant Date: 20160909
MDC IDC SESS DTM: 20180801144146

## 2016-10-26 ENCOUNTER — Ambulatory Visit (INDEPENDENT_AMBULATORY_CARE_PROVIDER_SITE_OTHER): Payer: Medicare HMO | Admitting: *Deleted

## 2016-10-26 DIAGNOSIS — I639 Cerebral infarction, unspecified: Secondary | ICD-10-CM | POA: Diagnosis not present

## 2016-10-26 NOTE — Progress Notes (Signed)
[facility] Summary Report / Loop Recorder 

## 2016-10-27 LAB — CUP PACEART REMOTE DEVICE CHECK
Implantable Pulse Generator Implant Date: 20160909
MDC IDC SESS DTM: 20180831154137

## 2016-10-27 NOTE — Progress Notes (Signed)
Carelink summary report received. Battery status OK. Normal device function. No new symptom episodes, tachy episodes, brady, or pause episodes. No new AF episodes. Monthly summary reports and ROV/PRN 

## 2016-11-26 ENCOUNTER — Ambulatory Visit (INDEPENDENT_AMBULATORY_CARE_PROVIDER_SITE_OTHER): Payer: Medicare HMO | Admitting: *Deleted

## 2016-11-26 DIAGNOSIS — I639 Cerebral infarction, unspecified: Secondary | ICD-10-CM | POA: Diagnosis not present

## 2016-11-26 NOTE — Progress Notes (Signed)
[facility] Summary Report / Loop Recorder 

## 2016-11-27 LAB — CUP PACEART REMOTE DEVICE CHECK
Implantable Pulse Generator Implant Date: 20160909
MDC IDC SESS DTM: 20180930181114

## 2016-12-25 ENCOUNTER — Ambulatory Visit (INDEPENDENT_AMBULATORY_CARE_PROVIDER_SITE_OTHER): Payer: Medicare HMO | Admitting: *Deleted

## 2016-12-25 DIAGNOSIS — I639 Cerebral infarction, unspecified: Secondary | ICD-10-CM

## 2016-12-27 NOTE — Progress Notes (Signed)
[facility] Summary Report / Loop Recorder 

## 2016-12-28 LAB — CUP PACEART REMOTE DEVICE CHECK
Date Time Interrogation Session: 20181030181120
Implantable Pulse Generator Implant Date: 20160909

## 2017-01-24 ENCOUNTER — Ambulatory Visit (INDEPENDENT_AMBULATORY_CARE_PROVIDER_SITE_OTHER): Payer: Medicare HMO | Admitting: *Deleted

## 2017-01-24 DIAGNOSIS — I639 Cerebral infarction, unspecified: Secondary | ICD-10-CM | POA: Diagnosis not present

## 2017-01-25 NOTE — Progress Notes (Signed)
[facility] Summary Report / Loop Recorder 

## 2017-02-07 LAB — CUP PACEART REMOTE DEVICE CHECK
Date Time Interrogation Session: 20181129184228
MDC IDC PG IMPLANT DT: 20160909

## 2017-02-25 ENCOUNTER — Ambulatory Visit (INDEPENDENT_AMBULATORY_CARE_PROVIDER_SITE_OTHER): Payer: Medicare HMO | Admitting: *Deleted

## 2017-02-25 DIAGNOSIS — I639 Cerebral infarction, unspecified: Secondary | ICD-10-CM | POA: Diagnosis not present

## 2017-02-25 NOTE — Progress Notes (Signed)
[facility] Summary Report / Loop Recorder 

## 2017-03-07 LAB — CUP PACEART REMOTE DEVICE CHECK
Implantable Pulse Generator Implant Date: 20160909
MDC IDC SESS DTM: 20181229190703

## 2017-03-25 ENCOUNTER — Ambulatory Visit (INDEPENDENT_AMBULATORY_CARE_PROVIDER_SITE_OTHER): Payer: Medicare HMO | Admitting: *Deleted

## 2017-03-25 DIAGNOSIS — I639 Cerebral infarction, unspecified: Secondary | ICD-10-CM | POA: Diagnosis not present

## 2017-03-26 NOTE — Progress Notes (Signed)
[facility] Summary Report / Loop Recorder 

## 2017-04-09 LAB — CUP PACEART REMOTE DEVICE CHECK
Date Time Interrogation Session: 20190128193843
MDC IDC PG IMPLANT DT: 20160909

## 2017-04-29 ENCOUNTER — Ambulatory Visit (INDEPENDENT_AMBULATORY_CARE_PROVIDER_SITE_OTHER): Payer: Medicare HMO | Admitting: *Deleted

## 2017-04-29 DIAGNOSIS — I639 Cerebral infarction, unspecified: Secondary | ICD-10-CM | POA: Diagnosis not present

## 2017-04-29 NOTE — Progress Notes (Signed)
[facility] Summary Report / Loop Recorder 

## 2017-05-30 ENCOUNTER — Ambulatory Visit (INDEPENDENT_AMBULATORY_CARE_PROVIDER_SITE_OTHER): Payer: Medicare HMO | Admitting: *Deleted

## 2017-05-30 DIAGNOSIS — I639 Cerebral infarction, unspecified: Secondary | ICD-10-CM | POA: Diagnosis not present

## 2017-06-03 NOTE — Progress Notes (Signed)
[facility] Summary Report / Loop Recorder 

## 2017-06-07 LAB — CUP PACEART REMOTE DEVICE CHECK
MDC IDC PG IMPLANT DT: 20160909
MDC IDC SESS DTM: 20190302193548

## 2017-07-02 ENCOUNTER — Ambulatory Visit (INDEPENDENT_AMBULATORY_CARE_PROVIDER_SITE_OTHER): Payer: Medicare HMO | Admitting: *Deleted

## 2017-07-02 DIAGNOSIS — I639 Cerebral infarction, unspecified: Secondary | ICD-10-CM

## 2017-07-03 LAB — CUP PACEART REMOTE DEVICE CHECK
Date Time Interrogation Session: 20190404203944
Implantable Pulse Generator Implant Date: 20160909

## 2017-07-03 NOTE — Progress Notes (Signed)
[facility] Summary Report / Loop Recorder 

## 2017-07-23 LAB — CUP PACEART REMOTE DEVICE CHECK
MDC IDC PG IMPLANT DT: 20160909
MDC IDC SESS DTM: 20190507203946

## 2017-08-05 ENCOUNTER — Ambulatory Visit (INDEPENDENT_AMBULATORY_CARE_PROVIDER_SITE_OTHER): Payer: Medicare HMO | Admitting: *Deleted

## 2017-08-05 DIAGNOSIS — I639 Cerebral infarction, unspecified: Secondary | ICD-10-CM | POA: Diagnosis not present

## 2017-08-05 NOTE — Progress Notes (Signed)
[facility] Summary Report / Loop Recorder 

## 2017-09-06 ENCOUNTER — Ambulatory Visit (INDEPENDENT_AMBULATORY_CARE_PROVIDER_SITE_OTHER): Payer: Medicare HMO | Admitting: *Deleted

## 2017-09-06 DIAGNOSIS — I639 Cerebral infarction, unspecified: Secondary | ICD-10-CM | POA: Diagnosis not present

## 2017-09-09 NOTE — Progress Notes (Signed)
[facility] Summary Report / Loop Recorder 

## 2017-09-10 LAB — CUP PACEART REMOTE DEVICE CHECK
Date Time Interrogation Session: 20190609214120
MDC IDC PG IMPLANT DT: 20160909

## 2017-10-09 ENCOUNTER — Ambulatory Visit (INDEPENDENT_AMBULATORY_CARE_PROVIDER_SITE_OTHER): Payer: Medicare HMO | Admitting: *Deleted

## 2017-10-09 DIAGNOSIS — I639 Cerebral infarction, unspecified: Secondary | ICD-10-CM | POA: Diagnosis not present

## 2017-10-10 NOTE — Progress Notes (Signed)
[facility] Summary Report / Loop Recorder 

## 2017-10-17 LAB — CUP PACEART REMOTE DEVICE CHECK
Implantable Pulse Generator Implant Date: 20160909
MDC IDC SESS DTM: 20190712220543

## 2017-11-11 ENCOUNTER — Ambulatory Visit (INDEPENDENT_AMBULATORY_CARE_PROVIDER_SITE_OTHER): Payer: Medicare HMO | Admitting: *Deleted

## 2017-11-11 DIAGNOSIS — I639 Cerebral infarction, unspecified: Secondary | ICD-10-CM

## 2017-11-12 NOTE — Progress Notes (Signed)
[facility] Summary Report / Loop Recorder 

## 2017-11-14 LAB — CUP PACEART REMOTE DEVICE CHECK
Implantable Pulse Generator Implant Date: 20160909
MDC IDC SESS DTM: 20190814221026

## 2017-11-24 LAB — CUP PACEART REMOTE DEVICE CHECK
Date Time Interrogation Session: 20190917004001
Implantable Pulse Generator Implant Date: 20160909

## 2017-11-29 ENCOUNTER — Telehealth: Payer: Self-pay | Admitting: Cardiology

## 2017-11-29 NOTE — Telephone Encounter (Signed)
Spoke with pt informed her that her [employer] [place] was still transmitting but was getting close to the end of its battery pt agreeable to keep with remote transmission until the [facility] reaches RRT and then discuss with WC regarding taking LINQ out or leaving it in.

## 2017-11-29 NOTE — Telephone Encounter (Signed)
° °  Patient wants to know if she can stop home remote checks. She states it has been 3 years and she feels it is unnecessary billing

## 2017-12-16 ENCOUNTER — Ambulatory Visit (INDEPENDENT_AMBULATORY_CARE_PROVIDER_SITE_OTHER): Payer: Medicare HMO | Admitting: *Deleted

## 2017-12-16 DIAGNOSIS — I639 Cerebral infarction, unspecified: Secondary | ICD-10-CM | POA: Diagnosis not present

## 2017-12-16 NOTE — Progress Notes (Signed)
[facility] Summary Report / Loop Recorder 

## 2018-01-04 LAB — CUP PACEART REMOTE DEVICE CHECK
Implantable Pulse Generator Implant Date: 20160909
MDC IDC SESS DTM: 20191020013944

## 2018-01-16 ENCOUNTER — Ambulatory Visit (INDEPENDENT_AMBULATORY_CARE_PROVIDER_SITE_OTHER): Payer: Medicare HMO

## 2018-01-16 DIAGNOSIS — I639 Cerebral infarction, unspecified: Secondary | ICD-10-CM | POA: Diagnosis not present

## 2018-01-17 NOTE — Progress Notes (Signed)
[facility] Summary Report / Loop Recorder 

## 2018-02-18 ENCOUNTER — Ambulatory Visit (INDEPENDENT_AMBULATORY_CARE_PROVIDER_SITE_OTHER): Payer: Medicare HMO

## 2018-02-18 DIAGNOSIS — I639 Cerebral infarction, unspecified: Secondary | ICD-10-CM

## 2018-02-19 LAB — CUP PACEART REMOTE DEVICE CHECK
Date Time Interrogation Session: 20191225031016
Implantable Pulse Generator Implant Date: 20160909

## 2018-02-20 NOTE — Progress Notes (Signed)
[facility] Summary Report / Loop Recorder 

## 2018-03-02 LAB — CUP PACEART REMOTE DEVICE CHECK
MDC IDC PG IMPLANT DT: 20160909
MDC IDC SESS DTM: 20191122023947

## 2018-03-07 ENCOUNTER — Telehealth: Payer: Self-pay | Admitting: Cardiology

## 2018-03-07 NOTE — Telephone Encounter (Signed)
Patient calling w/ questions about her loop recorder battery life. I explained to patient that typically the battery will last for 3 years but it can last longer. Once the battery reached RRT we will call her and let her know and that is when the billing will stop. Pt verbalized understanding .

## 2018-03-24 ENCOUNTER — Ambulatory Visit (INDEPENDENT_AMBULATORY_CARE_PROVIDER_SITE_OTHER): Payer: Medicare HMO

## 2018-03-24 DIAGNOSIS — I639 Cerebral infarction, unspecified: Secondary | ICD-10-CM | POA: Diagnosis not present

## 2018-03-25 LAB — CUP PACEART REMOTE DEVICE CHECK
Date Time Interrogation Session: 20200127030839
MDC IDC PG IMPLANT DT: 20160909

## 2018-03-25 NOTE — Progress Notes (Signed)
[facility] Summary Report / Loop Recorder 

## 2018-04-21 ENCOUNTER — Telehealth: Payer: Self-pay

## 2018-04-21 NOTE — Telephone Encounter (Signed)
I left a message because the pt has reached RRT.

## 2018-04-22 NOTE — Telephone Encounter (Signed)
Spoke w/ pt and informed her that her ILR has reached RRT. Informed her of her options. She stated that it is not bothering her so she will leave it in. Informed her that we will send her a return kit and she can send the monitor back to [employer]. Pt verbalized understanding.

## 2018-04-25 ENCOUNTER — Encounter: Payer: Medicare HMO | Admitting: *Deleted

## 2018-05-14 ENCOUNTER — Other Ambulatory Visit: Payer: Self-pay | Admitting: Internal Medicine

## 2020-02-17 ENCOUNTER — Other Ambulatory Visit: Payer: Self-pay | Admitting: Infectious Diseases

## 2020-02-17 ENCOUNTER — Ambulatory Visit (HOSPITAL_COMMUNITY)
Admission: RE | Admit: 2020-02-17 | Discharge: 2020-02-17 | Disposition: A | Discharge status: Home / Self Care | Payer: Medicare Other | Source: Ambulatory Visit | Attending: Pulmonary Disease | Admitting: Pulmonary Disease

## 2020-02-17 ENCOUNTER — Telehealth: Payer: Self-pay | Admitting: Infectious Diseases

## 2020-02-17 DIAGNOSIS — G459 Transient cerebral ischemic attack, unspecified: Secondary | ICD-10-CM

## 2020-02-17 DIAGNOSIS — Z23 Encounter for immunization: Secondary | ICD-10-CM | POA: Insufficient documentation

## 2020-02-17 DIAGNOSIS — I10 Essential (primary) hypertension: Secondary | ICD-10-CM

## 2020-02-17 DIAGNOSIS — U071 COVID-19: Secondary | ICD-10-CM | POA: Diagnosis present

## 2020-02-17 MED ORDER — SODIUM CHLORIDE 0.9 % IV SOLN: INTRAVENOUS | Status: DC | PRN

## 2020-02-17 MED ORDER — SODIUM CHLORIDE 0.9 % IV SOLN: Freq: Once | INTRAVENOUS | Status: AC

## 2020-02-17 MED ORDER — METHYLPREDNISOLONE SODIUM SUCC 125 MG IJ SOLR: 125.0000 mg | Freq: Once | INTRAMUSCULAR | Status: DC | PRN

## 2020-02-17 MED ORDER — ALBUTEROL SULFATE HFA 108 (90 BASE) MCG/ACT IN AERS: 2.0000 | INHALATION_SPRAY | Freq: Once | RESPIRATORY_TRACT | Status: DC | PRN

## 2020-02-17 MED ORDER — EPINEPHRINE 0.3 MG/0.3ML IJ SOAJ: 0.3000 mg | Freq: Once | INTRAMUSCULAR | Status: DC | PRN

## 2020-02-17 MED ORDER — DIPHENHYDRAMINE HCL 50 MG/ML IJ SOLN: 50.0000 mg | Freq: Once | INTRAMUSCULAR | Status: DC | PRN

## 2020-02-17 MED ORDER — FAMOTIDINE IN NACL 20-0.9 MG/50ML-% IV SOLN: 20.0000 mg | Freq: Once | INTRAVENOUS | Status: DC | PRN

## 2020-02-17 NOTE — Progress Notes (Signed)
I connected by phone with Erica [name] on 02/17/2020 at 10:09 AM to discuss the potential use of a new treatment for mild to moderate COVID-19 viral infection in non-hospitalized patients.  This patient is a 86 y.o. female that meets the FDA criteria for Emergency Use Authorization of COVID monoclonal antibody casirivimab/imdevimab, bamlanivimab/etesevimab, or sotrovimab.  Has a (+) direct SARS-CoV-2 viral test result  Has mild or moderate COVID-19   Is NOT hospitalized due to COVID-19  Is within 10 days of symptom onset  Has at least one of the high risk factor(s) for progression to severe COVID-19 and/or hospitalization as defined in EUA.  Specific high risk criteria : Older age (>/= 84 yo) and Cardiovascular disease or hypertension   I have spoken and communicated the following to the patient or parent/caregiver regarding COVID monoclonal antibody treatment:  1. FDA has authorized the emergency use for the treatment of mild to moderate COVID-19 in adults and pediatric patients with positive results of direct SARS-CoV-2 viral testing who are 44 years of age and older weighing at least 40 kg, and who are at high risk for progressing to severe COVID-19 and/or hospitalization.  2. The significant known and potential risks and benefits of COVID monoclonal antibody, and the extent to which such potential risks and benefits are unknown.  3. Information on available alternative treatments and the risks and benefits of those alternatives, including clinical trials.  4. Patients treated with COVID monoclonal antibody should continue to self-isolate and use infection control measures (e.g., wear mask, isolate, social distance, avoid sharing personal items, clean and disinfect high touch surfaces, and frequent handwashing) according to CDC guidelines.   5. The patient or parent/caregiver has the option to accept or refuse COVID monoclonal antibody treatment.  After reviewing this information  with the patient, the patient has agreed to receive one of the available covid 19 monoclonal antibodies and will be provided an appropriate fact sheet prior to infusion. Erica Madeira, NP 02/17/2020 10:09 AM

## 2020-02-17 NOTE — Progress Notes (Signed)
Spoke with Belenda Cruise, NP regarding pt's BP.  Non-symptomatic and on BP meds.  Per Belenda Cruise, advised pt and daughter to take BP meds and s/s to watch for.  Pt and daughter acknowledged.

## 2020-02-17 NOTE — Progress Notes (Signed)
  Diagnosis: COVID-19  Physician: Dr. Joya Gaskins  Procedure: [facility] Infusion [facility] Med: bamlanivimab\etesevimab infusion - Provided patient with bamlanimivab\etesevimab fact sheet for patients, parents and caregivers prior to infusion.  Complications: No immediate complications noted.  Discharge: Discharged home   [place] 02/17/2020

## 2020-02-17 NOTE — Progress Notes (Signed)
Patient reviewed Fact Sheet for Patients, Parents, and Caregivers for Emergency Use Authorization (EUA) of bamlanivimab and etesevimab for the Treatment of Coronavirus. Patient also reviewed and is agreeable to the estimated cost of treatment. Patient is agreeable to proceed.   

## 2020-02-17 NOTE — Telephone Encounter (Signed)
Called to Discuss with patient about Covid symptoms and the use of the monoclonal antibody infusion for those with mild to moderate Covid symptoms and at a high risk of hospitalization.     Pt appears to qualify for this infusion due to co-morbid conditions and/or a member of an at-risk group in accordance with the FDA Emergency Use Authorization.      Qualified for Infusion due to age, diabetes.   Symptoms first started 12/16.    Janene Madeira, MSN, NP-C [email or web address] Pager: [phone] Office: [phone] [place]: [phone]

## 2020-02-17 NOTE — Discharge Instructions (Signed)
10 Things You Can Do to Manage Your COVID-19 Symptoms at Home If you have possible or confirmed COVID-19: 1. Stay home from work and school. And stay away from other public places. If you must go out, avoid using any kind of public transportation, ridesharing, or taxis. 2. Monitor your symptoms carefully. If your symptoms get worse, call your healthcare provider immediately. 3. Get rest and stay hydrated. 4. If you have a medical appointment, call the healthcare provider ahead of time and tell them that you have or may have COVID-19. 5. For medical emergencies, call 911 and notify the dispatch personnel that you have or may have COVID-19. 6. Cover your cough and sneezes with a tissue or use the inside of your elbow. 7. Wash your hands often with soap and water for at least 20 seconds or clean your hands with an alcohol-based hand sanitizer that contains at least 60% alcohol. 8. As much as possible, stay in a specific room and away from other people in your home. Also, you should use a separate bathroom, if available. If you need to be around other people in or outside of the home, wear a mask. 9. Avoid sharing personal items with other people in your household, like dishes, towels, and bedding. 10. Clean all surfaces that are touched often, like counters, tabletops, and doorknobs. Use household cleaning sprays or wipes according to the label instructions. cdc.gov/coronavirus 08/27/2018 This information is not intended to replace advice given to you by your health care provider. Make sure you discuss any questions you have with your health care provider. Document Revised: 01/29/2019 Document Reviewed: 01/29/2019 Elsevier Patient Education  2020 [employer] What types of side effects do monoclonal antibody drugs cause?  Common side effects  In general, the more common side effects caused by monoclonal antibody drugs include: . Allergic reactions, such as hives or itching . Flu-like signs and  symptoms, including chills, fatigue, fever, and muscle aches and pains . Nausea, vomiting . Diarrhea . Skin rashes . Low blood pressure   The CDC is recommending patients who receive monoclonal antibody treatments wait at least 90 days before being vaccinated.  Currently, there are no data on the safety and efficacy of mRNA COVID-19 vaccines in persons who received monoclonal antibodies or convalescent plasma as part of COVID-19 treatment. Based on the estimated half-life of such therapies as well as evidence suggesting that reinfection is uncommon in the 90 days after initial infection, vaccination should be deferred for at least 90 days, as a precautionary measure until additional information becomes available, to avoid interference of the antibody treatment with vaccine-induced immune responses. If you have any questions or concerns after the infusion please call the Advanced Practice Provider on call at [phone]. This number is ONLY intended for your use regarding questions or concerns about the infusion post-treatment side-effects.  Please do not provide this number to others for use. For return to work notes please contact your primary care provider.   If someone you know is interested in receiving treatment please have them call the COVID hotline at [phone].   

## 2022-12-13 ENCOUNTER — Telehealth: Payer: Self-pay

## 2022-12-13 NOTE — Telephone Encounter (Signed)
Care everywhere search
